# Patient Record
Sex: Female | Born: 1995 | Race: White | Hispanic: No | Marital: Single | Smoking: Never smoker
Health system: Southern US, Community
[De-identification: ages and names within clinical notes are randomized; demographics above are authoritative.]

## PROBLEM LIST (undated history)

## (undated) ENCOUNTER — Ambulatory Visit (HOSPITAL_COMMUNITY): Admission: EM | Payer: Self-pay

## (undated) DIAGNOSIS — N946 Dysmenorrhea, unspecified: Secondary | ICD-10-CM

## (undated) DIAGNOSIS — E669 Obesity, unspecified: Secondary | ICD-10-CM

## (undated) DIAGNOSIS — F909 Attention-deficit hyperactivity disorder, unspecified type: Secondary | ICD-10-CM

## (undated) DIAGNOSIS — Z975 Presence of (intrauterine) contraceptive device: Secondary | ICD-10-CM

## (undated) DIAGNOSIS — F419 Anxiety disorder, unspecified: Secondary | ICD-10-CM

## (undated) DIAGNOSIS — R61 Generalized hyperhidrosis: Secondary | ICD-10-CM

## (undated) DIAGNOSIS — F429 Obsessive-compulsive disorder, unspecified: Secondary | ICD-10-CM

## (undated) HISTORY — DX: Obesity, unspecified: E66.9

## (undated) HISTORY — DX: Anxiety disorder, unspecified: F41.9

## (undated) HISTORY — DX: Presence of (intrauterine) contraceptive device: Z97.5

## (undated) HISTORY — DX: Attention-deficit hyperactivity disorder, unspecified type: F90.9

## (undated) HISTORY — DX: Dysmenorrhea, unspecified: N94.6

## (undated) HISTORY — PX: BREAST SURGERY: SHX581

## (undated) HISTORY — DX: Generalized hyperhidrosis: R61

---
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
## 2017-07-12 DIAGNOSIS — R059 Cough, unspecified: Secondary | ICD-10-CM | POA: Insufficient documentation

## 2017-07-12 DIAGNOSIS — J0100 Acute maxillary sinusitis, unspecified: Secondary | ICD-10-CM | POA: Insufficient documentation

## 2017-07-12 HISTORY — DX: Acute maxillary sinusitis, unspecified: J01.00

## 2017-07-12 HISTORY — DX: Cough, unspecified: R05.9

## 2018-12-07 DIAGNOSIS — F419 Anxiety disorder, unspecified: Secondary | ICD-10-CM | POA: Insufficient documentation

## 2018-12-07 DIAGNOSIS — F902 Attention-deficit hyperactivity disorder, combined type: Secondary | ICD-10-CM | POA: Insufficient documentation

## 2019-06-14 ENCOUNTER — Other Ambulatory Visit: Payer: Self-pay

## 2019-06-14 ENCOUNTER — Ambulatory Visit (INDEPENDENT_AMBULATORY_CARE_PROVIDER_SITE_OTHER): Payer: 59 | Admitting: Psychiatry

## 2019-06-14 ENCOUNTER — Encounter: Payer: Self-pay | Admitting: Psychiatry

## 2019-06-14 VITALS — BP 122/90 | HR 84 | Ht 66.0 in | Wt 210.0 lb

## 2019-06-14 DIAGNOSIS — F902 Attention-deficit hyperactivity disorder, combined type: Secondary | ICD-10-CM | POA: Diagnosis not present

## 2019-06-14 DIAGNOSIS — F605 Obsessive-compulsive personality disorder: Secondary | ICD-10-CM

## 2019-06-14 DIAGNOSIS — F410 Panic disorder [episodic paroxysmal anxiety] without agoraphobia: Secondary | ICD-10-CM

## 2019-06-14 DIAGNOSIS — F411 Generalized anxiety disorder: Secondary | ICD-10-CM | POA: Diagnosis not present

## 2019-06-14 DIAGNOSIS — F429 Obsessive-compulsive disorder, unspecified: Secondary | ICD-10-CM | POA: Insufficient documentation

## 2019-06-14 MED ORDER — AMPHETAMINE-DEXTROAMPHETAMINE 20 MG PO TABS: 20.0000 mg | ORAL_TABLET | Freq: Every day | ORAL | 0 refills | Status: DC

## 2019-06-14 MED ORDER — ALPRAZOLAM 1 MG PO TABS: 1.0000 mg | ORAL_TABLET | Freq: Two times a day (BID) | ORAL | 1 refills | Status: DC | PRN

## 2019-06-14 MED ORDER — AMPHETAMINE-DEXTROAMPHET ER 25 MG PO CP24: 50.0000 mg | ORAL_CAPSULE | Freq: Every day | ORAL | 0 refills | Status: DC

## 2019-06-14 MED ORDER — FLUOXETINE HCL 40 MG PO CAPS: 80.0000 mg | ORAL_CAPSULE | Freq: Every day | ORAL | 2 refills | Status: DC
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
## 2019-09-14 ENCOUNTER — Other Ambulatory Visit: Payer: Self-pay

## 2019-09-14 ENCOUNTER — Encounter: Payer: Self-pay | Admitting: Psychiatry

## 2019-09-14 ENCOUNTER — Ambulatory Visit (INDEPENDENT_AMBULATORY_CARE_PROVIDER_SITE_OTHER): Payer: 59 | Admitting: Psychiatry

## 2019-09-14 VITALS — Ht 66.0 in | Wt 199.0 lb

## 2019-09-14 DIAGNOSIS — F410 Panic disorder [episodic paroxysmal anxiety] without agoraphobia: Secondary | ICD-10-CM | POA: Diagnosis not present

## 2019-09-14 DIAGNOSIS — F411 Generalized anxiety disorder: Secondary | ICD-10-CM

## 2019-09-14 DIAGNOSIS — F605 Obsessive-compulsive personality disorder: Secondary | ICD-10-CM

## 2019-09-14 DIAGNOSIS — F902 Attention-deficit hyperactivity disorder, combined type: Secondary | ICD-10-CM | POA: Diagnosis not present

## 2019-09-14 MED ORDER — AMPHETAMINE-DEXTROAMPHET ER 25 MG PO CP24: 50.0000 mg | ORAL_CAPSULE | Freq: Every day | ORAL | 0 refills | Status: DC

## 2019-09-14 MED ORDER — ALPRAZOLAM 1 MG PO TABS: 1.0000 mg | ORAL_TABLET | Freq: Two times a day (BID) | ORAL | 1 refills | Status: DC | PRN

## 2019-09-14 MED ORDER — AMPHETAMINE-DEXTROAMPHETAMINE 20 MG PO TABS: 20.0000 mg | ORAL_TABLET | Freq: Every day | ORAL | 0 refills | Status: DC

## 2019-09-14 MED ORDER — FLUOXETINE HCL 40 MG PO CAPS: 80.0000 mg | ORAL_CAPSULE | Freq: Every day | ORAL | 2 refills | Status: DC

## 2019-09-14 NOTE — Progress Notes (Signed)
[facility]  Patient ID: Donisha Hoch,  MRN: [id number]  PCP: Patient, No Pcp Per  Date of Evaluation: 09/14/2019 Time spent:20 minutes from 0905 to 0925  Chief Complaint:  Chief Complaint    Anxiety; Panic Attack; ADHD      HISTORY/CURRENT STATUS: Marlenne is seen onsite in office 20 minutes face-to-face individually with consent with epic collateral for psychiatric interview and exam in 62-month evaluation and management of generalized anxiety, ADHD/OCP, and panic disorder.  The patient's last appointment was very focused upon discomfort with change from previous treatment particularly her therapist.  Though she has not had a virtual session with her therapist in [place] in over a year, she still feels guilty that she should reestablish that therapy as an extension of her past rather than moving ahead.  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  She has tears as she discusses looking into therapy again stating she should do so but has been busy and felt quite well in the last 3 months. Parents in [place] are now more but modestly stressed by the patient's pharmacist sister older by 5 years moving to [place] near husband's family to start a family of her own.  The patient doubts she will have a serious relationship in 5 years.  She did not bring the 2015 psychological testing she expected to integrate into her care here from last session which is likely therapeutic even though having the information is always more complete.  She had a viral gastroenteritis for 1 week with 11 pound weight loss.  She is taking her medication regularly with [facility] documenting last dispensing on 08/28/2019 so that she has filled all of her descriptions from last appointment though she has cut down on Xanax not acknowledging that will please  her sister the pharmacist.  She has no mania, suicidality, psychosis or delirium.   Anxiety Presents for initial visit. Onset was more than 5 years ago. The problem has been waxing and waning. Symptoms include confusion, decreased concentration, excessive worry, muscle tension, nervous/anxious behavior, palpitations, panic and restlessness. Patient reports no depressed mood or suicidal ideas. Symptoms occur most days. The severity of symptoms is causing significant distress and moderate. The symptoms are aggravated by work stress, family issues and social activities. The quality of sleep is fair. Nighttime awakenings: occasional.   Risk factors include change in medication, prior traumatic experience, a major life event and family history. Her past medical history is significant for anxiety/panic attacks. There is no history of arrhythmia, bipolar disorder, hyperthyroidism or suicide attempts. Past treatments include benzodiazephines and SSRIs. The treatment provided mild relief. Compliance with prior treatments has been good. Prior compliance problems include medication issues and difficulty with treatment plan.    Individual Medical History/ Review of Systems: Changes? :Yes Weight dropped 11 pounds with vomiting and diarrhea for a week considered viral.  She is taking less Xanax than she thinks she might need to take but getting BiPAP  Allergies: Clarithromycin and Penicillins  Current Medications:  Current Outpatient Medications:  .  ALPRAZolam (XANAX) 1 MG tablet, Take 1 tablet (1 mg total) by mouth 2 (two) times daily as needed for anxiety (Panic)., Disp: 60 tablet, Rfl: 1 .  [START ON 09/27/2019] amphetamine-dextroamphetamine (ADDERALL XR) 25 MG 24 hr capsule, Take 2 capsules by mouth daily after breakfast., Disp: 60 capsule, Rfl:  0 .  [START ON 10/27/2019] amphetamine-dextroamphetamine (ADDERALL XR) 25 MG 24 hr capsule, Take 2 capsules by mouth daily after breakfast., Disp: 60 capsule, Rfl: 0 .   [START ON 11/26/2019] amphetamine-dextroamphetamine (ADDERALL XR) 25 MG 24 hr capsule, Take 2 capsules by mouth daily after breakfast., Disp: 60 capsule, Rfl: 0 .  [START ON 09/27/2019] amphetamine-dextroamphetamine (ADDERALL) 20 MG tablet, Take 1 tablet (20 mg total) by mouth daily before supper., Disp: 30 tablet, Rfl: 0 .  [START ON 10/27/2019] amphetamine-dextroamphetamine (ADDERALL) 20 MG tablet, Take 1 tablet (20 mg total) by mouth daily before supper., Disp: 30 tablet, Rfl: 0 .  [START ON 11/26/2019] amphetamine-dextroamphetamine (ADDERALL) 20 MG tablet, Take 1 tablet (20 mg total) by mouth daily before supper., Disp: 30 tablet, Rfl: 0 .  FLUoxetine (PROZAC) 40 MG capsule, Take 2 capsules (80 mg total) by mouth daily after breakfast., Disp: 60 capsule, Rfl: 2 .  Multiple Vitamin (MULTI-VITAMIN) tablet, Take by mouth., Disp: , Rfl:   Medication Side Effects: hypersomnolence  Family Medical/ Social History: Changes? No  MENTAL HEALTH EXAM:  Height 5\' 6"  (1.676 m), weight 199 lb (90.3 kg).Body mass index is 32.12 kg/m. Muscle strengths and tone 5/5, postural reflexes and gait 0/0, and AIMS = 0 otherwise deferred for coronavirus  General Appearance: Fairly Groomed, Guarded, Meticulous and Obese  Eye Contact:  Fair  Speech:  Clear and Coherent, Normal Rate and Talkative  Volume:  Normal  Mood:  Anxious, Dysphoric, Euthymic and Worthless  Affect:  Congruent, Inappropriate, Restricted and Anxious  Thought Process:  Coherent, Goal Directed, Irrelevant, Linear and Descriptions of Associations: Circumstantial  Orientation:  Full (Time, Place, and Person)  Thought Content: Ilusions, Obsessions and Rumination   Suicidal Thoughts:  No  Homicidal Thoughts:  No  Memory:  Immediate;   Good Remote;   Good  Judgement:  Fair  Insight:  Fair to limited  Psychomotor Activity:  Normal, Increased, Mannerisms and Restlessness  Concentration:  Concentration: Fair and Attention Span: Fair   Recall:  Good  Fund  of Knowledge: Good  Language: Good  Assets:  Leisure Time Resilience Talents/Skills  ADL's:  Intact  Cognition: WNL  Prognosis:  Good    DIAGNOSES:    ICD-10-CM   1. Generalized anxiety disorder  F41.1 FLUoxetine (PROZAC) 40 MG capsule    ALPRAZolam (XANAX) 1 MG tablet  2. Attention deficit hyperactivity disorder (ADHD), combined type, moderate  F90.2 amphetamine-dextroamphetamine (ADDERALL XR) 25 MG 24 hr capsule    amphetamine-dextroamphetamine (ADDERALL XR) 25 MG 24 hr capsule    amphetamine-dextroamphetamine (ADDERALL XR) 25 MG 24 hr capsule    amphetamine-dextroamphetamine (ADDERALL) 20 MG tablet    amphetamine-dextroamphetamine (ADDERALL) 20 MG tablet    amphetamine-dextroamphetamine (ADDERALL) 20 MG tablet  3. Panic disorder  F41.0 FLUoxetine (PROZAC) 40 MG capsule    ALPRAZolam (XANAX) 1 MG tablet  4. Obsessive compulsive personality disorder ([place])  F60.5 FLUoxetine (PROZAC) 40 MG capsule    Receiving Psychotherapy: No but agreeing to start whether with her former therapist in [place] or with Shanon Ace, LCSW here   RECOMMENDATIONS: Closure from past fixations is encouraged as adaptive moving forward and adult life is described.  The patient is doing quite well with work and adapting to the area, she doubts this can become fulfilling even over several years as she had observed such happening for her older sister including through her parents' eyes.  Maintaining current medication improvement of the last 3 months requiring less Xanax though still obtaining refill warrants continuing  as she plans to start therapy.Prozac is E scribed 40 mg capsule every morning after breakfast as #60 with 2 refills to [facility] [address] for generalized and panic anxiety and obsessive-compulsive.  She is E scribed Xanax 1 mg twice daily as needed for panic #60 with 1 refill also sent to [facility] for panic disorder and generalized anxiety.  She is E scribed Adderall 25 mg  XR every morning and 20 mg IR every mid-to-late afternoon has #30 each for March 8, April 7, and May 7 sent to [facility] for ADHD.  She returns for follow-up in 3 months or sooner if needed.   Chauncey Mann, MD

## 2019-10-11 ENCOUNTER — Telehealth: Payer: Self-pay

## 2019-10-11 NOTE — Telephone Encounter (Signed)
Contacted [facility] at [phone] for a prior authorization for ADDERALL XR 25 MG 2 DAILY, #60, approved effective 10/11/2019-10/10/2020. PA# [id number]   [facility] ID# [id number]

## 2019-10-25 ENCOUNTER — Other Ambulatory Visit: Payer: Self-pay

## 2019-10-25 ENCOUNTER — Ambulatory Visit (INDEPENDENT_AMBULATORY_CARE_PROVIDER_SITE_OTHER): Payer: 59 | Admitting: Psychiatry

## 2019-10-25 DIAGNOSIS — F411 Generalized anxiety disorder: Secondary | ICD-10-CM

## 2019-10-25 NOTE — Progress Notes (Signed)
[facility] Counselor Initial Adult Exam  Name: Mariah Young Date: 10/25/2019 MRN: [id number] DOB: Nov 26, 1995 PCP: Patient, No Pcp Per  Time spent: 60 minutes   8:00am to 9:00am  Guardian/Payee:  patient   Paperwork requested:  No   Reason for Visit /Presenting Problem/Symptoms: 24 yr old, single female presents anxiety (generalized), some depression, stressed, tearfulness, "nervous today as I've seen other therapists before.", some obsessive-compulsive tendencies and has had them before, stressed because I [place] prior therapist [place] 4 [place] as we became pretty close and I feel like I'm cheating on her by seeing another therapist here in [place].". Moved to [place] in September 2020. Single and living alone.  Due to pandemic, has not met many people and she feels she would have met others if not for Covid.  Parents retired in [place]. Has 1 older sister in [place]. Difficulty trusting people.   Mental Status Exam:   Appearance:   Neat     Behavior:  Appropriate and Sharing  Motor:  Normal  Speech/Language:   Clear and Coherent  Affect:  Anxious, some depression  Mood:  anxious, depressed and sad  Thought process:  normal  Thought content:    WNL  Sensory/Perceptual disturbances:    WNL  Orientation:  oriented to person, place, time/date, situation, day of week, month of year and year  Attention:  Good  Concentration:  Good  Memory:  WNL  Fund of knowledge:   Good  Insight:    Good  Judgment:   Good  Impulse Control:  Good   Reported Symptoms:  See symptoms above.   Risk Assessment: Danger to Self:  No Self-injurious Behavior: No Danger to Others: No Duty to Warn:no Physical Aggression / Violence:No  Access to Firearms a concern: No  Gang Involvement:No  Patient / guardian was educated about steps to take if suicide or homicide risk level increases between visits: Patient denies any SI or HI. While future psychiatric events cannot be accurately predicted,  the patient does not currently require acute inpatient psychiatric care and does not currently meet [facility] involuntary commitment criteria.  Substance Abuse History: Current substance abuse: No     Past Psychiatric History:   Previous psychological history is significant for ADHD, anxiety and depression Outpatient Providers: several different therapists in [place] History of Psych Hospitalization: No  Psychological Testing: none   Abuse History: Victim of No., n/a   Report needed: No. Victim of Neglect:No. Perpetrator of n/a  Witness / Exposure to Domestic Violence: No   Protective Services Involvement: No  Witness to [employer] Violence:  No   Family History:  Family History  Problem Relation Age of Onset  . Hypertension Mother   . Hypertension Father   . Colon cancer Maternal Grandfather   . Lung cancer Paternal Grandfather     Living situation: the patient lives alone with  Her "emotional support dog [name]"  Sexual Orientation:  Straight  Relationship Status: single  Name of spouse / other:--  n/a             If a parent, number of children / ages:--no children  Support Systems; parents lives alone 2 friends, 1 in [place] and 1 in [place]:  Yes .  Is hoping to go back to med school within 2 yrs.  Income/Employment/Disability: Employment  Armed forces logistics/support/administrative officer: No   Educational History: Education: Scientist, product/process development:   [employer]  Any cultural differences that may affect / interfere with treatment:  not applicable   Recreation/Hobbies: plants, cooking  Stressors:Educational concerns Financial difficulties  Strengths:  Family, Friends, Spirituality and Self Advocate  Barriers:  "myself", doubts about myself especially with ADHD and other problems, self-sabotage in not taking my meds regularly, giving too much at work and not having energy left for me (emotionally and physically exhausted.0    Legal  History: Pending legal issue / charges: The patient has no significant history of legal issues. History of legal issue / charges: none  Medical History/Surgical History:  Reviewed with patient and she confirms all medical info below is correct.   Past Medical History:  Diagnosis Date  . ADHD (attention deficit hyperactivity disorder)   . Anxiety   . Diaphoresis   . Dysmenorrhea   . IUD (intrauterine device) in place   . Obesity (BMI 30.0-34.9)     Past Surgical History:  Procedure Laterality Date  . BREAST LUMPECTOMY Right 2014  . BREAST SURGERY    . REDUCTION MAMMAPLASTY Bilateral 2013    Medications: Current Outpatient Medications  Medication Sig Dispense Refill  . ALPRAZolam (XANAX) 1 MG tablet Take 1 tablet (1 mg total) by mouth 2 (two) times daily as needed for anxiety (Panic). 60 tablet 1  . amphetamine-dextroamphetamine (ADDERALL XR) 25 MG 24 hr capsule Take 2 capsules by mouth daily after breakfast. 60 capsule 0  . [START ON 10/27/2019] amphetamine-dextroamphetamine (ADDERALL XR) 25 MG 24 hr capsule Take 2 capsules by mouth daily after breakfast. 60 capsule 0  . [START ON 11/26/2019] amphetamine-dextroamphetamine (ADDERALL XR) 25 MG 24 hr capsule Take 2 capsules by mouth daily after breakfast. 60 capsule 0  . amphetamine-dextroamphetamine (ADDERALL) 20 MG tablet Take 1 tablet (20 mg total) by mouth daily before supper. 30 tablet 0  . [START ON 10/27/2019] amphetamine-dextroamphetamine (ADDERALL) 20 MG tablet Take 1 tablet (20 mg total) by mouth daily before supper. 30 tablet 0  . [START ON 11/26/2019] amphetamine-dextroamphetamine (ADDERALL) 20 MG tablet Take 1 tablet (20 mg total) by mouth daily before supper. 30 tablet 0  . FLUoxetine (PROZAC) 40 MG capsule Take 2 capsules (80 mg total) by mouth daily after breakfast. 60 capsule 2  . Multiple Vitamin (MULTI-VITAMIN) tablet Take by mouth.     No current facility-administered medications for this visit.    Allergies  Allergen  Reactions  . Clarithromycin Other (See Comments) and Rash    Vasculitis Vasculitis   . Penicillins Other (See Comments)    Other reaction(s): Intolerance vasculitis vasculitis     Diagnoses:    ICD-10-CM   1. Generalized anxiety disorder  F41.1     Plan of Care:  Patient not signing tx plan on computer screen due to Covid.  Treatment Goals: Goals will remain on tx plan as patient works with strategies to achieve her goals. Progress will be noted each session and documented in "Progress" section of goal plan.   Long term goal: (Measurable) 1. Stabilize anxiety level while increasing ability to function on a daily basis.  2. Patient will eventually progress to where she rates her anxiety as a "3" or less on "1-10 Anxiety scale" for at least 2 months.  Short term goal: Increase understanding of beliefs and messages that produce anxiety, worry,fear, and negativity.   Strategies: Identify, challenge, and replace anxious/fearful/negative with positive, hopeful, and empoweriing self-talk.  Progress: This is patient's first session today and we worked collaboratively on her treatment goal plan as stated above.  She seems less anxious at session end and is motivated for  change but also fearful with some negativity.  To beging self-monitoring of her thoughts, especially making notes of her anxious, negative, self-defeating thoughts. To bring these to session with her next visit and will follow up at that point.  Was quite anxious today so wanted to offer her hope and some direction but not overwhelm her.   Next session within 2 weeks.   Shanon Ace, LCSW

## 2019-11-01 ENCOUNTER — Ambulatory Visit (INDEPENDENT_AMBULATORY_CARE_PROVIDER_SITE_OTHER): Payer: 59 | Admitting: Psychiatry

## 2019-11-01 ENCOUNTER — Other Ambulatory Visit: Payer: Self-pay

## 2019-11-01 DIAGNOSIS — F411 Generalized anxiety disorder: Secondary | ICD-10-CM

## 2019-11-01 NOTE — Progress Notes (Signed)
[facility] Counselor/Therapist Progress Note  Patient ID: Mariah Young, MRN: [id number],    Date: 11/01/2019  Time Spent: 60 minutes  9:00am to 10:00am  Treatment Type: Individual Therapy  Reported Symptoms: anxiety, depression, tearfulness, need to prioritize myself, wants to feel more hopeful  Mental Status Exam:  Appearance:   Neat     Behavior:  Appropriate and Sharing  Motor:  Normal  Speech/Language:   Normal Rate  Affect:  anxious, depressed, tearful  Mood:  anxious, depressed and trying to feel more hopeful  Thought process:  goal directed  Thought content:    WNL  Sensory/Perceptual disturbances:    WNL  Orientation:  oriented to person, place, time/date, situation, day of week, month of year and year  Attention:  Good/Fair  Concentration:  Good/Fair  Memory:  WNL  Fund of knowledge:   Good  Insight:    Good  Judgment:   Good  Impulse Control:  Good   Risk Assessment: Danger to Self:  No Self-injurious Behavior: No Danger to Others: No Duty to Warn:no Physical Aggression / Violence:No  Access to Firearms a concern: No  Gang Involvement:No   Subjective: Patient in today reporting anxiety, depression, feeling "stagnant ' more recently but have felt more hopeful since last appt here.  Interventions: Cognitive Behavioral Therapy and Solution-Oriented/Positive Psychology  Diagnosis:   ICD-10-CM   1. Generalized anxiety disorder  F41.1     Plan of Care:  Patient not signing tx plan on computer screen due to Covid.  Treatment Goals: Goals will remain on tx plan as patient works with strategies to achieve her goals. Progress will be noted each session and documented in "Progress" section of goal plan.   Long term goal: (Measurable) 1. Stabilize anxiety level while increasing ability to function on a daily basis.  2. Patient will eventually progress to where she rates her anxiety as a "3" or less on "1-10 Anxiety scale" for at least 2  months.  Short term goal: Increase understanding of beliefs and messages that produce anxiety, worry,fear, and negativity.   Strategies: Identify, challenge, and replace anxious/fearful/negative with positive, hopeful, and empoweriing self-talk.  Progress: Patient stated today she knows she needs to make herself a priority and recognize when she gives too much of herself to others and not having anything left for herself. Concerned about her loneliness.  Tearful intermittently in session. "Giving too much of myself" is something I've struggled with a long time and hold myself to very high, too high standards."  "I was raised that way."  Realizes she first needs to create boundaries with work and family.  Hasn't done this yet but planning to start this within this week--has daily opportunities to set good boundaries. Wants to take entrance exam and get into med school is her goal. Has "emotinal support dog, Kara Mead" which she is very attached to.  Confuses setting appropriate boundaries with being selfish.  Now starting to think about actually reaching out to get involved and meet people, which she has not been doing and has felt lonely.  Talked about specific boundaries to implement at work, to begin this week. Goal-related effort to better understand the beliefs and messages that are producing her anxiety, worries, fear, and negativity, and to continues this. Again states she has felt a little more hopeful since our first appt.   Goal review and progress noted with patient.  Next appt within 2 weeks.   Mathis Fare, LCSW

## 2019-11-24 ENCOUNTER — Other Ambulatory Visit: Payer: Self-pay

## 2019-11-24 ENCOUNTER — Ambulatory Visit (INDEPENDENT_AMBULATORY_CARE_PROVIDER_SITE_OTHER): Payer: 59 | Admitting: Psychiatry

## 2019-11-24 DIAGNOSIS — F411 Generalized anxiety disorder: Secondary | ICD-10-CM

## 2019-11-24 NOTE — Progress Notes (Signed)
[facility] Counselor/Therapist Progress Note  Patient ID: Mariah Young, MRN: [id number],    Date: 11/24/2019   Time Spent: 60 minutes  5:00pm to 6:00pm  Treatment Type: Individual Therapy  Reported Symptoms: anxiety, depression, some obsessive-compulsiveness  Mental Status Exam:  Appearance:   Well Groomed     Behavior:  Appropriate, Sharing and Motivated  Motor:  Normal  Speech/Language:   Normal Rate  Affect:  anxiety, depression  Mood:  anxious and depressed  Thought process:  normal  Thought content:    WNL  Sensory/Perceptual disturbances:    WNL  Orientation:  oriented to person, place, time/date, situation, day of week, month of year and year  Attention:  Good  Concentration:  Good and Fair  Memory:  WNL  Fund of knowledge:   Good  Insight:    Good  Judgment:   Good  Impulse Control:  Good   Risk Assessment: Danger to Self:  No Self-injurious Behavior: No Danger to Others: No Duty to Warn:no Physical Aggression / Violence:No  Access to Firearms a concern: No  Gang Involvement:No   Subjective: Patient today shares that she's struggling with anxiety, depression, and some OCD.   Interventions: Cognitive Behavioral Therapy and Ego-Supportive  Diagnosis:   ICD-10-CM   1. Generalized anxiety disorder  F41.1     Plan of Care: Patient not signing tx plan on computer screen due to Covid.  Treatment Goals: Goals will remain on tx plan as patient works with strategies to achieve her goals. Progress will be noted each session and documented in "Progress" section of goal plan.   Long term goal:(Measurable) 1.Stabilize anxiety level while increasing ability to function on a daily basis. 2. Patient will eventually progress to where she rates her anxiety as a "3" or less on "1-10 Anxiety scale" for at least 2 months.  Short term goal: Increase understanding of beliefs and messages that produce anxiety, worry,fear, and  negativity.  Strategies: Identify, challenge, and replace anxious/fearful/negative with positive, hopeful, and empoweriing self-talk.  Progress: Patient in today reporting anxiety, depression, and ocd.  Lots of work stresses and "today was horrible".  Tearful and explaining what all happened at work today, which were things out of her control.  Hard for her to let go of things out of her control.  Puts pressure on herself.  Very rigid in her views at times.  "I would rather be an hour early than a minute late." I need to learn to be ok at work when I can't fix everything there." " I need to save energy for myself and not overcommit." Was able to step back and see her tendency to get distracted from what she has said are her priorities. Also will sometimes get off "on a tangent". "I tend to also give too much of myself and not have much personal time,  And my standards are very high."  Today is quite rigid and again states "I was raised that way." Reviewed goals with her and tried to help get some joint clarify some of her needs and priorities a plan moving forward.  Patient is planning to bring in some notes on her phone next session re: priorities for her. Still reports feeling more hopeful within past few weeks but she can also sink quickly emotionally when things don't go as planned or go poorly.   .Goal review and progress/challenges noted with patient.  Next appt within 2 weeks.    Mathis Fare, LCSW

## 2019-12-08 ENCOUNTER — Other Ambulatory Visit: Payer: Self-pay

## 2019-12-08 ENCOUNTER — Ambulatory Visit (INDEPENDENT_AMBULATORY_CARE_PROVIDER_SITE_OTHER): Payer: 59 | Admitting: Psychiatry

## 2019-12-08 DIAGNOSIS — F411 Generalized anxiety disorder: Secondary | ICD-10-CM | POA: Diagnosis not present

## 2019-12-08 NOTE — Progress Notes (Signed)
[facility] Counselor/Therapist Progress Note  Patient ID: Mariah Young, MRN: [id number],    Date: 12/08/2019  Time Spent: 60 minutes  5:00pm to 6:00pm  Treatment Type: Individual Therapy  Reported Symptoms: anxiety, depression  Mental Status Exam:  Appearance:   Neat     Behavior:  Appropriate and Sharing  Motor:  Normal  Speech/Language:   Normal Rate  Affect:  anxiety, depression  Mood:  angry and depressed  Thought process:  normal  Thought content:    WNL  Sensory/Perceptual disturbances:    WNL  Orientation:  oriented to person, place, time/date, situation, day of week, month of year and year  Attention:  Good  Concentration:  Good and Fair  Memory:  WNL  Fund of knowledge:   Good  Insight:    Good  Judgment:   Good and Fair  Impulse Control:  Good and Fair   Risk Assessment: Danger to Self:  No Self-injurious Behavior: No Danger to Others: No Duty to Warn:no Physical Aggression / Violence:No  Access to Firearms a concern: No  Gang Involvement:No   Subjective: Patient in today with anxiety and depression. "Have lots to talk about today!"  Interventions: Cognitive Behavioral Therapy and Solution-Oriented/Positive Psychology  Diagnosis:   ICD-10-CM   1. Generalized anxiety disorder  F41.1     Plan of Care: Patient not signing tx plan on computer screen due to Covid.  Treatment Goals: Goals will remain on tx plan as patient works with strategies to achieve her goals. Progress will be noted each session and documented in "Progress" section of goal plan.   Long term goal:(Measurable) 1.Stabilize anxiety level while increasing ability to function on a daily basis. 2. Patient will eventually progress to where she rates her anxiety as a "3" or less on "1-10 Anxiety scale" for at least 2 months.  Short term goal: Increase understanding of beliefs and messages that produce anxiety, worry,fear, and negativity.  Strategies: Identify,  challenge, and replace anxious/fearful/negative with positive, hopeful, and empoweriing self-talk.  Progress: Patient in today with anxiety and depression. 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  Did make progress on "making friends goal" as a couple of other ladies that see her through her work, have asked her to get together sometime soon after work. Also reached out to a new neighbor welcoming her to the apt complex and suggested they get together sometime.  Is also working on setting better boundaries with work and not doing so much work on her own time.  Still needing/wanting to better recognize her stress over things out of her control, and be able to stop feeling stressed and instead focus on things within her control.   Discussed some of her extremely high standards for herself and at times unrealistic, as this frequently contributes to her heightened anxiety.  Processed some thoughts/feelings from her homework that relate to some of her rigidity and excessive expectations of self. Patient adds some new insight "I think I hold myself back because of fear of failure, and also I feel guilty for wanting more in my life when I know other people have it much worse.  Shared this at session end and we agreed to pick up on this next session and she is going to do some writing about it in the meantime. Encouraged good self-care including contact with others, boundary setting, and positive self-talk  Goal review and progress noted with patient.  Next appt within 2 weeks.   Shanon Ace, LCSW

## 2019-12-13 ENCOUNTER — Other Ambulatory Visit: Payer: Self-pay

## 2019-12-13 ENCOUNTER — Encounter: Payer: Self-pay | Admitting: Psychiatry

## 2019-12-13 ENCOUNTER — Ambulatory Visit (INDEPENDENT_AMBULATORY_CARE_PROVIDER_SITE_OTHER): Payer: 59 | Admitting: Psychiatry

## 2019-12-13 VITALS — Ht 66.0 in | Wt 186.0 lb

## 2019-12-13 DIAGNOSIS — F410 Panic disorder [episodic paroxysmal anxiety] without agoraphobia: Secondary | ICD-10-CM | POA: Diagnosis not present

## 2019-12-13 DIAGNOSIS — F902 Attention-deficit hyperactivity disorder, combined type: Secondary | ICD-10-CM

## 2019-12-13 DIAGNOSIS — F411 Generalized anxiety disorder: Secondary | ICD-10-CM | POA: Diagnosis not present

## 2019-12-13 DIAGNOSIS — F605 Obsessive-compulsive personality disorder: Secondary | ICD-10-CM

## 2019-12-13 MED ORDER — AMPHETAMINE-DEXTROAMPHET ER 25 MG PO CP24: 50.0000 mg | ORAL_CAPSULE | Freq: Every day | ORAL | 0 refills | Status: DC

## 2019-12-13 MED ORDER — FLUOXETINE HCL 40 MG PO CAPS: 80.0000 mg | ORAL_CAPSULE | Freq: Every day | ORAL | 2 refills | Status: DC

## 2019-12-13 MED ORDER — ALPRAZOLAM 1 MG PO TABS: 1.0000 mg | ORAL_TABLET | Freq: Two times a day (BID) | ORAL | 1 refills | Status: DC | PRN

## 2019-12-13 MED ORDER — AMPHETAMINE-DEXTROAMPHETAMINE 20 MG PO TABS: 20.0000 mg | ORAL_TABLET | Freq: Every day | ORAL | 0 refills | Status: DC

## 2019-12-13 NOTE — Progress Notes (Signed)
[facility]  Patient ID: Mariah Young,  MRN: [id number]  PCP: Patient, No Pcp Per  Date of Evaluation: 12/13/2019 Time spent:20 minutes from 0925 to 0945  Chief Complaint:  Chief Complaint    Anxiety; ADHD; Stress; Panic Attack      HISTORY/CURRENT STATUS: Mariah Young is seen onsite in office 20 minutes face-to-face individually with consent with epic collateral for psychiatric interview and exam in 17-month evaluation and management of generalized anxiety and ADHD/OCD.  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  She is effective in her job and has no complaints currently.  However, she looks forward to [employer] and seeking medical school after a couple of years of current work.  Sister remains in [place] and parents elsewhere in [place] with patient adapting to this area.  She is seeing Shanon Ace for therapy at least 4 visits since last seen by myself, last therapy appointment being May 19.  She may skip her Adderall on the weekends and variably needs 2 Xanax but is doing well on the increased Prozac 40 mg every morning also on a regular basis.  She has no mania, suicidality, psychosis or delirium.  [facility] documents last Adderall 25 mg XR to have been 08/02/2019 for dispensing and last Adderall 20mg  1 tablet in the afternoon to have been March 20.  We attempt to assure that she has sufficient medication fills in the pharmacy without confusing her regimen.  Anxiety  Presents for follow up visit. Onset was more than 6 years ago. The problem has been waxing and waning. Symptoms include decreased concentration, excessive worry, muscle tension, nervous/anxious behavior,  obsessive thoughts, compulsive routines, palpitations, prepanic and restlessness. Patient reports no depressed mood, confusion, or suicidal ideas. Symptoms occur  most days. The severity of symptoms is causing significant distress and moderate. The symptoms are aggravated by work stress, family issues and social activities. The quality of sleep is fair. Nighttime awakenings: occasional. Risk factors include change in medication, prior traumatic experience, a major life event and family history. Her past medical history is significant for anxiety/panic attacks. There is no history of arrhythmia, bipolar disorder, hyperthyroidism or suicide attempts. Past treatments include benzodiazephines and SSRIs. The treatment provided mild relief. Compliance with prior treatments has been good. Prior compliance problems include medication issues and difficulty with treatment plan.   Individual Medical History/ Review of Systems: Changes? :Yes Weight reduction of 13 pounds in 3 months is from diet and nutrition not Adderall  Allergies: Clarithromycin and Penicillins  Current Medications:  Current Outpatient Medications:  .  ALPRAZolam (XANAX) 1 MG tablet, Take 1 tablet (1 mg total) by mouth 2 (two) times daily as needed for anxiety (Panic)., Disp: 60 tablet, Rfl: 1 .  [START ON 12/30/2019] amphetamine-dextroamphetamine (ADDERALL XR) 25 MG 24 hr capsule, Take 2 capsules by mouth daily after breakfast., Disp: 60 capsule, Rfl: 0 .  [START ON 01/29/2020] amphetamine-dextroamphetamine (ADDERALL XR) 25 MG 24 hr capsule, Take 2 capsules by mouth daily after breakfast., Disp: 60 capsule, Rfl: 0 .  [START ON 02/28/2020] amphetamine-dextroamphetamine (ADDERALL XR) 25 MG 24 hr capsule, Take 2 capsules by mouth daily after breakfast., Disp: 60 capsule, Rfl: 0 .  amphetamine-dextroamphetamine (ADDERALL) 20 MG tablet, Take 1 tablet (20 mg total) by mouth daily before supper., Disp: 30 tablet, Rfl: 0 .  amphetamine-dextroamphetamine (ADDERALL) 20 MG tablet, Take 1 tablet (20  mg total) by mouth daily before supper., Disp: 30 tablet, Rfl: 0 .  amphetamine-dextroamphetamine (ADDERALL) 20 MG  tablet, Take 1 tablet (20 mg total) by mouth daily before supper., Disp: 30 tablet, Rfl: 0 .  FLUoxetine (PROZAC) 40 MG capsule, Take 2 capsules (80 mg total) by mouth daily after breakfast., Disp: 60 capsule, Rfl: 2 .  Multiple Vitamin (MULTI-VITAMIN) tablet, Take by mouth., Disp: , Rfl:   Medication Side Effects: none  Family Medical/ Social History: Changes? No  MENTAL HEALTH EXAM:  Height 5\' 6"  (1.676 m), weight 186 lb (84.4 kg).Body mass index is 30.02 kg/m. Muscle strengths and tone 5/5, postural reflexes and gait 0/0, and AIMS = 0.  General Appearance: Casual, Guarded, Meticulous and Well Groomed  Eye Contact:  Fair  Speech:  Clear and Coherent, Normal Rate and Talkative  Volume:  Normal  Mood:  Anxious and Euthymic  Affect:  Congruent, Inappropriate, Restricted and Anxious  Thought Process:  Coherent, Goal Directed, Irrelevant and Descriptions of Associations: Circumstantial  Orientation:  Full (Time, Place, and Person)  Thought Content: Obsessions and Rumination   Suicidal Thoughts:  No  Homicidal Thoughts:  No  Memory:  Immediate;   Good Remote;   Good  Judgement:  Fair  Insight:  Fair  Psychomotor Activity:  Normal, Increased and Mannerisms  Concentration:  Concentration: Good and Attention Span: Fair  Recall:  of Knowledge: Good  Language: Good  Assets:  Desire for Improvement Resilience Talents/Skills Vocational/Educational  ADL's:  Intact  Cognition: WNL  Prognosis:  Good    DIAGNOSES:    ICD-10-CM   1. Generalized anxiety disorder  F41.1 FLUoxetine (PROZAC) 40 MG capsule    ALPRAZolam (XANAX) 1 MG tablet  2. 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 Panic disorder  F41.0 FLUoxetine (PROZAC) 40 MG capsule    ALPRAZolam (XANAX) 1 MG tablet    Receiving Psychotherapy: Yes  with [facility] for therapy 4 visits in 3 months last therapy May 19   RECOMMENDATIONS: Psychosupportive psychoeducation integrates past and current therapy with symptom treatment matching medications to continue at her current dosing with prevention and monitoring safety hygiene.  She is to resume exercise.  She is E scribed Adderall 25 mg XR taking 2 capsules every morning sent as #60 each for June 10, July 10, and August 9 for ADHD and Adderall 20 mg IR daily later afternoon before supper as #30 having 2 fills unused at pharmacy from last appointment sent to [facility] for ADHD.  She is E scribed Prozac 40 mg capsule taking 2 capsules total 80 mg every morning sent as #60 with 2 refills to [facility] for OCD and generalized anxiety.  She is E scribed Xanax 1 mg twice daily as needed for anxiety and compulsions #60 with 1 refill sent to [facility].  She returns for follow-up in 3 months or sooner if needed.   [employer], MD

## 2019-12-22 ENCOUNTER — Other Ambulatory Visit: Payer: Self-pay

## 2019-12-22 ENCOUNTER — Ambulatory Visit (INDEPENDENT_AMBULATORY_CARE_PROVIDER_SITE_OTHER): Payer: 59 | Admitting: Psychiatry

## 2019-12-22 DIAGNOSIS — F411 Generalized anxiety disorder: Secondary | ICD-10-CM | POA: Diagnosis not present

## 2019-12-22 NOTE — Progress Notes (Signed)
      [facility] Counselor/Therapist Progress Note  Patient ID: Trystan Eads, MRN: [id number],    Date: 12/22/2019  Time Spent: 60 minutes   4:00pm to 5:00pm  Treatment Type: Individual Therapy  Reported Symptoms: "anxiety is the strongest", depression, some obsessiveness  Mental Status Exam:  Appearance:   Well Groomed     Behavior:  Appropriate, Sharing and Motivated  Motor:  Normal  Speech/Language:   Normal Rate  Affect:  anxious, depressed  Mood:  anxious and depressed  Thought process:  goal directed  Thought content:    WNL  Sensory/Perceptual disturbances:    WNL  Orientation:  oriented to person, place, time, day, year, month,   Attention:  Good/Fair  Concentration:  Good and Fair  Memory:  WNL  Fund of knowledge:   Good  Insight:    Good  Judgment:   Good  Impulse Control:  Good   Risk Assessment: Danger to Self:  No Self-injurious Behavior: No Danger to Others: No Duty to Warn:no Physical Aggression / Violence:No  Access to Firearms a concern: No  Gang Involvement:No   Subjective: Patient today reports anxiety as her strongest symptom, and also some depression.   Interventions: Cognitive Behavioral Therapy and Solution-Oriented/Positive Psychology  Diagnosis:   ICD-10-CM   1. Generalized anxiety disorder  F41.1      Plan of Care: Patient not signing tx plan on computer screen due to Covid.  Treatment Goals: Goals will remain on tx plan as patient works with strategies to achieve her goals. Progress will be noted each session and documented in "Progress" section of goal plan.   Long term goal:(Measurable) 1.Stabilize anxiety level while increasing ability to function on a daily basis. 2. Patient will eventually progress to where she rates her anxiety as a "3" or less on "1-10 Anxiety scale" for at least 2 months.  Short term goal: Increase understanding of beliefs and messages that produce anxiety, worry,fear, and  negativity.  Strategies: Identify, challenge, and replace anxious/fearful/negative with positive, hopeful, and empoweriing self-talk.  Progress: Patient in today with anxiety and depression, with anxiety being the stronger symptom. Reports work situations have worsened.  Following up on her goal of making friends, patient reports she did go out recently with newer friends she met through work. Has had some difficulty taking meds regularly and for specific reasons and to speak with Dr.Jennings next visit about this.  Her issue with meds does not relate to safety concerns. Had to work some overtime recently due to being on call with work but shared that she is committed to establishing clearer boundaries with work hours unless it's an emergency. Reports some progress in focusing more on what she can control verus those she can't control. Working to change her extremely high standards and expectations of herself that are very unrealistic and lead to chronic frustration. Targeted strategy above for her to begin to identify , challenge, and replace anxious thoughts with more reality-based, positive, hopeful and empowering self-talk that does not support anxiety nor self-negating.  To work on this more between sessions.  Goal review and progress noted with patient.  Next appt within 2 weeks.   Shanon Ace, LCSW

## 2020-01-05 ENCOUNTER — Other Ambulatory Visit: Payer: Self-pay

## 2020-01-05 ENCOUNTER — Ambulatory Visit (INDEPENDENT_AMBULATORY_CARE_PROVIDER_SITE_OTHER): Payer: 59 | Admitting: Psychiatry

## 2020-01-05 DIAGNOSIS — F411 Generalized anxiety disorder: Secondary | ICD-10-CM

## 2020-01-05 NOTE — Progress Notes (Signed)
[facility] Counselor/Therapist Progress Note  Patient ID: Mariah Young, MRN: [id number],    Date: 01/05/2020  Time Spent:  60 minutes   5:00pm to 6:00pm  Treatment Type: Individual Therapy  Reported Symptoms: anxiety, obsessiveness,   Mental Status Exam:  Appearance:   Casual     Behavior:  Appropriate and Sharing  Motor:  Normal  Speech/Language:   Normal Rate  Affect:  anxious   Mood:  anxious, depressed and some obsessiveness  Thought process:  goal directed  Thought content:    some obsessiveness  Sensory/Perceptual disturbances:    WNL  Orientation:  oriented to person, place, time/date, situation, day of week, month of year and year  Attention:  Fair  Concentration:  Fair  Memory:  some forgetfulness  Fund of knowledge:   Good  Insight:    Fair  Judgment:   Good and Fair  Impulse Control:  Fair   Risk Assessment: Danger to Self:  No Self-injurious Behavior: No Danger to Others: No Duty to Warn:no Physical Aggression / Violence:No  Access to Firearms a concern: No  Gang Involvement:No   Subjective: Patient in today with anxiety, obsessiveness, and some depression. Came in today very teaful and upset.  Interventions: Cognitive Behavioral Therapy, Solution-Oriented/Positive Psychology and Ego-Supportive  Diagnosis:   ICD-10-CM   1. Generalized anxiety disorder  F41.1      Plan of Care: Patient not signing tx plan on computer screen due to Covid.  Treatment Goals: Goals will remain on tx plan as patient works with strategies to achieve her goals. Progress will be noted each session and documented in "Progress" section of goal plan.   Long term goal:(Measurable) 1.Stabilize anxiety level while increasing ability to function on a daily basis. 2. Patient will eventually progress to where she rates her anxiety as a "3" or less on "1-10 Anxiety scale" for at least 2 months.  Short term goal: Increase understanding of beliefs and messages  that produce anxiety, worry,fear, and negativity.  Strategies: Identify, challenge, and replace anxious/fearful/negative with positive, hopeful, and empoweriing self-talk.  Progress: Patient  In today very upset, tearful, saying she was on edge of a major panic attack for multiple reasons. Encouraged her to talk more and explain to me what she's feeling and what's been going on, and she did. Tearfully talked about her job stress and her not setting healthy boundaries, not setting limits, ends up feeling helpless. Work situations discussed and they have worsened some. Interpersonal issues are complicating matters for her. She has not followed through yet on any setting of boundaries "because there's always reasons why I can't."  Focused more on helping her become calmer again. We talked about what patient can do most immediately to support herself and she agreed to the followng:  Refrain from re-playing work situations in her mind especially tonight, allow some additional time to get much needed sleep, take her meds as prescribed, spend time with her dog (a big emotional support for patient), do some things she might enjoy such as reading or going for a walk, and refrain from any negative self-talk. Also to pay attention to anxious thoughts and practice as we have before, interrupting them, challenging them and then replacing them with realistic, positive, and empowering thoughts that do not lead to anxiety. May also use journaling as a too to help with her thoughts and feelings.   Goal review and progress noted.  Next appt within 2 weeks.    Mathis Fare, LCSW

## 2020-01-12 ENCOUNTER — Telehealth: Payer: Self-pay | Admitting: Psychiatry

## 2020-01-12 NOTE — Telephone Encounter (Signed)
[facility] sends advisory from their monitoring of patient's medications that noncompliance with dosing appears likely however the cause

## 2020-01-19 ENCOUNTER — Ambulatory Visit (INDEPENDENT_AMBULATORY_CARE_PROVIDER_SITE_OTHER): Payer: 59 | Admitting: Psychiatry

## 2020-01-19 ENCOUNTER — Other Ambulatory Visit: Payer: Self-pay

## 2020-01-19 DIAGNOSIS — F411 Generalized anxiety disorder: Secondary | ICD-10-CM | POA: Diagnosis not present

## 2020-01-19 NOTE — Progress Notes (Signed)
      [facility] Counselor/Therapist Progress Note  Patient ID: Mariah Young, MRN: [id number],    Date: 01/19/2020  Time Spent: 60 minutes  5:00pm to 6:00pm  Treatment Type: Individual Therapy  Reported Symptoms: anxiety, depression  Mental Status Exam:  Appearance:   Neat     Behavior:  Appropriate and Sharing  Motor:  Normal  Speech/Language:   Clear and Coherent  Affect:  anxiety  Mood:  anxious and some depression  Thought process:  goal directed  Thought content:    WNL  Sensory/Perceptual disturbances:    WNL  Orientation:  oriented to person, place, time/date, situation, day of week, month of year and year  Attention:  Good  Concentration:  Good and Fair  Memory:  WNL  Fund of knowledge:   Good  Insight:    Fair  Judgment:   Good and Fair  Impulse Control:  Good and Fair   Risk Assessment: Danger to Self:  No Self-injurious Behavior: No Danger to Others: No Duty to Warn:no Physical Aggression / Violence:No  Access to Firearms a concern: No  Gang Involvement:No   Subjective: Patient in today reporting anxiety and some depression.  Past couple weeks have been overwhelming at times with work and little time for self.    Interventions: Cognitive Behavioral Therapy and Solution-Oriented/Positive Psychology  Diagnosis:   ICD-10-CM   1. Generalized anxiety disorder  F41.1     Plan of Care: Patient not signing tx plan on computer screen due to Covid.  Treatment Goals: Goals will remain on tx plan as patient works with strategies to achieve her goals. Progress will be noted each session and documented in "Progress" section of goal plan.   Long term goal:(Measurable) 1.Stabilize anxiety level while increasing ability to function on a daily basis. 2. Patient will eventually progress to where she rates her anxiety as a "3" or less on "1-10 Anxiety scale" for at least 2 months.  Short term goal: Increase understanding of beliefs and messages that  produce anxiety, worry,fear, and negativity.  Strategies: Identify, challenge, and replace anxious/fearful/negative with positive, hopeful, and empoweriing self-talk.  Progress: Patient int today reporting anxiety and depression, with anxiety being worse due to work and personal concerns. Very frustrated today with" work situations and mixed messages there." Difficulties in communications with admin at work. Is concerned she feel she is not getting support nor clear information.  Processed a lot of her work concerns and frustrations without judgment which seemed helpful to patient. Realizing she needs to set better boundaries and we discussed some options for her to set some effective boundaries at work, and also personally. Did use some time today to also process how her last session went when she was very anxious and she was able to share that things did get resolved and "I was just having a bad afternoon anyway that day."  Some tearfulness off an on but states she is some better than last week.  Is looking at some better ways of managing her stress at work and alternate ways of communication more effectively on the job with peers and Admin. States she needs to really work on the things we've spoken about today rather thatn talking about them and then not following through in action.  Goal review and progress/challenges noted with patient.  Next appt within 2 weeks.   Mathis Fare, LCSW

## 2020-02-03 ENCOUNTER — Other Ambulatory Visit: Payer: Self-pay

## 2020-02-03 ENCOUNTER — Ambulatory Visit (INDEPENDENT_AMBULATORY_CARE_PROVIDER_SITE_OTHER): Payer: 59 | Admitting: Psychiatry

## 2020-02-03 DIAGNOSIS — F411 Generalized anxiety disorder: Secondary | ICD-10-CM

## 2020-02-03 NOTE — Progress Notes (Signed)
      [facility] Counselor/Therapist Progress Note  Patient ID: Mariah Young, MRN: [id number],    Date: 02/03/2020  Time Spent: 60 minutes   5:00pm to 6:00pm  Treatment Type: Individual Therapy  Reported Symptoms: anxiety, depression, "some ocd mostly at work"  Mental Status Exam:  Appearance:   Neat     Behavior:  Appropriate and Sharing  Motor:  Normal  Speech/Language:   Clear and Coherent  Affect:  anxiety, some ocd, depression  Mood:  anxious and depressed  Thought process:  goal directed  Thought content:    WNL and some obsessiveness  Sensory/Perceptual disturbances:    WNL  Orientation:  oriented to person, place, time/date, situation, day of week, month of year and year  Attention:  Fair  Concentration:  Fair  Memory:  WNL  Fund of knowledge:   Good  Insight:    Good and Fair  Judgment:   Fair  Impulse Control:  Fair   Risk Assessment: Danger to Self:  No Self-injurious Behavior: No Danger to Others: No Duty to Warn:no Physical Aggression / Violence:No  Access to Firearms a concern: No  Gang Involvement:No   Subjective: Patient today reporting "anxiety, depression, and some ocd at work".    Interventions: Cognitive Behavioral Therapy and Solution-Oriented/Positive Psychology  Diagnosis:   ICD-10-CM   1. Generalized anxiety disorder  F41.1     Plan of Care: Patient not signing tx plan on computer screen due to Covid.  Treatment Goals: Goals will remain on tx plan as patient works with strategies to achieve her goals. Progress will be noted each session and documented in "Progress" section of goal plan.   Long term goal:(Measurable) 1.Stabilize anxiety level while increasing ability to function on a daily basis. 2. Patient will eventually progress to where she rates her anxiety as a "3" or less on "1-10 Anxiety scale" for at least 2 months.  Short term goal: Increase understanding of beliefs and messages that produce anxiety, worry,fear,  and negativity.  Strategies: Identify, challenge, and replace anxious/fearful/negative with positive, hopeful, and empoweriing self-talk.  Progress: Patient reports increased anxiety and some depression and apprehension with work situations. New situations have emerged and she still doesn't feel she's getting clarity in some things. Patient finding it hard to trust at work, is struggling not to assume the worst, and looking for what may go wrong versus right. Processed the work situations that are really weighing in on her and leading to increased anxiety, self-doubt, overthinking, some depression, and some ocd symptoms. Patient tearful intermittently, angry, frustrated and having difficulty trusting others at work. Talked through these concerns, looking at what she can control an what she can't, and trying to decrease her overthinking and looking for the negatives versus positives. Worked also on the strategy above that involved identifying, challenting, and replacing the anxious/fearful/depressive/negative thoughts with more positive, hopeful, reality-based, and empowering thoughts and self-talk which does not support anxiety/depression/fearfulness/negativity.  To continue practicing this strategy between sessions and to begin establishing healthier boundaries at work.   Goal review and progress/challenges noted with patient.  Next appt within 2 weeks.   Mathis Fare, LCSW

## 2020-02-06 ENCOUNTER — Other Ambulatory Visit: Payer: Self-pay

## 2020-02-06 ENCOUNTER — Encounter (HOSPITAL_COMMUNITY): Payer: Self-pay

## 2020-02-06 ENCOUNTER — Emergency Department (HOSPITAL_COMMUNITY)
Admission: EM | Admit: 2020-02-06 | Discharge: 2020-02-06 | Disposition: A | Discharge status: Home / Self Care | Payer: 59 | Attending: Emergency Medicine | Admitting: Emergency Medicine

## 2020-02-06 DIAGNOSIS — Y92010 Kitchen of single-family (private) house as the place of occurrence of the external cause: Secondary | ICD-10-CM | POA: Insufficient documentation

## 2020-02-06 DIAGNOSIS — F902 Attention-deficit hyperactivity disorder, combined type: Secondary | ICD-10-CM | POA: Diagnosis not present

## 2020-02-06 DIAGNOSIS — S61213A Laceration without foreign body of left middle finger without damage to nail, initial encounter: Secondary | ICD-10-CM

## 2020-02-06 DIAGNOSIS — Y93G1 Activity, food preparation and clean up: Secondary | ICD-10-CM | POA: Insufficient documentation

## 2020-02-06 DIAGNOSIS — S6992XA Unspecified injury of left wrist, hand and finger(s), initial encounter: Secondary | ICD-10-CM | POA: Diagnosis present

## 2020-02-06 DIAGNOSIS — Y998 Other external cause status: Secondary | ICD-10-CM | POA: Insufficient documentation

## 2020-02-06 DIAGNOSIS — Z79899 Other long term (current) drug therapy: Secondary | ICD-10-CM | POA: Insufficient documentation

## 2020-02-06 DIAGNOSIS — W260XXA Contact with knife, initial encounter: Secondary | ICD-10-CM | POA: Insufficient documentation

## 2020-02-06 MED ORDER — LIDOCAINE-EPINEPHRINE 1 %-1:100000 IJ SOLN
10.0000 mL | Freq: Once | INTRAMUSCULAR | Status: AC
  Administered 2020-02-06: 10 mL
  Filled 2020-02-06: qty 1

## 2020-02-06 NOTE — ED Notes (Signed)
Pt verbalized understanding of discharge instructions. Follow up care reviewed, pt had no further questions. 

## 2020-02-06 NOTE — ED Provider Notes (Signed)
[facility] EMERGENCY DEPARTMENT Provider Note   CSN: [id number] Arrival date & time: 02/06/20  1947     History Chief Complaint  Patient presents with   Laceration    Mariah Young is a 24 y.o. female.  HPI  Patient is a 24 year old female presented today with laceration to her left middle finger.  She states she cut her finger with a very sharp knife at approximately 6:30 PM-roughly 1 hour ago-while cooking.  She states that she immediately started bleeding and states that there is significant "spewing" of blood.  She states that there is not much blood on the floor however.  She states she came immediately to the emergency department.  She states that she has 5/10 pain that is achy, moderate, with no associated numbness or weakness.  She states she can still move the finger.  She states that she had a last tetanus vaccine 1 year ago.     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     Family History  Problem Relation Age of Onset   Hypertension Mother    Hypertension Father    Colon cancer Maternal Grandfather    Lung cancer Paternal Grandfather     Social History   Tobacco Use   Smoking status: Never Smoker   Smokeless tobacco: Never Used  Vaping Use   Vaping Use: Never used  Substance Use Topics    Alcohol use: Yes   Drug use: Never    Home Medications Prior to Admission medications   Medication Sig Start Date End Date Taking? Authorizing Provider  ALPRAZolam Prudy Feeler) 1 MG tablet Take 1 tablet (1 mg total) by mouth 2 (two) times daily as needed for anxiety (Panic). 12/13/19   Chauncey Mann, MD  amphetamine-dextroamphetamine (ADDERALL XR) 25 MG 24 hr capsule Take 2 capsules by mouth daily after breakfast. 12/30/19 01/29/20  Chauncey Mann, MD  amphetamine-dextroamphetamine (ADDERALL XR) 25 MG 24 hr capsule Take 2 capsules by mouth daily after breakfast. 01/29/20 02/28/20  Chauncey Mann, MD  amphetamine-dextroamphetamine (ADDERALL XR) 25 MG 24 hr capsule Take 2 capsules by mouth daily after breakfast. 02/28/20 03/29/20  Chauncey Mann, MD  amphetamine-dextroamphetamine (ADDERALL) 20 MG tablet Take 1 tablet (20 mg total) by mouth daily before supper. 10/27/19 11/26/19  Chauncey Mann, MD  amphetamine-dextroamphetamine (ADDERALL) 20 MG tablet Take 1 tablet (20 mg total) by mouth daily before supper. 11/26/19 12/26/19  Chauncey Mann, MD  amphetamine-dextroamphetamine (ADDERALL) 20 MG tablet Take 1 tablet (20 mg total) by mouth daily before supper. 12/13/19 01/12/20  Chauncey Mann, MD  FLUoxetine (PROZAC) 40 MG capsule Take 2 capsules (80 mg total) by mouth daily after breakfast. 12/13/19   Chauncey Mann, MD  Multiple Vitamin (MULTI-VITAMIN) tablet Take by mouth.    [provider]  Allergies    Clarithromycin and Penicillins  Review of Systems   Review of Systems  Constitutional: Negative for fever.  HENT: Negative for congestion.   Respiratory: Negative for shortness of breath.   Cardiovascular: Negative for chest pain.  Gastrointestinal: Negative for abdominal distention.  Skin: Positive for wound.  Neurological: Negative for dizziness and headaches.    Physical Exam Updated Vital Signs BP 124/84 (BP Location: Right Arm)    Pulse 98    Temp 98.4 F (36.9 C)  (Oral)    Resp 16    SpO2 99%   Physical Exam Vitals and nursing note reviewed.  Constitutional:      General: She is not in acute distress.    Appearance: Normal appearance. She is not ill-appearing.  HENT:     Head: Normocephalic and atraumatic.  Eyes:     General: No scleral icterus.       Right eye: No discharge.        Left eye: No discharge.     Conjunctiva/sclera: Conjunctivae normal.  Cardiovascular:     Rate and Rhythm: Normal rate.  Pulmonary:     Effort: Pulmonary effort is normal.     Breath sounds: No stridor.  Skin:    General: Skin is warm and dry.     Capillary Refill: Capillary refill takes less than 2 seconds.     Comments: Patient has small superficial skin avulsion with active bleeding to the left middle finger on the pad. The wound is not deep.  Not on the laceration and is well visualized.  Skin pad is still attached.  Neurological:     Mental Status: She is alert and oriented to person, place, and time. Mental status is at baseline.     Comments: Sensation intact in all fingertips.     ED Results / Procedures / Treatments   Labs (all labs ordered are listed, but only abnormal results are displayed) Labs Reviewed - No data to display  EKG None  Radiology No results found.  Procedures .Marland KitchenLaceration Repair  Date/Time: 02/06/2020 11:34 PM Performed by: Gailen Shelter, PA Authorized by: Gailen Shelter, PA   Consent:    Consent obtained:  Verbal   Consent given by:  Patient   Risks discussed:  Infection, need for additional repair, pain, poor cosmetic result and poor wound healing   Alternatives discussed:  No treatment and delayed treatment Universal protocol:    Procedure explained and questions answered to patient or proxy's satisfaction: yes     Relevant documents present and verified: yes     Test results available and properly labeled: yes     Imaging studies available: yes     Required blood products, implants, devices, and special  equipment available: yes     Site/side marked: yes     Immediately prior to procedure, a time out was called: yes     Patient identity confirmed:  Verbally with patient Anesthesia (see MAR for exact dosages):    Anesthesia method:  Local infiltration   Local anesthetic:  Lidocaine 1% WITH epi Laceration details:    Location:  Finger   Finger location:  L long finger   Length (cm):  1 Exploration:    Hemostasis achieved with:  Direct pressure and epinephrine   Wound extent: no foreign bodies/material noted and no tendon damage noted     Contaminated: no   Treatment:    Area cleansed with:  Saline   Amount of cleaning:  Standard  Irrigation solution:  Sterile saline   Irrigation method:  Pressure wash   Visualized foreign bodies/material removed: no   Skin repair:    Repair method:  Steri-Strips   Number of Steri-Strips:  1 Approximation:    Approximation:  Close Post-procedure details:    Dressing:  Antibiotic ointment and non-adherent dressing   Patient tolerance of procedure:  Tolerated well, no immediate complications Comments:     Epinephrine was used locally to stop bleeding.  Pressure was applied and bulky bandage was applied as well. Single steri strip used for wound closure.    (including critical care time)  Medications Ordered in ED Medications  lidocaine-EPINEPHrine (XYLOCAINE W/EPI) 1 %-1:100000 (with pres) injection 10 mL (10 mLs Infiltration Given 02/06/20 2029)    ED Course  I have reviewed the triage vital signs and the nursing notes.  Pertinent labs & imaging results that were available during my care of the patient were reviewed by me and considered in my medical decision making (see chart for details).    MDM Rules/Calculators/A&P                          Pressure irrigation performed. Wound explored and base of wound visualized in a bloodless field without evidence of foreign body.  Laceration occurred < 8 hours prior to repair -- no need for repair  at this time--did repair with one steristrip. Tdap up to date.  Pt has no comorbidities to effect normal wound healing. Pt discharged without antibiotics.  Discussed suture home care with patient and answered questions. Pt to follow-up for wound check and suture removal in 7 days; they are to return to the ED sooner for signs of infection. Pt is hemodynamically stable with no complaints prior to dc.   Shared decision-making conversation with patient about whether to use sutured repair.  Given the pros and cons patient states that she would prefer to have Steri-Strip placed.  This is more of an avulsion than a true laceration.  Will apply bulky bandage and gave patient wound care recommendations and follow-up with PCP.  Final Clinical Impression(s) / ED Diagnoses Final diagnoses:  Laceration of left middle finger without foreign body without damage to nail, initial encounter    Rx / DC Orders ED Discharge Orders    None       Gailen Shelter, [place] 02/06/20 2340    Geoffery Lyons, MD 02/08/20 [phone]

## 2020-02-06 NOTE — Discharge Instructions (Addendum)
Please keep bandage in place for the next 12 hours.  Tomorrow morning you may remove the bandage and inspect the wound.  I suspect that the area of skin well likely fall off with time however at this point it is a good bandage/layer to protect the skin.  Keep clean and dry.  Please use Tylenol or ibuprofen for pain.  You may use 600 mg ibuprofen every 6 hours or 1000 mg of Tylenol every 6 hours.  You may choose to alternate between the 2.  This would be most effective.  Not to exceed 4 g of Tylenol within 24 hours.  Not to exceed 3200 mg ibuprofen 24 hours.

## 2020-02-06 NOTE — ED Triage Notes (Signed)
Onset 6:30p pt cut left middle finger while cooking.  Reports has tourniquet on finger d/t "spewing blood".

## 2020-02-17 ENCOUNTER — Ambulatory Visit (INDEPENDENT_AMBULATORY_CARE_PROVIDER_SITE_OTHER): Payer: 59 | Admitting: Psychiatry

## 2020-02-17 ENCOUNTER — Other Ambulatory Visit: Payer: Self-pay

## 2020-02-17 DIAGNOSIS — F411 Generalized anxiety disorder: Secondary | ICD-10-CM | POA: Diagnosis not present

## 2020-02-17 NOTE — Progress Notes (Signed)
      [facility] Counselor/Therapist Progress Note  Patient ID: Kambree Krauss, MRN: [id number],    Date: 02/17/2020  Time Spent: 60 minutes   5:00pm to 6:00pm  Treatment Type: Individual Therapy  Reported Symptoms: anxiety, ocd, depression  Mental Status Exam:  Appearance:   Casual     Behavior:  Appropriate, Sharing and Motivated  Motor:  Normal  Speech/Language:   Clear and Coherent  Affect:  anxious, some depression  Mood:  anxious and depressed  Thought process:  goal directed  Thought content:    WNL  Sensory/Perceptual disturbances:    WNL  Orientation:  oriented to person, place, time/date, situation, day of week, month of year and year  Attention:  Fair  Concentration:  Fair  Memory:  WNL  Fund of knowledge:   Good  Insight:    Good  Judgment:   Good  Impulse Control:  Fair   Risk Assessment: Danger to Self:  No Self-injurious Behavior: No Danger to Others: No Duty to Warn:no Physical Aggression / Violence:No  Access to Firearms a concern: No  Gang Involvement:No   Subjective: Patient today reporting anxiety, some depression, "ocd"  Interventions: Cognitive Behavioral Therapy and Solution-Oriented/Positive Psychology  Diagnosis:   ICD-10-CM   1. Generalized anxiety disorder  F41.1     Plan of Care: Patient not signing tx plan on computer screen due to Covid.  Treatment Goals: Goals will remain on tx plan as patient works with strategies to achieve her goals. Progress will be noted each session and documented in "Progress" section of goal plan.   Long term goal:(Measurable) 1.Stabilize anxiety level while increasing ability to function on a daily basis. 2. Patient will eventually progress to where she rates her anxiety as a "3" or less on "1-10 Anxiety scale" for at least 2 months.  Short term goal: Increase understanding of beliefs and messages that produce anxiety, worry,fear, and negativity.  Strategies: Identify, challenge, and  replace anxious/fearful/negative with positive, hopeful, and empoweriing self-talk.  Progress: Patient in today reporting anxiety, some depression, and some obsessiveness. Does feel she has a better sense about her job and people involved , "who I can trust and who I can't."  Shared recent stressors at work and seems some less negatively impacted by things out of her control at work. Still having some trust issues with some co-workers.  Talked more today about problem areas at work that tend to result in patient overthinking, exacerbates my ocd, increases my self-doubt and anxiety/depressive feelings. Intermittent tearfulness and she process more of work situation. Having increase in anxious/negative/depressive and encouraged her  to work more with the strategy of identifying those problematic thoughts and replacing them with more positive, reality-based, and empowering thoughts and self-talk that do not support anxiety/depression/negativity.  Has shown some progress with this strategy and needing to build on this.  Goal review and progress noted with patient.  Next appt within 2 weeks.   Mathis Fare, LCSW

## 2020-03-02 ENCOUNTER — Other Ambulatory Visit: Payer: Self-pay

## 2020-03-02 ENCOUNTER — Ambulatory Visit (INDEPENDENT_AMBULATORY_CARE_PROVIDER_SITE_OTHER): Payer: 59 | Admitting: Psychiatry

## 2020-03-02 DIAGNOSIS — F411 Generalized anxiety disorder: Secondary | ICD-10-CM

## 2020-03-02 NOTE — Progress Notes (Signed)
      [facility] Counselor/Therapist Progress Note  Patient ID: Mariah Young, MRN: [id number],    Date: 03/02/2020  Time Spent: 60 minutes  4:00pm to 5:00pm  Treatment Type: Individual Therapy  Reported Symptoms: anxiety  Mental Status Exam:  Appearance:   Neat     Behavior:  Appropriate and Sharing  Motor:  Normal  Speech/Language:   Clear and Coherent  Affect:  anxious  Mood:  anxious  Thought process:  goal directed  Thought content:    WNL and some obessiveness  Sensory/Perceptual disturbances:    WNL  Orientation:  oriented to person, place, time/date, situation, day of week, month of year and year  Attention:  Good/Fair  Concentration:  Good/Fair  Memory:  WNL  Fund of knowledge:   Good  Insight:    Good and Fair  Judgment:   Good  Impulse Control:  Fair   Risk Assessment: Danger to Self:  No Self-injurious Behavior: No Danger to Others: No Duty to Warn:no Physical Aggression / Violence:No  Access to Firearms a concern: No  Gang Involvement:No   Subjective: Patient today reports anxiousness, even small things and hard to let go. "Too concerned with other people's perceptions and approval of me." Feel alone at times.  Interventions: Cognitive Behavioral Therapy, Solution-Oriented/Positive Psychology and Ego-Supportive  Diagnosis:   ICD-10-CM   1. Generalized anxiety disorder  F41.1      Plan of Care: Patient not signing tx plan on computer screen due to Covid.  Treatment Goals: Goals will remain on tx plan as patient works with strategies to achieve her goals. Progress will be noted each session and documented in "Progress" section of goal plan.   Long term goal:(Measurable) 1.Stabilize anxiety level while increasing ability to function on a daily basis. 2. Patient will eventually progress to where she rates her anxiety as a "3" or less on "1-10 Anxiety scale" for at least 2 months.  Short term goal: Increase understanding of beliefs and  messages that produce anxiety, worry,fear, and negativity.  Strategies: Identify, challenge, and replace anxious/fearful/negative with positive, hopeful, and empoweriing self-talk.  Progress: Patient in today reporting anxiety, some feelings of aloneness. "Let others dictate how I feel and I worry how they feel about me."  Discussed this during session today and she questions herself and decisions she makes. Having multiple problems at work and openly talked about them and her concerns in session today.  States "I've been told I'm not a team player", my confidence is low, it matters too much what others think of me.  Shares more about the reports of her not being a team player at work, and actually confronted one of the people involved, which did not go very well.  Patient reports she is trying to move beyond all this and "just focus on my job".  Still struggling with "who I can trust and who I can't trust." Discussed some of her feelings of anxiety, worry, fear, and negativity in short term goal above (in tx plan), and to try more identification, challenging, and replacing the anxious/fearful/negative thoughts with encouraging, hopeful, and empowering thoughts and self-talk.  Wants to do some journaling on this between now and next session and plans to bring it in with her.   Goal review and progress/challenges noted with patient.  Next appt within 2-3 weeks.   Mathis Fare, LCSW

## 2020-03-13 ENCOUNTER — Encounter: Payer: Self-pay | Admitting: Psychiatry

## 2020-03-13 ENCOUNTER — Other Ambulatory Visit: Payer: Self-pay

## 2020-03-13 ENCOUNTER — Ambulatory Visit (INDEPENDENT_AMBULATORY_CARE_PROVIDER_SITE_OTHER): Payer: 59 | Admitting: Psychiatry

## 2020-03-13 VITALS — Ht 66.0 in | Wt 173.0 lb

## 2020-03-13 DIAGNOSIS — F605 Obsessive-compulsive personality disorder: Secondary | ICD-10-CM

## 2020-03-13 DIAGNOSIS — F902 Attention-deficit hyperactivity disorder, combined type: Secondary | ICD-10-CM | POA: Diagnosis not present

## 2020-03-13 DIAGNOSIS — F410 Panic disorder [episodic paroxysmal anxiety] without agoraphobia: Secondary | ICD-10-CM

## 2020-03-13 DIAGNOSIS — F411 Generalized anxiety disorder: Secondary | ICD-10-CM

## 2020-03-13 MED ORDER — AMPHETAMINE-DEXTROAMPHET ER 25 MG PO CP24: 50.0000 mg | ORAL_CAPSULE | Freq: Every day | ORAL | 0 refills | Status: DC

## 2020-03-13 MED ORDER — DESVENLAFAXINE SUCCINATE ER 25 MG PO TB24: 25.0000 mg | ORAL_TABLET | Freq: Every day | ORAL | 1 refills | Status: DC

## 2020-03-13 NOTE — Progress Notes (Signed)
[facility] Med Check  Patient ID: Mariah Young,  MRN: [id number]  PCP: Patient, No Pcp Per  Date of Evaluation: 03/13/2020 Time spent:25 minutes from 0900 to 0925  Chief Complaint:  Chief Complaint    Anxiety; ADHD; Panic Attack      HISTORY/CURRENT STATUS: Mariah Young is seen onsite in office 25 minutes face-to-face individually with consent with epic collateral for psychiatric interview and exam in 57-month evaluation and management of generalized anxiety, panic disorder, and ADHD/OCD.  In the interim 3 months she has a 13 pound weight reduction after 24 pound reduction before that since first appointment 9 months ago for total reduction of 37 pounds.  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  She has had more panic and has therefore used more Xanax 1 mg as needed.  Adderall is still helpful and necessary but she suspects that Adderall is the cause of her reduced sex drive taking 25 mg XR in the morning and 20 mg IR in the afternoon. She has thus reduced use not taking the afternoon dose very often. As sexual dysfuntion persists, she reports losing weight primarily by eating healthy rather than Adderall, though she is not exercising significantly.  She avoids carbohydrates.  Her Prozac was increased from 40 to 80 mg by Dr. Rito Ehrlich prior to her transferring care here as she moved to [place]. [place] registry documents last dispensing of Xanax on 02/17/2020 and Adderall XR 02/18/2020 and XR 02/24/2020. She has no mania, suicidality, psychosis or delirium.  She is working effectively in psychotherapy and states her therapist has helped her be able to talk about the reduced sex drive when she thought she would just wait to address until medical school, but she is now anxiously wanting to  rectify her problems in general.  Anxiety             Presents forfollow upof anxiety onset more than 6 years ago. The problem has beenwaxing and waning. Current associated symptoms includedecreased concentration,excessive worry,muscle tension,somatic psychosexual symptoms, nervous/anxious behavior,obsessive thoughts, compulsive routines, palpitations,and restlessness. Patient reports nodepressed mood, confusion,mania, or suicidal ideas. Symptoms occurmost days. The severity of symptoms iscausing significant distress and moderate. The symptoms are aggravated bywork stress, family issues and social activities. The quality of sleep isfair. Nighttime awakenings:occasional. Risk factors includechange in medication, prior traumatic experience, a major life event and family history. Her past medical history is significant foranxiety/panic attacks. There is no history ofarrhythmia,bipolar disorder,hyperthyroidismor suicide attempts. Past treatments includebenzodiazephines and SSRIs. The treatment providedmildrelief. Compliance with prior treatments has beengood. Prior compliance problems includemedication issues and difficulty with treatment plan.  Individual Medical History/ Review of Systems: Changes? :Yes 37 pound weight reduction and 9 months of care as she is continued Adderall unchanged having doubled the dose of Prozac primarily attributing it to eating fewer carbohydrates.  She required a single Steri-Strip for left middle finger laceration occurring with a sharp knife while cooking 5 weeks ago treated in the ED with no evidence of intentional self injury.  Allergies: Clarithromycin and Penicillins  Current Medications:  Current Outpatient Medications:  .  ALPRAZolam (XANAX) 1 MG tablet, Take 1 tablet (1 mg total) by mouth 2 (two) times daily as needed for anxiety (Panic)., Disp: 60 tablet, Rfl: 1 .  amphetamine-dextroamphetamine (ADDERALL XR) 25 MG 24 hr capsule, Take  2  capsules by mouth daily after breakfast., Disp: 60 capsule, Rfl: 0 .  amphetamine-dextroamphetamine (ADDERALL XR) 25 MG 24 hr capsule, Take 2 capsules by mouth daily after breakfast., Disp: 60 capsule, Rfl: 0 .  amphetamine-dextroamphetamine (ADDERALL XR) 25 MG 24 hr capsule, Take 2 capsules by mouth daily after breakfast., Disp: 60 capsule, Rfl: 0 .  amphetamine-dextroamphetamine (ADDERALL) 20 MG tablet, Take 1 tablet (20 mg total) by mouth daily before supper., Disp: 30 tablet, Rfl: 0 .  amphetamine-dextroamphetamine (ADDERALL) 20 MG tablet, Take 1 tablet (20 mg total) by mouth daily before supper., Disp: 30 tablet, Rfl: 0 .  amphetamine-dextroamphetamine (ADDERALL) 20 MG tablet, Take 1 tablet (20 mg total) by mouth daily before supper., Disp: 30 tablet, Rfl: 0 .  desvenlafaxine 25 MG TB24, Take 25 mg by mouth daily after breakfast., Disp: 30 tablet, Rfl: 1 .  Multiple Vitamin (MULTI-VITAMIN) tablet, Take by mouth., Disp: , Rfl:   Medication Side Effects: sexual dysfunction  Family Medical/ Social History: Changes? No previously noting some distant relative with mental health concerns.  MENTAL HEALTH EXAM:  Height 5\' 6"  (1.676 m), weight 173 lb (78.5 kg).Body mass index is 27.92 kg/m. Muscle strengths and tone 5/5, postural reflexes and gait 0/0, and AIMS = 0.  General Appearance: Casual, Guarded, Meticulous and Well Groomed  Eye Contact:  Fair  Speech:  Clear and Coherent, Normal Rate and Talkative  Volume:  Normal  Mood:  Anxious, Euthymic and Irritable  Affect:  Congruent, Inappropriate, Restricted and Anxious  Thought Process:  Coherent, Goal Directed, Irrelevant and Descriptions of Associations: Circumstantial  Orientation:  Full (Time, Place, and Person)  Thought Content: Obsessions and Rumination   Suicidal Thoughts:  No  Homicidal Thoughts:  No  Memory:  Immediate;   Good Remote;   Good  Judgement:  Fair  Insight:  Fair  Psychomotor Activity:  Normal, Increased and  Mannerisms  Concentration:  Concentration: Good and Attention Span: Fair  Recall:  of Knowledge: Good  Language: Good  Assets:  Desire for Improvement Intimacy Resilience Talents/Skills  ADL's:  Intact  Cognition: WNL  Prognosis:  Good    DIAGNOSES:    ICD-10-CM   1. Generalized anxiety disorder  F41.1 desvenlafaxine 25 MG TB24  2. Attention deficit hyperactivity disorder (ADHD), combined type, moderate  F90.2 amphetamine-dextroamphetamine (ADDERALL XR) 25 MG 24 hr capsule  3. Obsessive compulsive personality disorder (HCC)  F60.5 desvenlafaxine 25 MG TB24  4. Panic disorder  F41.0 desvenlafaxine 25 MG TB24    Receiving Psychotherapy: Yes with [employer], LCSW   RECOMMENDATIONS: Psychosupportive psychoeducation integrates the evolving confidence for revelations of patient in therapy with symptom treatment matching for medication clarifying the likelihood that high dose Prozac and anxiety over need for Adderall contribute as much or more to sexual dysfunction as the Adderall.Though we can certainly change the Adderall, the preparation for such may determine the need by first changing the high dose Prozac for anxiety. Prozac is discontinued with option of 40 mg once daily for a week though long lasting metabolite accomplishes gradual taper pharmcodynamically. After one week of Prozac reduction, she will start Pristiq 25 mg every morning after breakfast escribed #30 with 1 refill to [facility] Fare for generalized and panic anxiety and OCD/ADHD.  She needs 1 interim fill of Adderall 25 mg XR every morning sent as #30 to [facility] for ADHD.  She has current supply of Xanax 1 mg twice daily as needed for panic or  high anxiety/obsessionality. Upcoming closure for my retirement is processed and prepared as she will return for follow up in 4 weeks.  Chauncey Mann, MD

## 2020-03-16 ENCOUNTER — Ambulatory Visit (INDEPENDENT_AMBULATORY_CARE_PROVIDER_SITE_OTHER): Payer: 59 | Admitting: Psychiatry

## 2020-03-16 ENCOUNTER — Other Ambulatory Visit: Payer: Self-pay

## 2020-03-16 DIAGNOSIS — F411 Generalized anxiety disorder: Secondary | ICD-10-CM | POA: Diagnosis not present

## 2020-03-16 NOTE — Progress Notes (Signed)
[facility] Counselor/Therapist Progress Note  Patient ID: Mariah Young, MRN: [id number],    Date: 03/16/2020  Time Spent: 60 minutes 5:00pm to 6:00pm  Treatment Type: Individual Therapy  Reported Symptoms: anxiety  Mental Status Exam:  Appearance:   Casual     Behavior:  Appropriate and Sharing  Motor:  Normal  Speech/Language:   Clear and Coherent  Affect:  anxious  Mood:  anxious  Thought process:  goal directed  Thought content:    some obsessiveness  Sensory/Perceptual disturbances:    WNL  Orientation:  oriented to person, place, time/date, situation, day of week, month of year and year  Attention:  Fair  Concentration:  Fair  Memory:  WNL  Fund of knowledge:   Good  Insight:    Good and Fair  Judgment:   Fair  Impulse Control:  Fair   Risk Assessment: Danger to Self:  No Self-injurious Behavior: No Danger to Others: No Duty to Warn:no Physical Aggression / Violence:No  Access to Firearms a concern: No  Gang Involvement:No   Subjective: Patient today reports anxiety, tired, and overwhelmed at work.  Realizing "I'm in the wrong career path."  Interventions: Cognitive Behavioral Therapy and Solution-Oriented/Positive Psychology  Diagnosis:   ICD-10-CM   1. Generalized anxiety disorder  F41.1     Plan of Care: Patient not signing tx plan on computer screen due to Covid.  Treatment Goals: Goals will remain on tx plan as patient works with strategies to achieve her goals. Progress will be noted each session and documented in "Progress" section of goal plan.   Long term goal:(Measurable) 1.Stabilize anxiety level while increasing ability to function on a daily basis. 2. Patient will eventually progress to where she rates her anxiety as a "3" or less on "1-10 Anxiety scale" for at least 2 months.  Short term goal: Increase understanding of beliefs and messages that produce anxiety, worry,fear, and negativity.  Strategies: Identify,  challenge, and replace anxious/fearful/negative with positive, hopeful, and empoweriing self-talk.  Progress: Patient in today looking stressed, tired and states she has been feeling anxiety, having more difficulties at work. Overwhelmed at work.  Not feeling she is in right career and not feeling fulfilled. Tearful and talking through some very difficult feelings, but seemed to reach more clarity about fact she does want to change career paths as she has already been contemplating.  Reports she has been trying to "not let others dictate how she is feeling as much" and that "I need to put my own self more in the front and take care of me rather than always revolve around what others want."  More problems at work with relationships and  "double standards"."I'm trying to be a team player but it's not working well.  Reports low self confidence and self-doubt, and trust issues.   Anxious thoughts have increased. On 1-10 anxiety scale, she self-rates as a "7-8" at her worst point today.  She did eventually use Xanax to calm herself some. Worked during session on some of her repetitive anxious thoughts using treatment goals and strategy to more quickly identify, challenge and replace them with more self-affirming, positive, and reality based thoughts that do not feed anxiety and negativity.  Patient is to continue working on this between sessions.  She did seem more grounded and purposeful by session end.  Goal review and progress/challenges noted with patient.  Next appt within 2 weeks.   Mathis Fare, LCSW

## 2020-03-30 ENCOUNTER — Ambulatory Visit (INDEPENDENT_AMBULATORY_CARE_PROVIDER_SITE_OTHER): Payer: 59 | Admitting: Psychiatry

## 2020-03-30 ENCOUNTER — Other Ambulatory Visit: Payer: Self-pay

## 2020-03-30 DIAGNOSIS — F411 Generalized anxiety disorder: Secondary | ICD-10-CM

## 2020-03-30 NOTE — Progress Notes (Signed)
[facility] Counselor/Therapist Progress Note  Patient ID: Mariah Young, MRN: [id number],    Date: 03/30/2020  Time Spent: 60 minutes  5:00pm to 6:00pm   Treatment Type: Individual Therapy  Reported Symptoms: anxiety, depression  Mental Status Exam:  Appearance:   Neat     Behavior:  Appropriate and Sharing  Motor:  Normal  Speech/Language:   Clear and Coherent  Affect:  anxious, depressed  Mood:  anxious and depressed  Thought process:  normal  Thought content:    some obsessiveness  Sensory/Perceptual disturbances:    WNL  Orientation:  oriented to person, place, time/date, situation, day of week, month of year and year  Attention:  Good  Concentration:  Good and Fair  Memory:  WNL  Fund of knowledge:   Good  Insight:    Good and Fair  Judgment:   Good  Impulse Control:  Good and Fair   Risk Assessment: Danger to Self:  No Self-injurious Behavior: No Danger to Others: No Duty to Warn:no Physical Aggression / Violence:No  Access to Firearms a concern: No  Gang Involvement:No   Subjective: Patient today reports anxiety and depression. Overwhelmed especially at work and having been away several days.  Interventions: Cognitive Behavioral Therapy, Solution-Oriented/Positive Psychology and Ego-Supportive  Diagnosis:   ICD-10-CM   1. Generalized anxiety disorder  F41.1      Plan of Care: Patient not signing tx plan on computer screen due to Covid.  Treatment Goals: Goals will remain on tx plan as patient works with strategies to achieve her goals. Progress will be noted each session and documented in "Progress" section of goal plan.   Long term goal:(Measurable) 1.Stabilize anxiety level while increasing ability to function on a daily basis. 2. Patient will eventually progress to where she rates her anxiety as a "3" or less on "1-10 Anxiety scale" for at least 2 months.  Short term goal: Increase understanding of beliefs and messages that  produce anxiety, worry,fear, and negativity.  Strategies: Identify, challenge, and replace anxious/fearful/negative with positive, hopeful, and empoweriing self-talk.  Progress: Patient in today reporting depression and anxiety, and some overwhelmedness mostly about work. Also reports that this is the one yr anniversary. Tendency to be hard on herself.  Is in midst of changing meds with Dr. Marlyne Beards, and notices some changes in the midst of the med changes. Tearfully expressed concerns about sensitive issue with a friend and discussed it at length in session today. (Not all information included in this note due to patient privacy needs.)  Struggling with some low self-worth and guilt issues which we worked on for a good part of the session.  Patient was more grounded by session and and is to do some written homework in between sessions, per her request, for the writing to be about her priorities and some of the things she wants in life moving forward.  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  Anxiety today, she rates herself as a "6" on a 1-10 scale.  Encouraged patient in her self-talk and making it positive and more self-affirming, while working to make good decisions, practice some self forgiveness, and be looking for more positives versus negatives.  Goal review and progress/challenges noted with patient.  Next appt within 2-3 weeks.   Mathis Fare, LCSW

## 2020-04-06 ENCOUNTER — Other Ambulatory Visit: Payer: Self-pay | Admitting: Psychiatry

## 2020-04-06 DIAGNOSIS — F410 Panic disorder [episodic paroxysmal anxiety] without agoraphobia: Secondary | ICD-10-CM

## 2020-04-06 DIAGNOSIS — F411 Generalized anxiety disorder: Secondary | ICD-10-CM

## 2020-04-07 NOTE — Telephone Encounter (Signed)
[place] Registry and Epic determine no contraindication to Xanax in last year last dispensing late July needing refill for interim to appt next week.

## 2020-04-07 NOTE — Telephone Encounter (Signed)
Apt 04/10/20

## 2020-04-10 ENCOUNTER — Ambulatory Visit (INDEPENDENT_AMBULATORY_CARE_PROVIDER_SITE_OTHER): Payer: 59 | Admitting: Psychiatry

## 2020-04-10 ENCOUNTER — Other Ambulatory Visit: Payer: Self-pay

## 2020-04-10 ENCOUNTER — Encounter: Payer: Self-pay | Admitting: Psychiatry

## 2020-04-10 VITALS — Ht 66.0 in | Wt 168.0 lb

## 2020-04-10 DIAGNOSIS — F411 Generalized anxiety disorder: Secondary | ICD-10-CM | POA: Diagnosis not present

## 2020-04-10 DIAGNOSIS — F410 Panic disorder [episodic paroxysmal anxiety] without agoraphobia: Secondary | ICD-10-CM | POA: Diagnosis not present

## 2020-04-10 DIAGNOSIS — F422 Mixed obsessional thoughts and acts: Secondary | ICD-10-CM

## 2020-04-10 DIAGNOSIS — F902 Attention-deficit hyperactivity disorder, combined type: Secondary | ICD-10-CM | POA: Diagnosis not present

## 2020-04-10 MED ORDER — AMPHETAMINE-DEXTROAMPHETAMINE 20 MG PO TABS: 20.0000 mg | ORAL_TABLET | Freq: Every day | ORAL | 0 refills | Status: DC

## 2020-04-10 MED ORDER — AMPHETAMINE-DEXTROAMPHET ER 25 MG PO CP24: 50.0000 mg | ORAL_CAPSULE | Freq: Every day | ORAL | 0 refills | Status: DC

## 2020-04-10 MED ORDER — DESVENLAFAXINE SUCCINATE ER 50 MG PO TB24: 50.0000 mg | ORAL_TABLET | Freq: Every day | ORAL | 1 refills | Status: DC

## 2020-04-10 MED ORDER — ALPRAZOLAM 1 MG PO TABS: 1.0000 mg | ORAL_TABLET | Freq: Two times a day (BID) | ORAL | 1 refills | Status: DC | PRN

## 2020-04-10 NOTE — Progress Notes (Signed)
[facility] Med Check  Patient ID: Zainah Steven,  MRN: [id number]  PCP: Patient, No Pcp Per  Date of Evaluation: 04/10/2020 Time spent:25 minutes 0900 to 0925  Chief Complaint:  Chief Complaint    Anxiety; Panic Attack; ADHD; Sexual Problem      HISTORY/CURRENT STATUS: Chauncey is seen onsite in office 25 minutes face-to-face individually with consent with epic collateral for psychiatric interview and exam in 79-month evaluation and management of panic and generalized anxiety disorders, ADHD, and previous consideration of obsessive-compulsive personality now more clearly obsessive-compulsive disorder.  This is the fifth appointment in 10 months by patient who initially presented as having left [place] to get over the disappointing college and sorority relationships as well as then leaving [place] where parents left for the same.  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  She has lost 41 pounds down another 5 pounds in the last month getting physically in shape working hard and attending therapy now every 2 weeks.  She continues her Adderall and has changed over from Prozac to Pristiq with significant clearing of sexual dysfunction side effects not just due to anxiety and significant clarification of obsessive-compulsive symptoms being more OCD rather than obsessive-compulsive personality.  Character function has steadily improved as all other diagnoses have been stabilized.  She picked up Adderall tablets today needed on the job like she did recent capsules from the pharmacy .  She needs more Xanax and requests to increase Pristiq which was started in place of the 80 mg of Prozac last appointment 1 month ago as the patient was likely having side effects as well as lack of efficacy.  She reviews options for Pristiq dosing particularly relevant to comparison  with equivalents of Prozac so that we will target 50 mg to prevent side effects but consider 75 mg next if needed.  Anxiety Presents forfollow upof anxiety onset more than6years ago. The problem has beenwaxing and waning. Current associated symptoms includedecreased concentration,excessive worry,muscle tension,resolving psychosexual side effects, nervous/anxious behavior,obsessive thoughts, compulsiveroutines, palpitations,and restlessness. Patient reports nodelirious features, depressed mood,misperceptions, confusion,dissociation, mania, or suicidal ideas. Symptoms occurmost days. The severity of symptoms ismoderate. The symptoms are aggravated bywork stress, family issues and social activities. The quality of sleep isfair. Nighttime awakenings:occasional. Risk factors includechange in medication, prior traumatic experience, a major life event and family history. Her past medical history is significant foranxiety/panic attacks. There is no history ofarrhythmia,bipolar disorder,hyperthyroidismor suicide attempts. Past treatments includebenzodiazephines and SSRIs. The treatment providedmildrelief. Compliance with prior treatments has beengood. Prior compliance problems includemedication issues and difficulty with treatment plan.  Individual Medical History/ Review of Systems: Changes? :Yes  weight is down 41 pounds in 10 months including 5 pounds in the last 4 weeks as sexual side effects appear to have likely been the high dose of Prozac at 80 mg daily better on the lower dose of Pristiq except needing more Pristiq medication for anxiety.  Allergies: Clarithromycin and Penicillins  Current Medications:  Current Outpatient Medications:  .  ALPRAZolam (XANAX) 1 MG tablet, Take 1 tablet (1 mg total) by mouth 2 (two) times daily as needed for anxiety., Disp: 60 tablet, Rfl: 1 .  amphetamine-dextroamphetamine (ADDERALL XR) 25 MG 24 hr capsule, Take 2 capsules by  mouth daily after breakfast., Disp: 60 capsule, Rfl: 0 .  amphetamine-dextroamphetamine (ADDERALL XR) 25 MG 24 hr capsule,  Take 2 capsules by mouth daily after breakfast., Disp: 60 capsule, Rfl: 0 .  [START ON 05/10/2020] amphetamine-dextroamphetamine (ADDERALL XR) 25 MG 24 hr capsule, Take 2 capsules by mouth daily after breakfast., Disp: 60 capsule, Rfl: 0 .  amphetamine-dextroamphetamine (ADDERALL) 20 MG tablet, Take 1 tablet (20 mg total) by mouth daily before supper., Disp: 30 tablet, Rfl: 0 .  amphetamine-dextroamphetamine (ADDERALL) 20 MG tablet, Take 1 tablet (20 mg total) by mouth daily before supper., Disp: 30 tablet, Rfl: 0 .  [START ON 05/10/2020] amphetamine-dextroamphetamine (ADDERALL) 20 MG tablet, Take 1 tablet (20 mg total) by mouth daily before supper., Disp: 30 tablet, Rfl: 0 .  desvenlafaxine (PRISTIQ) 50 MG 24 hr tablet, Take 1 tablet (50 mg total) by mouth daily., Disp: 30 tablet, Rfl: 1 .  Multiple Vitamin (MULTI-VITAMIN) tablet, Take by mouth., Disp: , Rfl:   Medication Side Effects: none  Family Medical/ Social History: Changes? No  MENTAL HEALTH EXAM:  Height 5\' 6"  (1.676 m), weight 168 lb (76.2 kg).Body mass index is 27.12 kg/m. Muscle strengths and tone 5/5, postural reflexes and gait 0/0, and AIMS = 0.  General Appearance: Casual, Meticulous and Well Groomed  Eye Contact:  Fair  Speech:  Clear and Coherent, Normal Rate and Talkative  Volume:  Normal  Mood:  Anxious and Euthymic  Affect:  Congruent, Inappropriate, Restricted and Anxious  Thought Process:  Coherent, Goal Directed, Irrelevant, Linear and Descriptions of Associations: Circumstantial  Orientation:  Full (Time, Place, and Person)  Thought Content: Obsessions and Rumination   Suicidal Thoughts:  No  Homicidal Thoughts:  No  Memory:  Immediate;   Good Remote;   Good  Judgement:  Good  Insight:  Good  Psychomotor Activity:  Normal and Mannerisms  Concentration:  Concentration: Fair and Attention  Span: Fair  Recall:  of Knowledge: Good  Language: Good  Assets:  Desire for Improvement Intimacy Resilience Talents/Skills  ADL's:  Intact  Cognition: WNL  Prognosis:  Good    DIAGNOSES:    ICD-10-CM   1. Generalized anxiety disorder  F41.1 ALPRAZolam (XANAX) 1 MG tablet    desvenlafaxine (PRISTIQ) 50 MG 24 hr tablet  2. Panic disorder  F41.0 ALPRAZolam (XANAX) 1 MG tablet    desvenlafaxine (PRISTIQ) 50 MG 24 hr tablet  3. Attention deficit hyperactivity disorder (ADHD), combined type, moderate  F90.2 amphetamine-dextroamphetamine (ADDERALL XR) 25 MG 24 hr capsule    amphetamine-dextroamphetamine (ADDERALL XR) 25 MG 24 hr capsule    amphetamine-dextroamphetamine (ADDERALL) 20 MG tablet    amphetamine-dextroamphetamine (ADDERALL) 20 MG tablet    desvenlafaxine (PRISTIQ) 50 MG 24 hr tablet  4. Mixed obsessional thoughts and acts  F42.2 ALPRAZolam (XANAX) 1 MG tablet    desvenlafaxine (PRISTIQ) 50 MG 24 hr tablet    Receiving Psychotherapy: Yes  with [employer], LCSW every 2 weeks   RECOMMENDATIONS: Psychosupportive psychoeducation integrates cognitive behavioral nutrition, sleep hygiene, social skills and frustration tolerance with symptom treatment managing for medications concluding to increase Pristiq replacing Prozac and currently continuing other medications the same except consider at next appointment reducing the Adderall from 50 to 30 mg XR in AM though she has been at the 50 mg since she first came here last November not changing her dose in the interim until Prozac was replaced by Pristiq.  She is E scribed Adderall 25 mg XR capsule to take 2 capsules total 50 mg every morning sent as #60 each for September 20 and October 20 to October 22  College at [address] for ADHD.  She is E scribed Adderall 20 mg IR tablet as 1 in mid to late afternoon #30 with no refill to [facility] for ADHD.  Pristiq is increased to 50 mg every  morning sent as a 30-day supply and 1 refill for OCD, panic disorder, and generalized anxiety sent to [facility].  She is E scribed Xanax 1 mg twice daily as needed for panic or high anxiety or obsessional fixation sent as #60 with 1 refill to [facility] for OCD, generalized anxiety, and panic disorder.  She completes closure for my imminent retirement transferring care to [facility], DNP for 05/08/2020 at 8 AM.  She is updated on prevention and monitoring safety hygiene.   Chauncey Mann, MD

## 2020-04-13 ENCOUNTER — Other Ambulatory Visit: Payer: Self-pay

## 2020-04-13 ENCOUNTER — Ambulatory Visit (INDEPENDENT_AMBULATORY_CARE_PROVIDER_SITE_OTHER): Payer: 59 | Admitting: Psychiatry

## 2020-04-13 DIAGNOSIS — F411 Generalized anxiety disorder: Secondary | ICD-10-CM

## 2020-04-13 NOTE — Progress Notes (Signed)
      [facility] Counselor/Therapist Progress Note  Patient ID: Mariah Young, MRN: [id number],    Date: 04/13/2020  Time Spent: 60 minutes   5:00pm to 6:00pm  Treatment Type: Individual Therapy  Reported Symptoms: anxiety, some depression, some obsessiveness  Mental Status Exam:  Appearance:   Neat     Behavior:  Appropriate and Sharing  Motor:  Normal  Speech/Language:   Clear and Coherent  Affect:  anxiety, some depression  Mood:  anxious and depressed  Thought process:  goal directed  Thought content:    WNL  Sensory/Perceptual disturbances:    WNL  Orientation:  oriented to person, place, time/date, situation, day of week, month of year and year  Attention:  Good  Concentration:  Good and Fair  Memory:  WNL  Fund of knowledge:   Good  Insight:    Fair  Judgment:   Fair  Impulse Control:  Good and Fair   Risk Assessment: Danger to Self:  No Self-injurious Behavior: No Danger to Others: No Duty to Warn:no Physical Aggression / Violence:No  Access to Firearms a concern: No  Gang Involvement:No   Subjective: Patient today reports anxiety, depression, and some obsessiveness.  Wanting to understand myself better and feel better about myself.  Interventions: Cognitive Behavioral Therapy and Solution-Oriented/Positive Psychology  Diagnosis:   ICD-10-CM   1. Generalized anxiety disorder  F41.1      Plan of Care: Patient not signing tx plan on computer screen due to Covid.  Treatment Goals: Goals will remain on tx plan as patient works with strategies to achieve her goals. Progress will be noted each session and documented in "Progress" section of goal plan.   Long term goal:(Measurable) 1.Stabilize anxiety level while increasing ability to function on a daily basis. 2. Patient will eventually progress to where she rates her anxiety as a "3" or less on "1-10 Anxiety scale" for at least 2 months.  Short term goal: Increase understanding of beliefs  and messages that produce anxiety, worry,fear, and negativity.  Strategies: Identify, challenge, and replace anxious/fearful/negative with positive, hopeful, and empoweriing self-talk.  Progress: Patient in today reporting anxiety and depression, with some obsessiveness also. Started off talking more about some of her issues she discussed last session that are very sensitive and continue to be. Friend involved in situation discussed, did apologize for their behavior and they are working to move forward with good boundaries and respect.  Not as overwhelmed today, but did discuss some troubling personal/relationship issues that are very sensitive for her. (Not all info is included in this note due to patient's privacy needs.) Patient very sensitive, feeling some guilt, and trying to better understand feelings she is having.  Denies any SI.  Patient to work with her anxious thoughts between sessions and possibly do some journaling for [place] to continue discussing this next session.  Was calmer and more grounded at session end.  Goal review and progress/challenges noted with patient.  Next appt within 2 weeks.   Mathis Fare, LCSW

## 2020-04-27 ENCOUNTER — Other Ambulatory Visit: Payer: Self-pay

## 2020-04-27 ENCOUNTER — Ambulatory Visit (INDEPENDENT_AMBULATORY_CARE_PROVIDER_SITE_OTHER): Payer: 59 | Admitting: Psychiatry

## 2020-04-27 DIAGNOSIS — F411 Generalized anxiety disorder: Secondary | ICD-10-CM

## 2020-04-27 NOTE — Progress Notes (Signed)
[facility] Counselor/Therapist Progress Note  Patient ID: Mariah Young, MRN: [id number],    Date: 04/27/2020  Time Spent: 60 minutes  5:00pm to 6:00pm  Treatment Type: Individual Therapy  Reported Symptoms: anxiety, stressed, depression, "my ocd has been some worse recently"  Mental Status Exam:  Appearance:   Casual     Behavior:  Appropriate and Sharing  Motor:  Normal  Speech/Language:   Clear and Coherent  Affect:  Depressed  Mood:  depressed and sad  Thought process:  goal directed  Thought content:    WNL  Sensory/Perceptual disturbances:    WNL  Orientation:  oriented to person, place, time/date, situation, day of week, month of year and year  Attention:  Good  Concentration:  Good and Fair  Memory:  WNL  Fund of knowledge:   Good  Insight:    Good and Fair  Judgment:   Good and Fair  Impulse Control:  Fair and Poor   Risk Assessment: Danger to Self:  No Self-injurious Behavior: No Danger to Others: No Duty to Warn:no Physical Aggression / Violence:No  Access to Firearms a concern: No  Gang Involvement:No   Subjective: Patient today reporting anxiety and depression with depression being the stronger.  OCD has been some worse. Feeling stressed with work and personal.  Interventions: Cognitive Behavioral Therapy, Solution-Oriented/Positive Psychology and Ego-Supportive  Diagnosis:   ICD-10-CM   1. Generalized anxiety disorder  F41.1     Plan of Care: Patient not signing tx plan on computer screen due to Covid.  Treatment Goals: Goals will remain on tx plan as patient works with strategies to achieve her goals. Progress will be noted each session and documented in "Progress" section of goal plan.   Long term goal:(Measurable) 1.Stabilize anxiety level while increasing ability to function on a daily basis. 2. Patient will eventually progress to where she rates her anxiety as a "3" or less on "1-10 Anxiety scale" for at least 2  months.  Short term goal: Increase understanding of beliefs and messages that produce anxiety, worry,fear, and negativity.  Strategies: Identify, challenge, and replace anxious/fearful/negative with positive, hopeful, and empoweriing self-talk.  Progress: Patient in today reporting depression and anxiety stronger due to personal and work concerns.OCD is some worse also. Lonely and sad and nothing seems to make me happy. Feeling lonely at work as "friend" is on vacation and no contact from that person. Has had difficulty making friends except one. States she doesn't feel good about herself in fact has "hated myself since college years due to some friendships issues". Led to "my feeling unworthy of love."  Recently had made some "not real good" choices, and this seems to be a big part of her distress. (Not all details included in this note due to patient privacy needs.) Processed her painful feelings of "not being loved, not feeling deserving of love" and how these have tended to block her from allowing opportunities for a healthy relationship. Worked with her on these issues using her strategy and short term goal from her tx plan above, and patient is to continue working on this between sessions, especially in more quickly identifying her self negating and working to change those thoughts and patterns to be more self affirming, positive, and empowering.More calm and grounded by session end.  Goal review and progress/challenges noted with patient.  Next appointment within 2 weeks.   Mathis Fare, LCSW

## 2020-05-08 ENCOUNTER — Encounter: Payer: Self-pay | Admitting: Adult Health

## 2020-05-08 ENCOUNTER — Other Ambulatory Visit: Payer: Self-pay

## 2020-05-08 ENCOUNTER — Ambulatory Visit (INDEPENDENT_AMBULATORY_CARE_PROVIDER_SITE_OTHER): Payer: 59 | Admitting: Adult Health

## 2020-05-08 DIAGNOSIS — F411 Generalized anxiety disorder: Secondary | ICD-10-CM | POA: Diagnosis not present

## 2020-05-08 DIAGNOSIS — F410 Panic disorder [episodic paroxysmal anxiety] without agoraphobia: Secondary | ICD-10-CM | POA: Diagnosis not present

## 2020-05-08 DIAGNOSIS — F902 Attention-deficit hyperactivity disorder, combined type: Secondary | ICD-10-CM | POA: Diagnosis not present

## 2020-05-08 DIAGNOSIS — F422 Mixed obsessional thoughts and acts: Secondary | ICD-10-CM | POA: Diagnosis not present

## 2020-05-08 MED ORDER — DESVENLAFAXINE SUCCINATE ER 25 MG PO TB24: 25.0000 mg | ORAL_TABLET | Freq: Every day | ORAL | 5 refills | Status: DC
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
## 2020-05-10 ENCOUNTER — Encounter: Payer: Self-pay | Admitting: Psychiatry

## 2020-05-11 ENCOUNTER — Other Ambulatory Visit: Payer: Self-pay

## 2020-05-11 ENCOUNTER — Ambulatory Visit (INDEPENDENT_AMBULATORY_CARE_PROVIDER_SITE_OTHER): Payer: 59 | Admitting: Psychiatry

## 2020-05-11 DIAGNOSIS — F411 Generalized anxiety disorder: Secondary | ICD-10-CM | POA: Diagnosis not present

## 2020-05-11 NOTE — Progress Notes (Signed)
      [facility] Counselor/Therapist Progress Note  Patient ID: Mariah Young, MRN: [id number],    Date: 05/11/2020   Time Spent: 60 minutes   5:00pm to 6:00pm   Treatment Type: Individual Therapy  Reported Symptoms: anxiety, mild depression  Mental Status Exam:  Appearance:   Neat     Behavior:  Appropriate, Sharing and Motivated  Motor:  Normal  Speech/Language:   Clear and Coherent  Affect:  anxious  Mood:  anxious  Thought process:  normal  Thought content:    some obsessiveness  Sensory/Perceptual disturbances:    WNL  Orientation:  oriented to person, place, time/date, situation, day of week, month of year and year  Attention:  Good  Concentration:  Good  Memory:  WNL  Fund of knowledge:   Good  Insight:    Fair  Judgment:   Fair  Impulse Control:  Fair   Risk Assessment: Danger to Self:  No Self-injurious Behavior: No Danger to Others: No Duty to Warn:no Physical Aggression / Violence:No  Access to Firearms a concern: No  Gang Involvement:No   Subjective: Patient today reports anxiety and mild depression.  Feeling like I'm trying to make better decisions but not always successful. Some optimism.  Interventions: Cognitive Behavioral Therapy and Solution-Oriented/Positive Psychology  Diagnosis:   ICD-10-CM   1. Generalized anxiety disorder  F41.1      Plan of Care: Patient not signing tx plan on computer screen due to Covid.  Treatment Goals: Goals will remain on tx plan as patient works with strategies to achieve her goals. Progress will be noted each session and documented in "Progress" section of goal plan.   Long term goal:(Measurable) 1.Stabilize anxiety level while increasing ability to function on a daily basis. 2. Patient will eventually progress to where she rates her anxiety as a "3" or less on "1-10 Anxiety scale" for at least 2 months.  Short term goal: Increase understanding of beliefs and messages that produce anxiety,  worry,fear, and negativity.  Strategies: Identify, challenge, and replace anxious/fearful/negative with positive, hopeful, and empoweriing self-talk.  Progress: Patient in today reporting anxiety and mild depression. Trying to make better decisions. Situation involved with online dating discussed and patient vented a lot re: decision made that she realized was not a good decision, and has been concerning to patient.  (Not all details included in this note.) Patient visibly upset by some decisions and feeling badly now.  She did report feeling better after talking through the situation and seemed to be more self-accepting and has learned from situation. Loneliness has decreased some and is trying to meet more people, but make good decisions. States she's not been feeling anymore of the "not worthy of love" feelings.  Did seem more leveled out emotionally after talking through things and realized that she can talk about what ever she needs to talk about without being judged here.  Encouraged good self-care including being in contact with people who are healthy for her, doing some enjoyable things for herself, looking for more positives and negatives, spending time with her dog, identifying and interrupting her self negating while working to replace those negative patterns with more encouraging, self affirming, and empowering patterns of treating herself.  Goal review and progress/challenges noted with patient.  Next appointment within 2 weeks.   Mathis Fare, LCSW

## 2020-05-25 ENCOUNTER — Ambulatory Visit (INDEPENDENT_AMBULATORY_CARE_PROVIDER_SITE_OTHER): Payer: 59 | Admitting: Psychiatry

## 2020-05-25 ENCOUNTER — Other Ambulatory Visit: Payer: Self-pay

## 2020-05-25 DIAGNOSIS — F411 Generalized anxiety disorder: Secondary | ICD-10-CM | POA: Diagnosis not present

## 2020-05-25 NOTE — Progress Notes (Signed)
      [facility] Counselor/Therapist Progress Note  Patient ID: Mariah Young, MRN: [id number],    Date: 05/25/2020  Time Spent: 60 minutes   5:00pm to 6:00pm  Treatment Type: Individual Therapy  Reported Symptoms: anxiety, some worry and depression "but mostly anxiety"  Mental Status Exam:  Appearance:   Neat     Behavior:  Appropriate, Sharing and Motivated  Motor:  Normal  Speech/Language:   Clear and Coherent  Affect:  anxious, worry, depression  Mood:  anxious and depressed  Thought process:  goal directed  Thought content:    Obsessions  Sensory/Perceptual disturbances:    WNL  Orientation:  oriented to person, place, time/date, situation, day of week, month of year and year  Attention:  Good  Concentration:  Good  Memory:  WNL  Fund of knowledge:   Good  Insight:    Good and Fair  Judgment:   Good and Fair  Impulse Control:  Good and Fair   Risk Assessment: Danger to Self:  No Self-injurious Behavior: No Danger to Others: No Duty to Warn:no Physical Aggression / Violence:No  Access to Firearms a concern: No  Gang Involvement:No   Subjective: Patient today reports anxiety, worry, and some depression, "but mostly anxiety".  Interventions: Cognitive Behavioral Therapy and Solution-Oriented/Positive Psychology  Diagnosis:   ICD-10-CM   1. Generalized anxiety disorder  F41.1      Plan of Care: Patient not signing tx plan on computer screen due to Covid.  Treatment Goals: Goals will remain on tx plan as patient works with strategies to achieve her goals. Progress will be noted each session and documented in "Progress" section of goal plan.   Long term goal:(Measurable) 1.Stabilize anxiety level while increasing ability to function on a daily basis. 2. Patient will eventually progress to where she rates her anxiety as a "3" or less on "1-10 Anxiety scale" for at least 2 months.  Short term goal: Increase understanding of beliefs and messages  that produce anxiety, worry,fear, and negativity.  Strategies: Identify, challenge, and replace anxious/fearful/negative with positive, hopeful, and empoweriing self-talk.  Progress: Patient in today reporting anxiety as main symptom, but also worry and some depression. Still involved in some online contacts in meeting other people. Shared a lot about her experiences meeting other people, and particularly about one relationship she has become involved in.  Anxious and "unsure of herself and sometimes others". Discussed some techniques for good decision-making based on info she shared.  Patient also discussed how to trust is a big issue for her and is working on not making quick assumptions in the current relationship which is still rather new, and also in her work environment and with other people in situations. Encouraged positive self-care including making good decisions, looking for more positives versus negatives, not "looking for the worst" to happen, contact with supportive people, healthy nutrition and exercise, practice doing some enjoyable things for herself, enjoying her pet dog, decreasing her self-negating, and working to make her thoughts and self-talk more encouraging, reality-based, and self-affirming.  Goal review and progress/challenges noted with patient.  Next appointment within 2 to 3 weeks.   Mathis Fare, LCSW

## 2020-06-06 ENCOUNTER — Other Ambulatory Visit: Payer: Self-pay

## 2020-06-06 ENCOUNTER — Encounter: Payer: Self-pay | Admitting: Adult Health

## 2020-06-06 ENCOUNTER — Ambulatory Visit (INDEPENDENT_AMBULATORY_CARE_PROVIDER_SITE_OTHER): Payer: 59 | Admitting: Adult Health

## 2020-06-06 DIAGNOSIS — F422 Mixed obsessional thoughts and acts: Secondary | ICD-10-CM

## 2020-06-06 DIAGNOSIS — F411 Generalized anxiety disorder: Secondary | ICD-10-CM

## 2020-06-06 DIAGNOSIS — F902 Attention-deficit hyperactivity disorder, combined type: Secondary | ICD-10-CM | POA: Diagnosis not present

## 2020-06-06 DIAGNOSIS — F410 Panic disorder [episodic paroxysmal anxiety] without agoraphobia: Secondary | ICD-10-CM | POA: Diagnosis not present

## 2020-06-06 MED ORDER — ALPRAZOLAM 1 MG PO TABS: 1.0000 mg | ORAL_TABLET | Freq: Two times a day (BID) | ORAL | 2 refills | Status: DC | PRN

## 2020-06-06 MED ORDER — AMPHETAMINE-DEXTROAMPHET ER 25 MG PO CP24: 50.0000 mg | ORAL_CAPSULE | Freq: Every day | ORAL | 0 refills | Status: DC

## 2020-06-06 MED ORDER — AMPHETAMINE-DEXTROAMPHETAMINE 20 MG PO TABS: 20.0000 mg | ORAL_TABLET | Freq: Every day | ORAL | 0 refills | Status: DC

## 2020-06-06 MED ORDER — DESVENLAFAXINE SUCCINATE ER 50 MG PO TB24: 50.0000 mg | ORAL_TABLET | Freq: Every day | ORAL | 5 refills | Status: DC
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
## 2020-06-07 ENCOUNTER — Ambulatory Visit (INDEPENDENT_AMBULATORY_CARE_PROVIDER_SITE_OTHER): Payer: 59 | Admitting: Psychiatry

## 2020-06-07 DIAGNOSIS — F411 Generalized anxiety disorder: Secondary | ICD-10-CM | POA: Diagnosis not present

## 2020-06-07 NOTE — Progress Notes (Signed)
[facility] Counselor/Therapist Progress Note  Patient ID: Mariah Young, MRN: [id number],    Date: 06/07/2020  Time Spent: 60 minutes   5:00pm to 6:00pm  Treatment Type: Individual Therapy  Reported Symptoms: anxiety, depression  Mental Status Exam:  Appearance:   Neat     Behavior:  Appropriate, Sharing and Motivated  Motor:  Normal  Speech/Language:   Clear and Coherent  Affect:  anxious, depressed  Mood:  anxious and depressed  Thought process:  goal directed  Thought content:    some obsessiveness  Sensory/Perceptual disturbances:    WNL  Orientation:  oriented to person, place, time/date, situation, day of week, month of year and year  Attention:  Fair  Concentration:  Fair  Memory:  WNL  Fund of knowledge:   Good  Insight:    Fair  Judgment:   Fair  Impulse Control:  Fair   Risk Assessment: Danger to Self:  No Self-injurious Behavior: No Danger to Others: No Duty to Warn:no Physical Aggression / Violence:No  Access to Firearms a concern: No  Gang Involvement:No   Subjective:  Patient today reports anxiety and depression.  Anxious"in general" about life, overwhelmed with work and some with personal issues.  Interventions: Cognitive Behavioral Therapy and Solution-Oriented/Positive Psychology  Diagnosis:   ICD-10-CM   1. Generalized anxiety disorder  F41.1      Plan of Care: Patient not signing tx plan on computer screen due to Covid.  Treatment Goals: Goals will remain on tx plan as patient works with strategies to achieve her goals. Progress will be noted each session and documented in "Progress" section of goal plan.   Long term goal:(Measurable) 1.Stabilize anxiety level while increasing ability to function on a daily basis. 2. Patient will eventually progress to where she rates her anxiety as a "3" or less on "1-10 Anxiety scale" for at least 2 months.  Short term goal: Increase understanding of beliefs and messages that  produce anxiety, worry,fear, and negativity.  Strategies: Identify, challenge, and replace anxious/fearful/negative with positive, hopeful, and empoweriing self-talk.  Progress: Patient in today reporting anxiety and depression. Tearfully and anxiously explains that she and person she was developing a relationship with have broken-up about a week ago. Processed hurtful and mixed feelings from that situation. Family visited same weekend and "that didn't go well either." Poor boundaries with some family members. Incident happened where patient felt minimized by family and the conversation didn't go well. Lots of hurtful/anxious/negative thoughts have continued to bother patient.  Self-negating as well. Worked in session with the short term goal and strategy on her tx plan above, focusing on her anxious/hurtful/negative thoughts and challenging them/replacing them with more positive, reality-based and empowering/encouraging thoughts that do not lead to  Anxiety/hurt/negativity. Feeling that "everybody's life is perfect and mine is not." Continues to feel unsure of herself and whether good things will happen for her.  Processed these feelings some and was more calm and grounded at session end. Encouraged patient to work further on not making quick assumptions in situations, and in building more trust.  Encouraged patient to practice positive self-care between sessions including positive self talk, not looking "for the worst to happen", remaining in contact with supportive people, healthy nutrition and exercise, participating in things that she enjoys for herself, decreasing the self negating, enjoying her pet dog which is her emotional support animal, and looking for positives versus negatives each day.  Goal review and progress/challenges noted with patient.  Next appointment within 2  weeks.   Shanon Ace, LCSW

## 2020-06-21 ENCOUNTER — Other Ambulatory Visit: Payer: Self-pay

## 2020-06-21 ENCOUNTER — Ambulatory Visit (INDEPENDENT_AMBULATORY_CARE_PROVIDER_SITE_OTHER): Payer: 59 | Admitting: Psychiatry

## 2020-06-21 DIAGNOSIS — F411 Generalized anxiety disorder: Secondary | ICD-10-CM | POA: Diagnosis not present

## 2020-06-21 NOTE — Progress Notes (Signed)
[facility] Counselor/Therapist Progress Note  Patient ID: Mariah Young, MRN: [id number],    Date: 06/21/2020  Time Spent: 60 minutes   4:00pm to 5:00pm  Treatment Type: Individual Therapy  Reported Symptoms: anxiety, depression has decreased some  Mental Status Exam:  Appearance:   Neat     Behavior:  Appropriate and Sharing  Motor:  Normal  Speech/Language:   Clear and Coherent  Affect:  anxious, some depression  Mood:  anxious and some depression  Thought process:  goal directed  Thought content:    WNL  Sensory/Perceptual disturbances:    WNL  Orientation:  oriented to person, place, time/date, situation, day of week, month of year and year  Attention:  Good  Concentration:  Good  Memory:  WNL  Fund of knowledge:   Good  Insight:    Good  Judgment:   Good and Fair  Impulse Control:  Fair   Risk Assessment: Danger to Self:  No Self-injurious Behavior: No Danger to Others: No Duty to Warn:no Physical Aggression / Violence:No  Access to Firearms a concern: No  Gang Involvement:No   Subjective:  Patient reports today anxiety, and some decrease in her depression. Feeling a little happier at times. Some optimism.  Interventions: Cognitive Behavioral Therapy and Solution-Oriented/Positive Psychology  Diagnosis:   ICD-10-CM   1. Generalized anxiety disorder  F41.1      Plan of Care: Patient not signing tx plan on computer screen due to Covid.  Treatment Goals: Goals will remain on tx plan as patient works with strategies to achieve her goals. Progress will be noted each session and documented in "Progress" section of goal plan.   Long term goal:(Measurable) 1.Stabilize anxiety level while increasing ability to function on a daily basis. 2. Patient will eventually progress to where she rates her anxiety as a "3" or less on "1-10 Anxiety scale" for at least 2 months.  Short term goal: Increase understanding of beliefs and messages that  produce anxiety, worry,fear, and negativity.  Strategies: Identify, challenge, and replace anxious/fearful/negative with positive, hopeful, and empoweriing self-talk.  Progress: Patient in today reporting anxiety, but also some happier at times and some optimism.  Did get "back with Fayrene Fearing", a person she recently had dated some. After talking things through with him, she feels more encouraged. Self-doubt and wanting to feel more confident. Not feeling as "on edge". Less tearful.  Anxious thoughts continue and we used part of session today working with her long term goal and strategy in Tx Plan above, to focus on interrupting anxious thoughts more quickly and replacing them with positive, more reality-based thoughts. Asked patient about her earlier hurt and concerns re: her family and feeling minimized as discussed last session. Patient reports she continued processing the discussion from our last session and did get t feeling less hurt and more "ok with myself." Encouraged continued better self-care and more positive self-talk, staying in the present, focusing on what she can control versus what she cannot, practicing believing in herself more and seeing her positives, not making quick assumptions about self or others, continue her work on trying to build trust more with others, not looking for the worst to happen, staying in contact with supportive people, remaining involved in activities that she enjoys, enjoying her pet dog which is an emotional support animal for her, look for positives each day, and reduce her overthinking.  Goal review and progress/challenges noted with patient.  Next appointment within 2 weeks.   Mathis Fare,  LCSW

## 2020-07-05 ENCOUNTER — Other Ambulatory Visit: Payer: Self-pay

## 2020-07-05 ENCOUNTER — Ambulatory Visit (INDEPENDENT_AMBULATORY_CARE_PROVIDER_SITE_OTHER): Payer: 59 | Admitting: Psychiatry

## 2020-07-05 DIAGNOSIS — F411 Generalized anxiety disorder: Secondary | ICD-10-CM

## 2020-07-05 NOTE — Progress Notes (Signed)
[facility] Counselor/Therapist Progress Note  Patient ID: Madelline Eshbach, MRN: [id number],    Date: 07/05/2020  Time Spent: 60 minutes   5:00pm to 6:00pm  Treatment Type: Individual Therapy  Reported Symptoms: Anxiety, ocd, some depression "but some better"  Mental Status Exam:  Appearance:   Casual     Behavior:  Appropriate, Sharing and Motivated  Motor:  Normal  Speech/Language:   Clear and Coherent  Affect:  anxious  Mood:  anxious and depressed  Thought process:  goal directed  Thought content:    WNL  Sensory/Perceptual disturbances:    WNL  Orientation:  oriented to person, place, time/date, situation, day of week, month of year and year  Attention:  Good  Concentration:  Good and Fair  Memory:  WNL  Fund of knowledge:   Good  Insight:    Good and Fair  Judgment:   Good and Fair  Impulse Control:  Fair   Risk Assessment: Danger to Self:  No Self-injurious Behavior: No Danger to Others: No Duty to Warn:no Physical Aggression / Violence:No  Access to Firearms a concern: No  Gang Involvement:No   Subjective: Patient today reports anxiety and some depression "but better". Is in relationship since end of October of this year.    Interventions: Cognitive Behavioral Therapy, Solution-Oriented/Positive Psychology and Ego-Supportive  Diagnosis:   ICD-10-CM   1. Generalized anxiety disorder  F41.1     Plan of Care: Patient not signing tx plan on computer screen due to Covid.  Treatment Goals: Goals will remain on tx plan as patient works with strategies to achieve her goals. Progress will be noted each session and documented in "Progress" section of goal plan.   Long term goal:(Measurable) 1.Stabilize anxiety level while increasing ability to function on a daily basis. 2. Patient will eventually progress to where she rates her anxiety as a "3" or less on "1-10 Anxiety scale" for at least 2 months.  Short term goal: Increase understanding of  beliefs and messages that produce anxiety, worry,fear, and negativity.  Strategies: Identify, challenge, and replace anxious/fearful/negative with positive, hopeful, and empoweriing self-talk.  Progress: Patient in today reporting anxiety and some depression.  Depression has decreased some. Processed thoughts and feelings she is having about her current relationship Fayrene Fearing). Trying to not let things move too fast. Also processed some difficulties on her job especially with 1 particular co-worker. Very concerned and upset, and felt co-worker was very inappropriate and hurtful. Still having some strong feelings about situation and interactions with coworker.Discussed ways of handling necessary interactions with that coworker, and is to speak with her supervisor soon. Patient feeling more grounded after talking in session and feeling supported and now has some good alternative ways of managing her work situation and feeling more confident in herself. Tearful intermittently but by session and seemed to feel more strength and is actually noticing some improvement in her ability to stand up for herself as needed and appropriate. Encouraged patient as she continues to improve her self-care with more encouraging self talk, working to stop her self negating, staying in contact with supportive people, remaining involved in activities that she enjoys including her pet dog, avoid assuming the worst in situations, staying in the present, intentionally looking each day for more positives, focus on what she can control versus what she cannot control, and continue some of the work she is doing in trying to build more trusting relationships with others.  Goal review and progress/challenges noted with patient.  Next appointment within 2 weeks.   Shanon Ace, LCSW

## 2020-07-19 ENCOUNTER — Other Ambulatory Visit: Payer: Self-pay

## 2020-07-19 ENCOUNTER — Ambulatory Visit (INDEPENDENT_AMBULATORY_CARE_PROVIDER_SITE_OTHER): Payer: 59 | Admitting: Psychiatry

## 2020-07-19 DIAGNOSIS — F411 Generalized anxiety disorder: Secondary | ICD-10-CM

## 2020-07-19 NOTE — Progress Notes (Signed)
[facility] Counselor/Therapist Progress Note  Patient ID: Mariah Young, MRN: [id number],    Date: 07/19/2020  Time Spent: 60 minutes   4:00pm to 5:00pm  Treatment Type: Individual Therapy  Reported Symptoms: anxiety, depression ("mild")  Mental Status Exam:  Appearance:   Casual     Behavior:  Appropriate, Sharing and Motivated  Motor:  Normal  Speech/Language:   Clear and Coherent  Affect:  anxious  Mood:  anxious  Thought process:  goal directed  Thought content:    WNL  Sensory/Perceptual disturbances:    WNL  Orientation:  oriented to person, place, time/date, situation, day of week, month of year and year  Attention:  Good  Concentration:  Good and Fair  Memory:  WNL  Fund of knowledge:   Good  Insight:    Good and Fair  Judgment:   Good and Fair  Impulse Control:  Good and Fair   Risk Assessment: Danger to Self:  No Self-injurious Behavior: No Danger to Others: No Duty to Warn:no Physical Aggression / Violence:No  Access to Firearms a concern: No  Gang Involvement:No   Subjective: Patient in today reporting anxiety and mild depression. She reports she managed family stressors at the holidays some better, "especially family expectations."  Still focusing on her goals and "not what others may want me to do."  Interventions: Solution-Oriented/Positive Psychology and Ego-Supportive  Diagnosis:   ICD-10-CM   1. Generalized anxiety disorder  F41.1      Plan of Care: Patient not signing tx plan on computer screen due to Covid.  Treatment Goals: Goals will remain on tx plan as patient works with strategies to achieve her goals. Progress will be noted each session and documented in "Progress" section of goal plan.   Long term goal:(Measurable) 1.Stabilize anxiety level while increasing ability to function on a daily basis. 2. Patient will eventually progress to where she rates her anxiety as a "3" or less on "1-10 Anxiety scale" for at least 2  months.  Short term goal: Increase understanding of beliefs and messages that produce anxiety, worry,fear, and negativity.  Strategies: Identify, challenge, and replace anxious/fearful/negative with positive, hopeful, and empoweriing self-talk.  Progress: Patient in today reporting anxiety and mild depression. Reports newer relationship "is going well so far" and BF is in [place]. Patient feel stuck "where I am now", questioning if she should have taken current job or gone straight to med school. Processing her anxious/questioning thoughts, second-guessing herself, and some issues with her older sister which has led to bigger issues between patient and parents. Dicussed her feelings of frustration, lack of privacy, and feeling manipulated in detail during session today. Tearful and realizing more that she really wants/needs to set healthier boundaries within family.  Able to outline what type of boundaries she feels would be appropriate.  Wants to think more about it but feeling some stronger within herself to have boundaries, "something that is very new to me".  History of "family intrusiveness has been stressful and limiting in ways." Patient seemed to feel more grounded by session end, and is to process more of her thoughts on these issues, and what she wants for herself in terms of privacy and boundaries that feel healthier to her, and may include some journaling before next session. Sensing a stronger sense of herself and believing in herself more.  Reviewed positive self-care emotionally and physically for her to practice between sessions.   Goal review and progress/challenges noted with patient.  Next appt  within 2-3 weeks.   Shanon Ace, LCSW

## 2020-07-31 ENCOUNTER — Encounter: Payer: Self-pay | Admitting: Adult Health

## 2020-07-31 ENCOUNTER — Other Ambulatory Visit: Payer: Self-pay

## 2020-07-31 ENCOUNTER — Ambulatory Visit (INDEPENDENT_AMBULATORY_CARE_PROVIDER_SITE_OTHER): Payer: 59 | Admitting: Adult Health

## 2020-07-31 DIAGNOSIS — F410 Panic disorder [episodic paroxysmal anxiety] without agoraphobia: Secondary | ICD-10-CM

## 2020-07-31 DIAGNOSIS — F422 Mixed obsessional thoughts and acts: Secondary | ICD-10-CM | POA: Diagnosis not present

## 2020-07-31 DIAGNOSIS — F411 Generalized anxiety disorder: Secondary | ICD-10-CM

## 2020-07-31 DIAGNOSIS — F902 Attention-deficit hyperactivity disorder, combined type: Secondary | ICD-10-CM

## 2020-07-31 MED ORDER — AMPHETAMINE-DEXTROAMPHET ER 25 MG PO CP24: 50.0000 mg | ORAL_CAPSULE | Freq: Every day | ORAL | 0 refills | Status: DC

## 2020-07-31 MED ORDER — AMPHETAMINE-DEXTROAMPHETAMINE 20 MG PO TABS: 20.0000 mg | ORAL_TABLET | Freq: Every day | ORAL | 0 refills | Status: DC

## 2020-07-31 MED ORDER — ALPRAZOLAM 1 MG PO TABS: 1.0000 mg | ORAL_TABLET | Freq: Two times a day (BID) | ORAL | 2 refills | Status: DC | PRN
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
## 2020-08-02 ENCOUNTER — Other Ambulatory Visit: Payer: Self-pay

## 2020-08-02 ENCOUNTER — Ambulatory Visit (INDEPENDENT_AMBULATORY_CARE_PROVIDER_SITE_OTHER): Payer: 59 | Admitting: Psychiatry

## 2020-08-02 DIAGNOSIS — F411 Generalized anxiety disorder: Secondary | ICD-10-CM

## 2020-08-02 NOTE — Progress Notes (Signed)
[facility] Counselor/Therapist Progress Note  Patient ID: Mariah Young, MRN: [id number],    Date: 08/02/2020  Time Spent: 60 minutes    3:55pm to 4:55pm  Treatment Type: Individual Therapy  Reported Symptoms: Anxiety, depression, with "anxiety being the stronger symptom."  Mental Status Exam:  Appearance:   Casual     Behavior:  Appropriate, Sharing and Motivated  Motor:  Normal  Speech/Language:   Clear and Coherent  Affect:  anxious  Mood:  anxious and depressed  Thought process:  goal directed  Thought content:    some obsessiveness  Sensory/Perceptual disturbances:    WNL  Orientation:  oriented to person, place, time/date, situation, day of week, month of year and year  Attention:  Good  Concentration:  Fair  Memory:  WNL  Fund of knowledge:   Good  Insight:    Good  Judgment:   Good  Impulse Control:  Fair   Risk Assessment: Danger to Self:  No Self-injurious Behavior: No Danger to Others: No Duty to Warn:no Physical Aggression / Violence:No  Access to Firearms a concern: No  Gang Involvement:No   Subjective: Patient today reports anxiety and depression, with anxiety being the strongest symptom. Working on establishing better boundaries with her family and relationships with friends.   Interventions: Cognitive Behavioral Therapy and Solution-Oriented/Positive Psychology  Diagnosis:   ICD-10-CM   1. Generalized anxiety disorder  F41.1      Plan of Care: Patient not signing tx plan on computer screen due to Covid.  Treatment Goals: Goals will remain on tx plan as patient works with strategies to achieve her goals. Progress will be noted each session and documented in "Progress" section of goal plan.   Long term goal:(Measurable) 1.Stabilize anxiety level while increasing ability to function on a daily basis. 2. Patient will eventually progress to where she rates her anxiety as a "3" or less on "1-10 Anxiety scale" for at least 2  months.  Short term goal: Increase understanding of beliefs and messages that produce anxiety, worry,fear, and negativity.  Strategies: Identify, challenge, and replace anxious/fearful/negative with positive, hopeful, and empoweriing self-talk.  Progress: Patient in today reporting anxiety and depression.  Anxiety is "my main symptom but still some depression."  Needing better boundaries with my family and friends. Trying to re-evaluate how I spend some of my time. Things are still positive/supportive between her and current BF. Still unsure about her future and possible return to school. Having some recent physical issues and saw her gynecologist recently and a procedure within a couple weeks "that doesn't seem to be extremely serious at least with what I know right now, but am concerned". To also have breast exam by breast specialist. Processed her anxieties about these physical concerns, and about boundary issues with her family, second-guessing herself, and seemed to feel calmer and more grounded by session end.  Boundaries with family is a big concern for her and she is showing more efforts at this point, even though that behavior is very new for patient.  Wants to feel stronger within herself and not have guilt when she does set boundaries.  Encouraged patient to practice positive/affirming self talk and be more attuned to positive self-care overall emotionally and physically and to practice strategies discussed in sessions previously and today.  Goal review and progress/challenges noted with patient.  Next appointment within 2-3 weeks.   Mathis Fare, LCSW

## 2020-08-03 ENCOUNTER — Other Ambulatory Visit: Payer: Self-pay | Admitting: Obstetrics and Gynecology

## 2020-08-03 DIAGNOSIS — N6310 Unspecified lump in the right breast, unspecified quadrant: Secondary | ICD-10-CM

## 2020-08-10 ENCOUNTER — Other Ambulatory Visit: Payer: Self-pay

## 2020-08-10 ENCOUNTER — Ambulatory Visit
Admission: EM | Admit: 2020-08-10 | Discharge: 2020-08-10 | Disposition: A | Discharge status: Home / Self Care | Payer: 59 | Attending: Emergency Medicine | Admitting: Emergency Medicine

## 2020-08-10 DIAGNOSIS — Z20822 Contact with and (suspected) exposure to covid-19: Secondary | ICD-10-CM

## 2020-08-10 DIAGNOSIS — J029 Acute pharyngitis, unspecified: Secondary | ICD-10-CM

## 2020-08-10 HISTORY — DX: Obsessive-compulsive disorder, unspecified: F42.9

## 2020-08-10 LAB — POCT RAPID STREP A (OFFICE): Rapid Strep A Screen: NEGATIVE

## 2020-08-10 MED ORDER — CETIRIZINE HCL 10 MG PO TABS
10.0000 mg | ORAL_TABLET | Freq: Every day | ORAL | 0 refills | Status: DC
Start: 2020-08-10 — End: 2021-02-22

## 2020-08-10 MED ORDER — BENZONATATE 100 MG PO CAPS
100.0000 mg | ORAL_CAPSULE | Freq: Three times a day (TID) | ORAL | 0 refills | Status: DC
Start: 2020-08-10 — End: 2020-11-02

## 2020-08-10 MED ORDER — FLUTICASONE PROPIONATE 50 MCG/ACT NA SUSP
1.0000 | Freq: Every day | NASAL | 0 refills | Status: DC
Start: 2020-08-10 — End: 2020-11-14

## 2020-08-10 MED ORDER — SUCRALFATE 1 GM/10ML PO SUSP
1.0000 g | Freq: Three times a day (TID) | ORAL | 0 refills | Status: DC
Start: 2020-08-10 — End: 2020-11-14

## 2020-08-10 NOTE — ED Provider Notes (Signed)
[facility]    CSN: [id number] Arrival date & time: 08/10/20  1740      History   Chief Complaint Chief Complaint  Patient presents with  . Sore Throat    HPI Mariah Young is a 25 y.o. female  History as below presenting for weeklong course of sore throat, dry cough, congestion, frontal headache and fatigue.  States sore throat became worse today.  No known COVID contacts, strep contacts.  Denies fever, chest pain, shortness of breath.  Past Medical History:  Diagnosis Date  . ADHD (attention deficit hyperactivity disorder)   . Anxiety   . Diaphoresis   . Dysmenorrhea   . IUD (intrauterine device) in place   . Obesity (BMI 30.0-34.9)   . Obsessive compulsive disorder     Patient Active Problem List   Diagnosis Date Noted  . Generalized anxiety disorder 06/14/2019  . Obsessive compulsive disorder 06/14/2019  . Panic disorder 06/14/2019  . Attention deficit hyperactivity disorder (ADHD), combined type 12/07/2018  . Anxiety 12/07/2018  . Acute non-recurrent maxillary sinusitis 07/12/2017  . Cough 07/12/2017    Past Surgical History:  Procedure Laterality Date  . BREAST LUMPECTOMY Right 2014  . BREAST SURGERY    . REDUCTION MAMMAPLASTY Bilateral 2013    OB History   No obstetric history on file.      Home Medications    Prior to Admission medications   Medication Sig Start Date End Date Taking? Authorizing Provider  benzonatate (TESSALON) 100 MG capsule Take 1 capsule (100 mg total) by mouth every 8 (eight) hours. 08/10/20  Yes Hall-Potvin, [place], PA-C  cetirizine (ZYRTEC ALLERGY) 10 MG tablet Take 1 tablet (10 mg total) by mouth daily. 08/10/20  Yes Hall-Potvin, [place], PA-C  fluticasone (FLONASE) 50 MCG/ACT nasal spray Place 1 spray into both nostrils daily. 08/10/20  Yes Hall-Potvin, [place], PA-C  sucralfate (CARAFATE) 1 GM/10ML suspension Take 10 mLs (1 g total) by mouth 4 (four) times daily -  with meals and at bedtime. 08/10/20  Yes  Hall-Potvin, [place], PA-C  ALPRAZolam (XANAX) 1 MG tablet Take 1 tablet (1 mg total) by mouth 2 (two) times daily as needed for anxiety. 07/31/20   Mozingo, Thereasa Solo, NP  amphetamine-dextroamphetamine (ADDERALL XR) 25 MG 24 hr capsule Take 2 capsules by mouth daily after breakfast. 07/31/20 08/30/20  Mozingo, Thereasa Solo, NP  amphetamine-dextroamphetamine (ADDERALL XR) 25 MG 24 hr capsule Take 2 capsules by mouth daily after breakfast. 08/28/20   Mozingo, Thereasa Solo, NP  amphetamine-dextroamphetamine (ADDERALL XR) 25 MG 24 hr capsule Take 2 capsules by mouth daily after breakfast. 09/25/20   Mozingo, Thereasa Solo, NP  amphetamine-dextroamphetamine (ADDERALL) 20 MG tablet Take 1 tablet (20 mg total) by mouth daily before supper. 07/31/20 08/30/20  Mozingo, Thereasa Solo, NP  amphetamine-dextroamphetamine (ADDERALL) 20 MG tablet Take 1 tablet (20 mg total) by mouth daily before supper. 08/28/20   Mozingo, Thereasa Solo, NP  amphetamine-dextroamphetamine (ADDERALL) 20 MG tablet Take 1 tablet (20 mg total) by mouth daily before supper. 09/25/20   Mozingo, Thereasa Solo, NP  desvenlafaxine (PRISTIQ) 50 MG 24 hr tablet Take 1 tablet (50 mg total) by mouth daily. 06/06/20   Mozingo, Thereasa Solo, NP  Desvenlafaxine Succinate ER (PRISTIQ) 25 MG TB24 Take 25 mg by mouth daily. 05/08/20   Mozingo, Thereasa Solo, NP  Multiple Vitamin (MULTI-VITAMIN) tablet Take by mouth.    [provider]    Family History Family History  Problem Relation Age of Onset  . Hypertension Mother   .  Hypertension Father   . Colon cancer Maternal Grandfather   . Lung cancer Paternal Grandfather     Social History Social History   Tobacco Use  . Smoking status: Never Smoker  . Smokeless tobacco: Never Used  Vaping Use  . Vaping Use: Never used  Substance Use Topics  . Alcohol use: Yes  . Drug use: Never     Allergies   Clarithromycin and Penicillins   Review of Systems Review of  Systems  Constitutional: Positive for fatigue. Negative for fever.  HENT: Positive for congestion and sore throat. Negative for dental problem, ear pain, facial swelling, hearing loss, sinus pain, trouble swallowing and voice change.   Eyes: Negative for photophobia, pain and visual disturbance.  Respiratory: Positive for cough. Negative for shortness of breath.   Cardiovascular: Negative for chest pain and palpitations.  Gastrointestinal: Negative for diarrhea and vomiting.  Musculoskeletal: Negative for arthralgias and myalgias.  Neurological: Positive for headaches. Negative for dizziness.     Physical Exam Triage Vital Signs ED Triage Vitals  Enc Vitals Group     BP 08/10/20 1919 (!) 130/91     Pulse Rate 08/10/20 1919 (!) 101     Resp 08/10/20 1919 18     Temp 08/10/20 1919 98.8 F (37.1 C)     Temp src --      SpO2 08/10/20 1919 97 %     Weight --      Height --      Head Circumference --      Peak Flow --      Pain Score 08/10/20 1920 6     Pain Loc --      Pain Edu? --      Excl. in GC? --    No data found.  Updated Vital Signs BP (!) 130/91 (BP Location: Left Arm)   Pulse (!) 101   Temp 98.8 F (37.1 C)   Resp 18   SpO2 97%   Visual Acuity Right Eye Distance:   Left Eye Distance:   Bilateral Distance:    Right Eye Near:   Left Eye Near:    Bilateral Near:     Physical Exam Constitutional:      General: She is not in acute distress.    Appearance: She is not ill-appearing or diaphoretic.  HENT:     Head: Normocephalic and atraumatic.     Right Ear: Tympanic membrane and ear canal normal.     Left Ear: Tympanic membrane and ear canal normal.     Mouth/Throat:     Mouth: Mucous membranes are moist.     Pharynx: Oropharynx is clear. No oropharyngeal exudate or posterior oropharyngeal erythema.  Eyes:     General: No scleral icterus.    Conjunctiva/sclera: Conjunctivae normal.     Pupils: Pupils are equal, round, and reactive to light.  Neck:      Comments: Trachea midline, negative JVD Cardiovascular:     Rate and Rhythm: Normal rate and regular rhythm.     Heart sounds: No murmur heard. No gallop.   Pulmonary:     Effort: Pulmonary effort is normal. No respiratory distress.     Breath sounds: No wheezing, rhonchi or rales.  Musculoskeletal:     Cervical back: Neck supple. No tenderness.  Lymphadenopathy:     Cervical: No cervical adenopathy.  Skin:    Capillary Refill: Capillary refill takes less than 2 seconds.     Coloration: Skin is not jaundiced or pale.  Findings: No rash.  Neurological:     General: No focal deficit present.     Mental Status: She is alert and oriented to person, place, and time.      UC Treatments / Results  Labs (all labs ordered are listed, but only abnormal results are displayed) Labs Reviewed  NOVEL CORONAVIRUS, NAA  CULTURE, GROUP A STREP [facility])  POCT RAPID STREP A (OFFICE)    EKG   Radiology No results found.  Procedures Procedures (including critical care time)  Medications Ordered in UC Medications - No data to display  Initial Impression / Assessment and Plan / UC Course  I have reviewed the triage vital signs and the nursing notes.  Pertinent labs & imaging results that were available during my care of the patient were reviewed by me and considered in my medical decision making (see chart for details).     Patient afebrile, nontoxic, with SpO2 97%.  Rapid strep negative, culture and Covid PCR pending.  Patient to quarantine until results are back.  We will treat supportively as outlined below.  Return precautions discussed, patient verbalized understanding and is agreeable to plan. Final Clinical Impressions(s) / UC Diagnoses   Final diagnoses:  Encounter for screening laboratory testing for COVID-19 virus  Sore throat     Discharge Instructions     Your rapid strep test was negative today.  The culture is pending.  Please look on your MyChart for test  results.   We will notify you if the culture positive and outline a treatment plan at that time.   Please continue Tylenol and/or Ibuprofen as needed for fever, pain.  May try warm salt water gargles, cepacol lozenges, throat spray, warm tea or water with lemon/honey, or OTC cold relief medicine for throat discomfort.   For congestion: take a daily anti-histamine like Zyrtec, Claritin, and a oral decongestant to help with post nasal drip that may be irritating your throat.   It is important to stay hydrated: drink plenty of fluids (primarily water) to keep your throat moisturized and help further relieve irritation/discomfort.     ED Prescriptions    Medication Sig Dispense Auth. Provider   sucralfate (CARAFATE) 1 GM/10ML suspension Take 10 mLs (1 g total) by mouth 4 (four) times daily -  with meals and at bedtime. 420 mL Hall-Potvin, [place], PA-C   cetirizine (ZYRTEC ALLERGY) 10 MG tablet Take 1 tablet (10 mg total) by mouth daily. 30 tablet Hall-Potvin, [place], PA-C   fluticasone (FLONASE) 50 MCG/ACT nasal spray Place 1 spray into both nostrils daily. 16 g Hall-Potvin, [place], PA-C   benzonatate (TESSALON) 100 MG capsule Take 1 capsule (100 mg total) by mouth every 8 (eight) hours. 21 capsule Hall-Potvin, [place], PA-C     PDMP not reviewed this encounter.   Hall-Potvin, [place], [place] 08/10/20 2006

## 2020-08-10 NOTE — ED Triage Notes (Signed)
Pt c/o sore throat, cough, congestion, headache, and fatigue x1wk, states sore throat worse today.

## 2020-08-10 NOTE — Discharge Instructions (Addendum)

## 2020-08-11 ENCOUNTER — Ambulatory Visit
Admission: RE | Admit: 2020-08-11 | Discharge: 2020-08-11 | Disposition: A | Discharge status: Home / Self Care | Payer: 59 | Source: Ambulatory Visit | Attending: Obstetrics and Gynecology | Admitting: Obstetrics and Gynecology

## 2020-08-11 ENCOUNTER — Other Ambulatory Visit: Payer: Self-pay | Admitting: Obstetrics and Gynecology

## 2020-08-11 DIAGNOSIS — N6310 Unspecified lump in the right breast, unspecified quadrant: Secondary | ICD-10-CM

## 2020-08-12 LAB — SARS-COV-2, NAA 2 DAY TAT

## 2020-08-12 LAB — NOVEL CORONAVIRUS, NAA: SARS-CoV-2, NAA: DETECTED — AB

## 2020-08-15 LAB — CULTURE, GROUP A STREP (THRC)

## 2020-08-16 ENCOUNTER — Ambulatory Visit: Payer: 59 | Admitting: Psychiatry

## 2020-08-30 ENCOUNTER — Other Ambulatory Visit: Payer: Self-pay

## 2020-08-30 ENCOUNTER — Ambulatory Visit (INDEPENDENT_AMBULATORY_CARE_PROVIDER_SITE_OTHER): Payer: 59 | Admitting: Psychiatry

## 2020-08-30 DIAGNOSIS — F411 Generalized anxiety disorder: Secondary | ICD-10-CM | POA: Diagnosis not present

## 2020-08-30 NOTE — Progress Notes (Signed)
[facility] Counselor/Therapist Progress Note  Patient ID: Mariah Young, MRN: [id number],    Date: 08/30/2020  Time Spent: 60 minutes   5:00pm to 6:00pm  Treatment Type: Individual Therapy  Reported Symptoms:anxious/stressed with physical, work, health (Covid and breast lump), and personal issues   Mental Status Exam:  Appearance:   Casual     Behavior:  Appropriate, Sharing and Motivated  Motor:  Normal  Speech/Language:   Clear and Coherent  Affect:  anxious, stressed  Mood:  anxious  Thought process:  goal directed  Thought content:    WNL  Sensory/Perceptual disturbances:    WNL  Orientation:  oriented to person, place, time/date, situation, day of week, month of year and year  Attention:  Good  Concentration:  Good and Fair  Memory:  WNL  Fund of knowledge:   Good  Insight:    Good and Fair  Judgment:   Good  Impulse Control:  Good   Risk Assessment: Danger to Self:  No Self-injurious Behavior: No Danger to Others: No Duty to Warn:no Physical Aggression / Violence:No  Access to Firearms a concern: No  Gang Involvement:No   Subjective: Patient today reporting anxiety and feeling stressed over work, health, and personal stressors.   Interventions: Cognitive Behavioral Therapy and Solution-Oriented/Positive Psychology  Diagnosis:   ICD-10-CM   1. Generalized anxiety disorder  F41.1      Plan of Care: Patient not signing tx plan on computer screen due to Covid.  Treatment Goals: Goals will remain on tx plan as patient works with strategies to achieve her goals. Progress will be noted each session and documented in "Progress" section of goal plan.   Long term goal:(Measurable) 1.Stabilize anxiety level while increasing ability to function on a daily basis. 2. Patient will eventually progress to where she rates her anxiety as a "3" or less on "1-10 Anxiety scale" for at least 2 months.  Short term goal: Increase understanding of beliefs  and messages that produce anxiety, worry,fear, and negativity.  Strategies: Identify, challenge, and replace anxious/fearful/negative with positive, hopeful, and empoweriing self-talk.  Progress: Patient in today reporting anxiety and increased stress re: work, health issues (Covid, breast lump), and other personal concerns.  Today states she is having residual Covid issues and is planning to return to urgent care for follow up, and I encouraged her to return there soon and she agreed. Processed her thoughts and feelings about stressors especially personal, health, and work. Had breast lump, went for ultrasound and got report that it was "fatty tissue and not anything to worry about."  Stressed/anxious about missing work due to being positive for Covid, "stressed because we are already short-staffed. Worked on defusing some of her unrealistic expectations of herself and she was able to re-group and get her expectations more realistic and plans to work on this more between sessions.  Is over-stressed and self-negating with unrealistic expectations since she is not feeling well after recent episode of Covid (but did her quarantine time,is mostly "just feeling worn"). Worked with her long and short term goal and strategy in tx plan above to help her be more focused on her understanding, recognition, and interrupting of her anxious/negative thoughts and self-talk and replacing them with more encouraging, reality-based, and empowering thoughts and self-talk. Situation with BF remains positive and supportive. To continue working on boundaries (without guilt) with family with which she is more comfortable.  Encouraged patient to work on more affirming self care ( physically and emotionally), stay  in the present and focus on what she can impact or control versus cannot, make her physical needs a priority right now since she is still bouncing back "post-Covid", intentionally look for positives vs negatives, and stay  in touch with others who are supportive of her.  Goal review and progress/challenges noted with patient.  Next appointment within 3 weeks.   Mathis Fare, LCSW

## 2020-09-13 ENCOUNTER — Ambulatory Visit (INDEPENDENT_AMBULATORY_CARE_PROVIDER_SITE_OTHER): Payer: 59 | Admitting: Psychiatry

## 2020-09-13 ENCOUNTER — Other Ambulatory Visit: Payer: Self-pay

## 2020-09-13 DIAGNOSIS — F411 Generalized anxiety disorder: Secondary | ICD-10-CM | POA: Diagnosis not present

## 2020-09-13 NOTE — Progress Notes (Signed)
[facility] Counselor/Therapist Progress Note  Patient ID: Mariah Young, MRN: [id number],    Date: 09/13/2020  Time Spent: 60 minutes     5:00pm to 6:00pm  Treatment Type: Individual Therapy  Reported Symptoms: Anxiety, depression, obsessiveness at times, fatigue  Mental Status Exam:  Appearance:   Casual     Behavior:  Appropriate, Sharing and Motivated  Motor:  Normal  Speech/Language:   Clear and Coherent  Affect:  anxious  Mood:  anxious and depressed  Thought process:  goal directed  Thought content:    some obsessiveness  Sensory/Perceptual disturbances:    WNL  Orientation:  oriented to person, place, time/date, situation, day of week, month of year and year  Attention:  Good  Concentration:  Good  Memory:  WNL  Fund of knowledge:   Good  Insight:    Good and Fair  Judgment:   Good and Fair  Impulse Control:  Fair   Risk Assessment: Danger to Self:  No Self-injurious Behavior: No Danger to Others: No Duty to Warn:no Physical Aggression / Violence:No  Access to Firearms a concern: No  Gang Involvement:No   Subjective: Patient today reporting anxiety, depression, fatigue, and some obsessiveness.   Interventions: Cognitive Behavioral Therapy and Solution-Oriented/Positive Psychology  Diagnosis:   ICD-10-CM   1. Generalized anxiety disorder  F41.1      Plan of Care: Patient not signing tx plan on computer screen due to Covid.  Treatment Goals: Goals will remain on tx plan as patient works with strategies to achieve her goals. Progress will be noted each session and documented in "Progress" section of goal plan.   Long term goal:(Measurable) 1.Stabilize anxiety level while increasing ability to function on a daily basis. 2. Patient will eventually progress to where she rates her anxiety as a "3" or less on "1-10 Anxiety scale" for at least 2 months.  Short term goal: Increase understanding of beliefs and messages that produce anxiety,  worry,fear, and negativity.  Strategies: Identify, challenge, and replace anxious/fearful/negative with positive, hopeful, and empoweriing self-talk.  Progress: Patient in today reporting anxiety, depression, obsessiveness at times, and fatigue. Relationship with S.O. still going ok with some occasional challenges that they work through so far.  Her work hours have been much more stressful. Discussed her difficulty managing all the demands on her time this past week, including some needed improvement in self-care. Does seem to have some better communication in her relationship with S.O. and realizing some of their needs for better life balance and some "me time, for each of [place]." Still having some anxious/worrisome/negative thoughts and some unrealistic expectations of herself and we discussed several examples and ways she can ease up on herself that would also contribute to reducing her stress level which she reports has been higher during the time of increased work stress. Worked more with her short and long-term goals and strategy and treatment plan above, helping patient recognize and interrupt more of the anxious/worrisome/negative thoughts and be able to replace them with more accurate, reality based, and empowering thoughts.  She is to continue to work on this between sessions.  Self-negating not quite as bad recently, despite being more stressed.  Also to stay in touch with people who are supportive of her, try staying in the present and focus on what she can control, taking better care of herself physically and emotionally during this high stress time, and intentionally look for and accentuate the positives daily.   Goal review and progress/challenges noted  with patient.  Next appointment within 2 to 3 weeks.   Mathis Fare, LCSW

## 2020-09-27 ENCOUNTER — Ambulatory Visit (INDEPENDENT_AMBULATORY_CARE_PROVIDER_SITE_OTHER): Payer: 59 | Admitting: Psychiatry

## 2020-09-27 ENCOUNTER — Other Ambulatory Visit: Payer: Self-pay

## 2020-09-27 DIAGNOSIS — F411 Generalized anxiety disorder: Secondary | ICD-10-CM

## 2020-09-27 NOTE — Progress Notes (Signed)
[facility] Counselor/Therapist Progress Note  Patient ID: Mariah Young, MRN: [id number],    Date: 09/27/2020  Time Spent: 60 minutes   5:00pm to 6:00pm   Treatment Type: Individual Therapy  Reported Symptoms: anxiety  Mental Status Exam:  Appearance:   Casual     Behavior:  Appropriate, Sharing and Motivated  Motor:  Normal  Speech/Language:   Clear and Coherent  Affect:  anxious  Mood:  anxious  Thought process:  goal directed  Thought content:    some obsessiveness  Sensory/Perceptual disturbances:    WNL  Orientation:  oriented to person, place, time/date, situation, day of week, month of year and year  Attention:  Good  Concentration:  Good and Fair  Memory:  WNL  Fund of knowledge:   Good  Insight:    Good and Fair  Judgment:   Good  Impulse Control:  Good   Risk Assessment: Danger to Self:  No Self-injurious Behavior: No Danger to Others: No Duty to Warn:no Physical Aggression / Violence:No  Access to Firearms a concern: No  Gang Involvement:No   Subjective: Patient today reporting anxiety, some depression, overwhelmed and overworked at job, but things are good with S.O.  Some sleep issues occasionally.    Interventions: Cognitive Behavioral Therapy and Solution-Oriented/Positive Psychology  Diagnosis:   ICD-10-CM   1. Generalized anxiety disorder  F41.1      Plan of Care: Patient not signing tx plan on computer screen due to Covid.  Treatment Goals: Goals will remain on tx plan as patient works with strategies to achieve her goals. Progress will be noted each session and documented in "Progress" section of goal plan.   Long term goal:(Measurable) 1.Stabilize anxiety level while increasing ability to function on a daily basis. 2. Patient will eventually progress to where she rates her anxiety as a "3" or less on "1-10 Anxiety scale" for at least 2 months.  Short term goal: Increase understanding of beliefs and messages that  produce anxiety, worry,fear, and negativity.  Strategies: Identify, challenge, and replace anxious/fearful/negative with positive, hopeful, and empoweriing self-talk.  Progress: And in today reporting anxiety, overworking and overwhelmed, some depression, intermittent tearfulness, "but things with my boyfriend are good." Work has been much more stressful and patient has found it difficult being there.  Applied for a job at [employer] that she "felt would be perfect" so is very disappointed, tearful, and overwhelmed with sadness which she processed a lot of emotions, thoughts, and feelings in session today.  "Worried about some changes coming up at her current job with another employee leaving that will impact her job responsibilities and locations. Tending to only see "negatives" even though she knows of another job she is applying for and already assuming that she won't be selected for that job either. Did eventually let go some of the negative assumptions and think more calmly and rationally about her situation. She is to work between sessions with her long and short term goals and strategy in her tx plan above as she continues to confront her anxious/depressive thoughts and replacing them with more reality-based and empowering thoughts that do not feel her anxiety nor depression.  Discouraged self negating and encouraged self-care with patient.  Also encouraging her to be in contact with people who are supportive of her, try to stay in the present focusing on what she can control versus cannot, and practicing positive self-care especially during this time of disappointment regarding a job and high stress at her  current job, trying to remain free of negative assumptions.  She did seem to end up at a better place by session end, feeling very supported and more grounded as she works to move forward.   Goal review and progress/challenges noted with patient.  Next appointment within 2 to 3  weeks.   Mathis Fare, LCSW

## 2020-10-11 ENCOUNTER — Ambulatory Visit (INDEPENDENT_AMBULATORY_CARE_PROVIDER_SITE_OTHER): Payer: 59 | Admitting: Psychiatry

## 2020-10-11 ENCOUNTER — Other Ambulatory Visit: Payer: Self-pay

## 2020-10-11 DIAGNOSIS — F411 Generalized anxiety disorder: Secondary | ICD-10-CM | POA: Diagnosis not present

## 2020-10-11 NOTE — Progress Notes (Signed)
[facility] Counselor/Therapist Progress Note  Patient ID: Mariah Young, MRN: [id number],    Date: 10/11/2020  Time Spent: 60 minutes   5:00pm to 6:00pm  Treatment Type: Individual Therapy  Reported Symptoms: anxiety, depression  Mental Status Exam:  Appearance:   Casual     Behavior:  Appropriate, Sharing and Motivated  Motor:  Normal  Speech/Language:   Clear and Coherent  Affect:  anxious, depressed  Mood:  anxious and depressed  Thought process:  goal directed  Thought content:    WNL  Sensory/Perceptual disturbances:    WNL  Orientation:  oriented to person, place, time/date, situation, day of week, month of year and year  Attention:  Fair  Concentration:  Fair  Memory:  WNL  Fund of knowledge:   Good  Insight:    Good  Judgment:   Fair  Impulse Control:  Fair   Risk Assessment: Danger to Self:  No Self-injurious Behavior: No Danger to Others: No Duty to Warn:no Physical Aggression / Violence:No  Access to Firearms a concern: No  Gang Involvement:No   Subjective: Today reports anxiety and depression with anxiety being the stronger symptom. States "I'm just trying to balance work, relationships, and want to be happy.  (See Progress note below.)  Interventions: Cognitive Behavioral Therapy, Solution-Oriented/Positive Psychology and Ego-Supportive  Diagnosis:   ICD-10-CM   1. Generalized anxiety disorder  F41.1      Plan of Care: Patient not signing tx plan on computer screen due to Covid.  Treatment Goals: Goals will remain on tx plan as patient works with strategies to achieve her goals. Progress will be noted each session and documented in "Progress" section of goal plan.   Long term goal:(Measurable) 1.Stabilize anxiety level while increasing ability to function on a daily basis. 2. Patient will eventually progress to where she rates her anxiety as a "3" or less on "1-10 Anxiety scale" for at least 2 months.  Short term  goal: Increase understanding of beliefs and messages that produce anxiety, worry,fear, and negativity.  Strategies: Identify, challenge, and replace anxious/fearful/negative with positive, hopeful, and empoweriing self-talk.  Progress: Patient in today reporting anxiety, depression, and frustrated and unhappy. Significant unhappiness in her job and is in midst of looking for other job possibilities. Some tearfulness and shared that she and BF have had some difficult conversations as well which "added to my stress", "but we did end on a better note." Escalating stress with work due to increased hours, commuting out of town to work, and more difficult/problematic circumstances.  Worked with her long and short term goals and strategy in treatment goal plan above, in regards to the anxious/negative/depressive thoughts in being able to interrupt them and replace with more reality-based, hopeful, and empowering thoughts that do not feed her anxiety and depression. Is better today and not automatically taking things in a negative direction, and also not as much self-negating, and encouraged her to continue this. Also encouraging patient to stay in the present focusing on what she can control, maintaining contact with others who are supportive of her, letting go of negative assumptions, practice positive self-care and self-talk, and realize the strength she is showing in trying to work on acquiring some healthier behaviors and thought patterns in the midst of challenging circumstances.   Goal review and progress/challenges noted with patient.  Next appointment within 2 weeks.   Mathis Fare, LCSW

## 2020-10-24 ENCOUNTER — Emergency Department (HOSPITAL_BASED_OUTPATIENT_CLINIC_OR_DEPARTMENT_OTHER)
Admission: EM | Admit: 2020-10-24 | Discharge: 2020-10-24 | Disposition: A | Discharge status: Home / Self Care | Payer: 59 | Attending: Emergency Medicine | Admitting: Emergency Medicine

## 2020-10-24 ENCOUNTER — Encounter (HOSPITAL_BASED_OUTPATIENT_CLINIC_OR_DEPARTMENT_OTHER): Payer: Self-pay | Admitting: Emergency Medicine

## 2020-10-24 ENCOUNTER — Ambulatory Visit (INDEPENDENT_AMBULATORY_CARE_PROVIDER_SITE_OTHER)
Admission: RE | Admit: 2020-10-24 | Discharge: 2020-10-24 | Disposition: A | Discharge status: Home / Self Care | Payer: 59 | Source: Ambulatory Visit

## 2020-10-24 ENCOUNTER — Other Ambulatory Visit: Payer: Self-pay

## 2020-10-24 VITALS — BP 123/84 | HR 115 | Temp 100.0°F | Resp 20

## 2020-10-24 DIAGNOSIS — E860 Dehydration: Secondary | ICD-10-CM

## 2020-10-24 DIAGNOSIS — B2709 Gammaherpesviral mononucleosis with other complications: Secondary | ICD-10-CM | POA: Insufficient documentation

## 2020-10-24 DIAGNOSIS — B2790 Infectious mononucleosis, unspecified without complication: Secondary | ICD-10-CM | POA: Insufficient documentation

## 2020-10-24 DIAGNOSIS — J029 Acute pharyngitis, unspecified: Secondary | ICD-10-CM | POA: Diagnosis present

## 2020-10-24 LAB — POCT MONO SCREEN (KUC): Mono, POC: POSITIVE — AB

## 2020-10-24 LAB — POCT RAPID STREP A (OFFICE): Rapid Strep A Screen: NEGATIVE

## 2020-10-24 MED ORDER — IBUPROFEN 100 MG/5ML PO SUSP
600.0000 mg | Freq: Once | ORAL | Status: AC
  Administered 2020-10-24: 600 mg via ORAL

## 2020-10-24 MED ORDER — SODIUM CHLORIDE 0.9 % IV BOLUS
1000.0000 mL | Freq: Once | INTRAVENOUS | Status: AC
  Administered 2020-10-24: 1000 mL via INTRAVENOUS

## 2020-10-24 MED ORDER — OXYCODONE-ACETAMINOPHEN 5-325 MG PO TABS: 1.0000 | ORAL_TABLET | Freq: Three times a day (TID) | ORAL | 0 refills | Status: DC | PRN

## 2020-10-24 MED ORDER — SODIUM CHLORIDE 0.9 % IV BOLUS
1000.0000 mL | Freq: Once | INTRAVENOUS | Status: AC
Start: 2020-10-24 — End: 2020-10-24
  Administered 2020-10-24: 1000 mL via INTRAVENOUS

## 2020-10-24 MED ORDER — KETOROLAC TROMETHAMINE 15 MG/ML IJ SOLN
15.0000 mg | Freq: Once | INTRAMUSCULAR | Status: AC
  Administered 2020-10-24: 15 mg via INTRAVENOUS
  Filled 2020-10-24: qty 1

## 2020-10-24 NOTE — ED Notes (Signed)
Pt went to the bath room ambulatory

## 2020-10-24 NOTE — ED Notes (Signed)
Vital signs stable. 

## 2020-10-24 NOTE — ED Triage Notes (Signed)
Pt presents with c/o sore throat and fever that began yesterday , last took tylenol last night

## 2020-10-24 NOTE — ED Provider Notes (Signed)
CD CD, IUD in place. [facility]    CSN: [id number] Arrival date & time: 10/24/20  1606      History   Chief Complaint Chief Complaint  Patient presents with  . Sore Throat  . Fever    HPI Mariah Young is a 25 y.o. female sore throat and fevers for 3 days.  Medical history of anxiety, OCD, ADHD.  Notes sharp pain in the back of her throat, fevers as high as 102 at home.  Last dose of antipyretic was 24 hours ago.  Also with body aches.  Generalized fatigue and weakness but no focal weakness or sensation changes in arms or legs.  Denies n/v/d, shortness of breath, chest pain, cough, congestion, facial pain, teeth pain, headaches, loss of taste/smell, swollen lymph nodes, ear pain, trouble swallowing, shortness of breath.    Patient states that 2 weeks ago she was treated by a teledoc online for UTI, BV, yeast.  She was treated with Flagyl, Diflucan, Macrobid.  States she is feeling better from that, no vaginal discharge or urinary symptoms today.  No abdominal pain, change in bowel or bladder function.  HPI  Past Medical History:  Diagnosis Date  . ADHD (attention deficit hyperactivity disorder)   . Anxiety   . Diaphoresis   . Dysmenorrhea   . IUD (intrauterine device) in place   . Obesity (BMI 30.0-34.9)   . Obsessive compulsive disorder     Patient Active Problem List   Diagnosis Date Noted  . Generalized anxiety disorder 06/14/2019  . Obsessive compulsive disorder 06/14/2019  . Panic disorder 06/14/2019  . Attention deficit hyperactivity disorder (ADHD), combined type 12/07/2018  . Anxiety 12/07/2018  . Acute non-recurrent maxillary sinusitis 07/12/2017  . Cough 07/12/2017    Past Surgical History:  Procedure Laterality Date  . BREAST LUMPECTOMY Right 2014  . BREAST SURGERY    . REDUCTION MAMMAPLASTY Bilateral 2013    OB History   No obstetric history on file.      Home Medications    Prior to Admission medications   Medication Sig Start  Date End Date Taking? Authorizing Provider  ALPRAZolam Prudy Feeler) 1 MG tablet Take 1 tablet (1 mg total) by mouth 2 (two) times daily as needed for anxiety. 07/31/20   Mozingo, Thereasa Solo, NP  amphetamine-dextroamphetamine (ADDERALL XR) 25 MG 24 hr capsule Take 2 capsules by mouth daily after breakfast. 07/31/20 08/30/20  Mozingo, Thereasa Solo, NP  amphetamine-dextroamphetamine (ADDERALL XR) 25 MG 24 hr capsule Take 2 capsules by mouth daily after breakfast. 08/28/20   Mozingo, Thereasa Solo, NP  amphetamine-dextroamphetamine (ADDERALL XR) 25 MG 24 hr capsule Take 2 capsules by mouth daily after breakfast. 09/25/20   Mozingo, Thereasa Solo, NP  amphetamine-dextroamphetamine (ADDERALL) 20 MG tablet Take 1 tablet (20 mg total) by mouth daily before supper. 07/31/20 08/30/20  Mozingo, Thereasa Solo, NP  amphetamine-dextroamphetamine (ADDERALL) 20 MG tablet Take 1 tablet (20 mg total) by mouth daily before supper. 08/28/20   Mozingo, Thereasa Solo, NP  amphetamine-dextroamphetamine (ADDERALL) 20 MG tablet Take 1 tablet (20 mg total) by mouth daily before supper. 09/25/20   Mozingo, Thereasa Solo, NP  benzonatate (TESSALON) 100 MG capsule Take 1 capsule (100 mg total) by mouth every 8 (eight) hours. 08/10/20   Hall-Potvin, [place], PA-C  cetirizine (ZYRTEC ALLERGY) 10 MG tablet Take 1 tablet (10 mg total) by mouth daily. 08/10/20   Hall-Potvin, [place], PA-C  desvenlafaxine (PRISTIQ) 50 MG 24 hr tablet Take 1 tablet (50 mg total) by mouth  daily. 06/06/20   Mozingo, Thereasa Solo, NP  Desvenlafaxine Succinate ER (PRISTIQ) 25 MG TB24 Take 25 mg by mouth daily. 05/08/20   Mozingo, Thereasa Solo, NP  fluticasone (FLONASE) 50 MCG/ACT nasal spray Place 1 spray into both nostrils daily. 08/10/20   Hall-Potvin, [place], PA-C  Multiple Vitamin (MULTI-VITAMIN) tablet Take by mouth.    [provider]  sucralfate (CARAFATE) 1 GM/10ML suspension Take 10 mLs (1 g total) by mouth 4 (four) times daily -  with  meals and at bedtime. 08/10/20   Hall-Potvin, [place], PA-C    Family History Family History  Problem Relation Age of Onset  . Hypertension Mother   . Hypertension Father   . Colon cancer Maternal Grandfather   . Lung cancer Paternal Grandfather     Social History Social History   Tobacco Use  . Smoking status: Never Smoker  . Smokeless tobacco: Never Used  Vaping Use  . Vaping Use: Never used  Substance Use Topics  . Alcohol use: Yes  . Drug use: Never     Allergies   Clarithromycin and Penicillins   Review of Systems Review of Systems  Constitutional: Positive for fatigue and fever. Negative for appetite change and chills.  HENT: Positive for sore throat. Negative for congestion, ear pain, rhinorrhea, sinus pressure and sinus pain.   Eyes: Negative for redness and visual disturbance.  Respiratory: Negative for cough, chest tightness, shortness of breath and wheezing.   Cardiovascular: Negative for chest pain and palpitations.  Gastrointestinal: Negative for abdominal pain, constipation, diarrhea, nausea and vomiting.  Genitourinary: Negative for dysuria, frequency and urgency.  Musculoskeletal: Negative for myalgias.  Neurological: Negative for dizziness, weakness and headaches.  Psychiatric/Behavioral: Negative for confusion.  All other systems reviewed and are negative.    Physical Exam Triage Vital Signs ED Triage Vitals  Enc Vitals Group     BP 10/24/20 1624 123/84     Pulse Rate 10/24/20 1624 (!) 115     Resp 10/24/20 1624 20     Temp 10/24/20 1624 (!) 102.8 F (39.3 C)     Temp Source 10/24/20 1624 Oral     SpO2 10/24/20 1624 99 %     Weight --      Height --      Head Circumference --      Peak Flow --      Pain Score 10/24/20 1630 9     Pain Loc --      Pain Edu? --      Excl. in GC? --    No data found.  Updated Vital Signs BP 123/84 (BP Location: Left Arm)   Pulse (!) 115   Temp 100 F (37.8 C) (Axillary) Comment: pt drinking water   Resp 20   SpO2 99%   Visual Acuity Right Eye Distance:   Left Eye Distance:   Bilateral Distance:    Right Eye Near:   Left Eye Near:    Bilateral Near:     Physical Exam Vitals reviewed.  Constitutional:      General: She is not in acute distress.    Appearance: Normal appearance. She is well-developed. She is ill-appearing.  HENT:     Head: Normocephalic and atraumatic.     Right Ear: Hearing, tympanic membrane, ear canal and external ear normal. No swelling or tenderness. There is no impacted cerumen. No mastoid tenderness. Tympanic membrane is not perforated, erythematous, retracted or bulging.     Left Ear: Hearing, tympanic membrane, ear canal and  external ear normal. No swelling or tenderness. There is no impacted cerumen. No mastoid tenderness. Tympanic membrane is not perforated, erythematous, retracted or bulging.     Nose:     Right Sinus: No maxillary sinus tenderness or frontal sinus tenderness.     Left Sinus: No maxillary sinus tenderness or frontal sinus tenderness.     Mouth/Throat:     Mouth: Mucous membranes are moist.     Pharynx: Uvula midline. No oropharyngeal exudate or posterior oropharyngeal erythema.     Tonsils: No tonsillar exudate. 2+ on the right. 2+ on the left.     Comments: Uvula midline, palate raises symmetrically. No exudate.  No trismus.  Handling secretions without difficulty.  Normal phonation. Cardiovascular:     Rate and Rhythm: Normal rate and regular rhythm.     Heart sounds: Normal heart sounds.  Pulmonary:     Breath sounds: Normal breath sounds and air entry. No wheezing, rhonchi or rales.  Chest:     Chest wall: No tenderness.  Abdominal:     General: Abdomen is flat. Bowel sounds are normal.     Tenderness: There is no abdominal tenderness. There is no guarding or rebound.  Lymphadenopathy:     Cervical: Cervical adenopathy present.     Right cervical: Posterior cervical adenopathy present. No deep cervical adenopathy.    Left  cervical: Posterior cervical adenopathy present. No deep cervical adenopathy.  Skin:    General: Skin is warm.     Capillary Refill: Capillary refill takes 2 to 3 seconds.  Neurological:     General: No focal deficit present.     Mental Status: She is alert and oriented to person, place, and time.     Cranial Nerves: Cranial nerves are intact. No cranial nerve deficit.     Sensory: Sensation is intact.     Motor: Motor function is intact. No weakness.     Coordination: Coordination is intact. Romberg sign negative.     Gait: Gait is intact. Gait normal.     Comments: CN 2-12 grossly intact  Psychiatric:        Attention and Perception: Attention and perception normal.        Mood and Affect: Mood and affect normal.        Behavior: Behavior normal. Behavior is cooperative.        Thought Content: Thought content normal.        Judgment: Judgment normal.      UC Treatments / Results  Labs (all labs ordered are listed, but only abnormal results are displayed) Labs Reviewed  POCT MONO SCREEN ([facility]) - Abnormal; Notable for the following components:      Result Value   Mono, POC Positive (*)    All other components within normal limits  CULTURE, GROUP A STREP [facility])  POCT RAPID STREP A (OFFICE)    EKG   Radiology No results found.  Procedures Procedures (including critical care time)  Medications Ordered in UC Medications  ibuprofen (ADVIL) 100 MG/5ML suspension 600 mg (600 mg Oral Given 10/24/20 1638)    Initial Impression / Assessment and Plan / UC Course  I have reviewed the triage vital signs and the nursing notes.  Pertinent labs & imaging results that were available during my care of the patient were reviewed by me and considered in my medical decision making (see chart for details).     This patient is a 25 year old female presenting with mono. This patient is initially tachycardic and  febrile; after administration of ibuprofen, fever reduced to 100F. Still  tachycardic at 115.  Rapid strep negative, culture sent. Rapid mono positive.  Given stable vitals and patient appears fairly well hydrated, I believe it would be reasonable to rehydrated by mouth. However, patient verbalizes that she wishes to head to the ED for IV rehydration. She is hemodynamically stable to transport herself at this time. I will defer management of mono symptoms (ie sore throat) to ED provider.   This chart was dictated using voice recognition software, Dragon. Despite the best efforts of this provider to proofread and correct errors, errors may still occur which can change documentation meaning.   Final Clinical Impressions(s) / UC Diagnoses   Final diagnoses:  Dehydration  Gammaherpesviral mononucleosis with other complications     Discharge Instructions     -Head to the emergency room for further evaluation and management of dehydration and mono. -I will defer management of mono symptoms to ED provider.  They will likely prescribe something to help with the sore throat. -If you experience dizziness, weakness, chest pain, shortness of breath while on the way to the emergency room-stop and call 911 immediately.    ED Prescriptions    None     PDMP not reviewed this encounter.   Rhys Martini, PA-C 10/24/20 1818

## 2020-10-24 NOTE — Discharge Instructions (Signed)
Try and keep yourself hydrated.  Follow-up with your doctor as needed.  Watch for abdominal pain or worsening lightheadedness and dizziness

## 2020-10-24 NOTE — Discharge Instructions (Signed)
-  Head to the emergency room for further evaluation and management of dehydration and mono. -I will defer management of mono symptoms to ED provider.  They will likely prescribe something to help with the sore throat. -If you experience dizziness, weakness, chest pain, shortness of breath while on the way to the emergency room-stop and call 911 immediately.

## 2020-10-24 NOTE — ED Provider Notes (Signed)
[facility] EMERGENCY DEPT Provider Note   CSN: [id number] Arrival date & time: 10/24/20  1835     History No chief complaint on file.   Mariah Young is a 25 y.o. female.  HPI Patient presents for possible dehydration.  Has had sore throat recently.  Got seen at urgent care and had positive mono test.  Also reportedly has had some difficulty eating due to the sore throat.  Has had some nausea and vomiting feels dehydration.  Has had recent UTI treatment.  Also been treated for yeast infection coming after the antibiotics.  Also reportedly been treated for bacterial vaginosis.  Sore throat with swelling.  Negative strep test.  States she was told to come here because she is very dehydrated.  Reviewing notes from urgent care it appears that if they thought she could tolerate oral hydration but I think with her sore throat    Past Medical History:  Diagnosis Date  . ADHD (attention deficit hyperactivity disorder)   . Anxiety   . Diaphoresis   . Dysmenorrhea   . IUD (intrauterine device) in place   . Obesity (BMI 30.0-34.9)   . Obsessive compulsive disorder     Patient Active Problem List   Diagnosis Date Noted  . Generalized anxiety disorder 06/14/2019  . Obsessive compulsive disorder 06/14/2019  . Panic disorder 06/14/2019  . Attention deficit hyperactivity disorder (ADHD), combined type 12/07/2018  . Anxiety 12/07/2018  . Acute non-recurrent maxillary sinusitis 07/12/2017  . Cough 07/12/2017    Past Surgical History:  Procedure Laterality Date  . BREAST LUMPECTOMY Right 2014  . BREAST SURGERY    . REDUCTION MAMMAPLASTY Bilateral 2013     OB History   No obstetric history on file.     Family History  Problem Relation Age of Onset  . Hypertension Mother   . Hypertension Father   . Colon cancer Maternal Grandfather   . Lung cancer Paternal Grandfather     Social History   Tobacco Use  . Smoking status: Never Smoker  . Smokeless tobacco:  Never Used  Vaping Use  . Vaping Use: Never used  Substance Use Topics  . Alcohol use: Yes  . Drug use: Never    Home Medications Prior to Admission medications   Medication Sig Start Date End Date Taking? Authorizing Provider  oxyCODONE-acetaminophen (PERCOCET/ROXICET) 5-325 MG tablet Take 1-2 tablets by mouth every 8 (eight) hours as needed for severe pain. 10/24/20  Yes Benjiman Core, MD  ALPRAZolam Prudy Feeler) 1 MG tablet Take 1 tablet (1 mg total) by mouth 2 (two) times daily as needed for anxiety. 07/31/20   Mozingo, Thereasa Solo, NP  amphetamine-dextroamphetamine (ADDERALL XR) 25 MG 24 hr capsule Take 2 capsules by mouth daily after breakfast. 07/31/20 08/30/20  Mozingo, Thereasa Solo, NP  amphetamine-dextroamphetamine (ADDERALL XR) 25 MG 24 hr capsule Take 2 capsules by mouth daily after breakfast. 08/28/20   Mozingo, Thereasa Solo, NP  amphetamine-dextroamphetamine (ADDERALL XR) 25 MG 24 hr capsule Take 2 capsules by mouth daily after breakfast. 09/25/20   Mozingo, Thereasa Solo, NP  amphetamine-dextroamphetamine (ADDERALL) 20 MG tablet Take 1 tablet (20 mg total) by mouth daily before supper. 07/31/20 08/30/20  Mozingo, Thereasa Solo, NP  amphetamine-dextroamphetamine (ADDERALL) 20 MG tablet Take 1 tablet (20 mg total) by mouth daily before supper. 08/28/20   Mozingo, Thereasa Solo, NP  amphetamine-dextroamphetamine (ADDERALL) 20 MG tablet Take 1 tablet (20 mg total) by mouth daily before supper. 09/25/20   Mozingo, Thereasa Solo, NP  benzonatate (TESSALON)  100 MG capsule Take 1 capsule (100 mg total) by mouth every 8 (eight) hours. 08/10/20   Hall-Potvin, [place], PA-C  cetirizine (ZYRTEC ALLERGY) 10 MG tablet Take 1 tablet (10 mg total) by mouth daily. 08/10/20   Hall-Potvin, [place], PA-C  desvenlafaxine (PRISTIQ) 50 MG 24 hr tablet Take 1 tablet (50 mg total) by mouth daily. 06/06/20   Mozingo, Thereasa Solo, NP  Desvenlafaxine Succinate ER (PRISTIQ) 25 MG TB24 Take 25 mg by mouth  daily. 05/08/20   Mozingo, Thereasa Solo, NP  fluticasone (FLONASE) 50 MCG/ACT nasal spray Place 1 spray into both nostrils daily. 08/10/20   Hall-Potvin, [place], PA-C  Multiple Vitamin (MULTI-VITAMIN) tablet Take by mouth.    [provider]  sucralfate (CARAFATE) 1 GM/10ML suspension Take 10 mLs (1 g total) by mouth 4 (four) times daily -  with meals and at bedtime. 08/10/20   Hall-Potvin, [place], PA-C    Allergies    Clarithromycin and Penicillins  Review of Systems   Review of Systems  Constitutional: Positive for appetite change, fatigue and fever.  HENT: Positive for sore throat.   Respiratory: Negative for shortness of breath.   Cardiovascular: Negative for chest pain.  Gastrointestinal: Positive for nausea. Negative for abdominal pain.  Genitourinary: Negative for flank pain.  Musculoskeletal: Positive for myalgias. Negative for back pain.  Skin: Negative for rash.  Neurological: Negative for weakness.  Psychiatric/Behavioral: Negative for confusion.    Physical Exam Updated Vital Signs BP 110/73   Pulse 94   Temp 98.1 F (36.7 C) (Oral)   Resp 18   Ht 5\' 6"  (1.676 m)   Wt 83.9 kg   SpO2 100%   BMI 29.86 kg/m   Physical Exam Vitals and nursing note reviewed.  HENT:     Head: Atraumatic.     Mouth/Throat:     Pharynx: Oropharyngeal exudate and posterior oropharyngeal erythema present.     Comments: Bilateral tonsillar swelling. Eyes:     Pupils: Pupils are equal, round, and reactive to light.  Cardiovascular:     Rate and Rhythm: Regular rhythm.  Pulmonary:     Breath sounds: No wheezing or rhonchi.  Abdominal:     Palpations: There is no mass.     Tenderness: There is no abdominal tenderness.  Musculoskeletal:        General: No tenderness.  Skin:    General: Skin is warm.     Capillary Refill: Capillary refill takes less than 2 seconds.     Coloration: Skin is not jaundiced.  Neurological:     Mental Status: She is alert and oriented to  person, place, and time.  Psychiatric:        Mood and Affect: Mood normal.     ED Results / Procedures / Treatments   Labs (all labs ordered are listed, but only abnormal results are displayed) Labs Reviewed - No data to display The EKG None  Radiology No results found.  Procedures Procedures   Medications Ordered in ED Medications  ketorolac (TORADOL) 15 MG/ML injection 15 mg (15 mg Intravenous Given 10/24/20 1918)  sodium chloride 0.9 % bolus 1,000 mL (0 mLs Intravenous Stopped 10/24/20 2048)  sodium chloride 0.9 % bolus 1,000 mL (1,000 mLs Intravenous New Bag/Given 10/24/20 2045)    ED Course  I have reviewed the triage vital signs and the nursing notes.  Pertinent labs & imaging results that were available during my care of the patient were reviewed by me and considered in my medical decision making (  see chart for details).    MDM Rules/Calculators/A&P                          Patient with sore throat.  Seen in urgent care and found to have mononucleosis.  Difficulty tolerating orals due to the pain.  Feels better after treatment.  Fluid boluses given.  Blood pressure improved.  Patient feels better.  Benign abdominal exam.  No jaundice.  I think patient is stable for discharge.  Symptomatic treatment with pain medicines.  Outpatient follow-up as needed. Final Clinical Impression(s) / ED Diagnoses Final diagnoses:  Infectious mononucleosis without complication, infectious mononucleosis due to unspecified organism    Rx / DC Orders ED Discharge Orders         Ordered    oxyCODONE-acetaminophen (PERCOCET/ROXICET) 5-325 MG tablet  Every 8 hours PRN        10/24/20 2207           Benjiman Core, MD 10/24/20 2254

## 2020-10-24 NOTE — ED Triage Notes (Addendum)
Vomited yesterday was seen at [facility] today and dx with mono , here for dehydration , was stuck several times at [facility] and she got lightheaded given motrin at [facility] for sorethroat

## 2020-10-25 ENCOUNTER — Ambulatory Visit: Payer: 59 | Admitting: Psychiatry

## 2020-10-27 ENCOUNTER — Other Ambulatory Visit: Payer: Self-pay

## 2020-10-27 ENCOUNTER — Encounter (HOSPITAL_BASED_OUTPATIENT_CLINIC_OR_DEPARTMENT_OTHER): Payer: Self-pay | Admitting: Emergency Medicine

## 2020-10-27 ENCOUNTER — Emergency Department (HOSPITAL_BASED_OUTPATIENT_CLINIC_OR_DEPARTMENT_OTHER)
Admission: EM | Admit: 2020-10-27 | Discharge: 2020-10-27 | Disposition: A | Discharge status: Home / Self Care | Payer: 59 | Attending: Emergency Medicine | Admitting: Emergency Medicine

## 2020-10-27 ENCOUNTER — Emergency Department (HOSPITAL_BASED_OUTPATIENT_CLINIC_OR_DEPARTMENT_OTHER): Payer: 59

## 2020-10-27 DIAGNOSIS — B2790 Infectious mononucleosis, unspecified without complication: Secondary | ICD-10-CM

## 2020-10-27 DIAGNOSIS — J029 Acute pharyngitis, unspecified: Secondary | ICD-10-CM | POA: Diagnosis present

## 2020-10-27 LAB — CBC WITH DIFFERENTIAL/PLATELET
Abs Immature Granulocytes: 0.03 K/uL (ref 0.00–0.07)
Basophils Absolute: 0 K/uL (ref 0.0–0.1)
Basophils Relative: 0 %
Eosinophils Absolute: 0.2 K/uL (ref 0.0–0.5)
Eosinophils Relative: 2 %
HCT: 41.3 % (ref 36.0–46.0)
Hemoglobin: 13.9 g/dL (ref 12.0–15.0)
Immature Granulocytes: 0 %
Lymphocytes Relative: 26 %
Lymphs Abs: 2 K/uL (ref 0.7–4.0)
MCH: 30.6 pg (ref 26.0–34.0)
MCHC: 33.7 g/dL (ref 30.0–36.0)
MCV: 91 fL (ref 80.0–100.0)
Monocytes Absolute: 0.5 K/uL (ref 0.1–1.0)
Monocytes Relative: 6 %
Neutro Abs: 5.1 K/uL (ref 1.7–7.7)
Neutrophils Relative %: 66 %
Platelets: 200 K/uL (ref 150–400)
RBC: 4.54 MIL/uL (ref 3.87–5.11)
RDW: 12.6 % (ref 11.5–15.5)
WBC: 7.8 K/uL (ref 4.0–10.5)
nRBC: 0 % (ref 0.0–0.2)

## 2020-10-27 LAB — COMPREHENSIVE METABOLIC PANEL
ALT: 24 U/L (ref 0–44)
AST: 17 U/L (ref 15–41)
Albumin: 4.4 g/dL (ref 3.5–5.0)
Alkaline Phosphatase: 49 U/L (ref 38–126)
Anion gap: 11 (ref 5–15)
BUN: 8 mg/dL (ref 6–20)
CO2: 28 mmol/L (ref 22–32)
Calcium: 9.4 mg/dL (ref 8.9–10.3)
Chloride: 96 mmol/L — ABNORMAL LOW (ref 98–111)
Creatinine, Ser: 0.63 mg/dL (ref 0.44–1.00)
GFR, Estimated: >60 mL/min (ref >60)
Glucose, Bld: 92 mg/dL (ref 70–99)
Potassium: 4 mmol/L (ref 3.5–5.1)
Sodium: 135 mmol/L (ref 135–145)
Total Bilirubin: 0.5 mg/dL (ref 0.3–1.2)
Total Protein: 8.2 g/dL — ABNORMAL HIGH (ref 6.5–8.1)

## 2020-10-27 LAB — CULTURE, GROUP A STREP (THRC)

## 2020-10-27 MED ORDER — DEXAMETHASONE SODIUM PHOSPHATE 10 MG/ML IJ SOLN
10.0000 mg | Freq: Once | INTRAMUSCULAR | Status: AC
  Administered 2020-10-27: 10 mg via INTRAVENOUS
  Filled 2020-10-27: qty 1

## 2020-10-27 MED ORDER — SODIUM CHLORIDE 0.9 % IV BOLUS
1000.0000 mL | Freq: Once | INTRAVENOUS | Status: AC
  Administered 2020-10-27: 1000 mL via INTRAVENOUS

## 2020-10-27 MED ORDER — IBUPROFEN 100 MG/5ML PO SUSP
ORAL | Status: AC
  Filled 2020-10-27: qty 40

## 2020-10-27 MED ORDER — ONDANSETRON HCL 4 MG/2ML IJ SOLN
4.0000 mg | Freq: Once | INTRAMUSCULAR | Status: AC
  Administered 2020-10-27: 4 mg via INTRAVENOUS
  Filled 2020-10-27: qty 2

## 2020-10-27 MED ORDER — IOHEXOL 300 MG/ML  SOLN
75.0000 mL | Freq: Once | INTRAMUSCULAR | Status: AC | PRN
  Administered 2020-10-27: 75 mL via INTRAVENOUS

## 2020-10-27 MED ORDER — IBUPROFEN 100 MG/5ML PO SUSP
600.0000 mg | Freq: Once | ORAL | Status: AC
  Administered 2020-10-27: 600 mg via ORAL

## 2020-10-27 NOTE — ED Provider Notes (Signed)
[facility] EMERGENCY DEPT Provider Note   CSN: [id number] Arrival date & time: 10/27/20  1932     History Chief Complaint  Patient presents with  . Sore Throat    Mariah Young is a 25 y.o. female.  Patient is a 25 year old female who presents with sore throat and fever.  She has been having a sore throat for the last 3 to 4 days.  She was seen in urgent care and subsequently emergency department on April 5 and was diagnosed with mono.  She had a negative group A strep culture.  She was given oxycodone for pain.  She said that her symptoms of gotten worse with increased throat swelling and difficulty swallowing.  She is continuing to have fevers.  She has had some nausea but no vomiting.  No abdominal pain.  She generally feels fatigued.        Past Medical History:  Diagnosis Date  . ADHD (attention deficit hyperactivity disorder)   . Anxiety   . Diaphoresis   . Dysmenorrhea   . IUD (intrauterine device) in place   . Obesity (BMI 30.0-34.9)   . Obsessive compulsive disorder     Patient Active Problem List   Diagnosis Date Noted  . Generalized anxiety disorder 06/14/2019  . Obsessive compulsive disorder 06/14/2019  . Panic disorder 06/14/2019  . Attention deficit hyperactivity disorder (ADHD), combined type 12/07/2018  . Anxiety 12/07/2018  . Acute non-recurrent maxillary sinusitis 07/12/2017  . Cough 07/12/2017    Past Surgical History:  Procedure Laterality Date  . BREAST LUMPECTOMY Right 2014  . BREAST SURGERY    . REDUCTION MAMMAPLASTY Bilateral 2013     OB History   No obstetric history on file.     Family History  Problem Relation Age of Onset  . Hypertension Mother   . Hypertension Father   . Colon cancer Maternal Grandfather   . Lung cancer Paternal Grandfather     Social History   Tobacco Use  . Smoking status: Never Smoker  . Smokeless tobacco: Never Used  Vaping Use  . Vaping Use: Never used  Substance Use Topics  .  Alcohol use: Yes  . Drug use: Never    Home Medications Prior to Admission medications   Medication Sig Start Date End Date Taking? Authorizing Provider  ALPRAZolam Prudy Feeler) 1 MG tablet Take 1 tablet (1 mg total) by mouth 2 (two) times daily as needed for anxiety. 07/31/20   Mozingo, Thereasa Solo, NP  amphetamine-dextroamphetamine (ADDERALL XR) 25 MG 24 hr capsule Take 2 capsules by mouth daily after breakfast. 07/31/20 08/30/20  Mozingo, Thereasa Solo, NP  amphetamine-dextroamphetamine (ADDERALL XR) 25 MG 24 hr capsule Take 2 capsules by mouth daily after breakfast. 08/28/20   Mozingo, Thereasa Solo, NP  amphetamine-dextroamphetamine (ADDERALL XR) 25 MG 24 hr capsule Take 2 capsules by mouth daily after breakfast. 09/25/20   Mozingo, Thereasa Solo, NP  amphetamine-dextroamphetamine (ADDERALL) 20 MG tablet Take 1 tablet (20 mg total) by mouth daily before supper. 07/31/20 08/30/20  Mozingo, Thereasa Solo, NP  amphetamine-dextroamphetamine (ADDERALL) 20 MG tablet Take 1 tablet (20 mg total) by mouth daily before supper. 08/28/20   Mozingo, Thereasa Solo, NP  amphetamine-dextroamphetamine (ADDERALL) 20 MG tablet Take 1 tablet (20 mg total) by mouth daily before supper. 09/25/20   Mozingo, Thereasa Solo, NP  benzonatate (TESSALON) 100 MG capsule Take 1 capsule (100 mg total) by mouth every 8 (eight) hours. 08/10/20   Hall-Potvin, [place], PA-C  cetirizine (ZYRTEC ALLERGY) 10 MG tablet Take  1 tablet (10 mg total) by mouth daily. 08/10/20   Hall-Potvin, [place], PA-C  desvenlafaxine (PRISTIQ) 50 MG 24 hr tablet Take 1 tablet (50 mg total) by mouth daily. 06/06/20   Mozingo, Thereasa Solo, NP  Desvenlafaxine Succinate ER (PRISTIQ) 25 MG TB24 Take 25 mg by mouth daily. 05/08/20   Mozingo, Thereasa Solo, NP  fluticasone (FLONASE) 50 MCG/ACT nasal spray Place 1 spray into both nostrils daily. 08/10/20   Hall-Potvin, [place], PA-C  Multiple Vitamin (MULTI-VITAMIN) tablet Take by mouth.    [provider]  oxyCODONE-acetaminophen (PERCOCET/ROXICET) 5-325 MG tablet Take 1-2 tablets by mouth every 8 (eight) hours as needed for severe pain. 10/24/20   Benjiman Core, MD  sucralfate (CARAFATE) 1 GM/10ML suspension Take 10 mLs (1 g total) by mouth 4 (four) times daily -  with meals and at bedtime. 08/10/20   Hall-Potvin, [place], PA-C    Allergies    Clarithromycin and Penicillins  Review of Systems   Review of Systems  Constitutional: Positive for fatigue and fever. Negative for chills and diaphoresis.  HENT: Positive for sore throat and trouble swallowing. Negative for congestion, rhinorrhea and sneezing.   Eyes: Negative.   Respiratory: Negative for cough, chest tightness and shortness of breath.   Cardiovascular: Negative for chest pain and leg swelling.  Gastrointestinal: Positive for nausea. Negative for abdominal pain, blood in stool, diarrhea and vomiting.  Genitourinary: Negative for difficulty urinating, flank pain, frequency and hematuria.  Musculoskeletal: Negative for arthralgias and back pain.  Skin: Negative for rash.  Neurological: Negative for dizziness, speech difficulty, weakness, numbness and headaches.    Physical Exam Updated Vital Signs BP 102/69   Pulse 77   Temp 99 F (37.2 C) (Oral)   Resp 18   Ht 5\' 6"  (1.676 m)   Wt 79.4 kg   SpO2 97%   BMI 28.25 kg/m   Physical Exam Constitutional:      Appearance: She is well-developed.  HENT:     Head: Normocephalic and atraumatic.     Mouth/Throat:     Comments: Patient has swelling to the posterior pharynx bilaterally with exudates, uvula is midline, there are some mild peritonsillar fullness bilaterally, no trismus Eyes:     Pupils: Pupils are equal, round, and reactive to light.  Cardiovascular:     Rate and Rhythm: Normal rate and regular rhythm.     Heart sounds: Normal heart sounds.  Pulmonary:     Effort: Pulmonary effort is normal. No respiratory distress.     Breath sounds: Normal  breath sounds. No wheezing or rales.  Chest:     Chest wall: No tenderness.  Abdominal:     General: Bowel sounds are normal.     Palpations: Abdomen is soft.     Tenderness: There is no abdominal tenderness. There is no guarding or rebound.  Musculoskeletal:        General: Normal range of motion.     Cervical back: Normal range of motion and neck supple.  Lymphadenopathy:     Cervical: No cervical adenopathy.  Skin:    General: Skin is warm and dry.     Findings: No rash.  Neurological:     Mental Status: She is alert and oriented to person, place, and time.     ED Results / Procedures / Treatments   Labs (all labs ordered are listed, but only abnormal results are displayed) Labs Reviewed  COMPREHENSIVE METABOLIC PANEL - Abnormal; Notable for the following components:  Result Value   Chloride 96 (*)    Total Protein 8.2 (*)    All other components within normal limits  CBC WITH DIFFERENTIAL/PLATELET    EKG None  Radiology CT Soft Tissue Neck W Contrast  Result Date: 10/27/2020 CLINICAL DATA:  Initial evaluation for acute sore throat. EXAM: CT NECK WITH CONTRAST TECHNIQUE: Multidetector CT imaging of the neck was performed using the standard protocol following the bolus administration of intravenous contrast. CONTRAST:  47mL OMNIPAQUE IOHEXOL 300 MG/ML  SOLN COMPARISON:  None available. FINDINGS: Pharynx and larynx: Oral cavity within normal limits. No acute abnormality about the dentition. Palatine and lingual tonsils are hypertrophied and hyperenhancing, suggesting acute tonsillitis. No discrete tonsillar or peritonsillar abscess. Parapharyngeal fat maintained. Associated mild mucosal edema within the oropharynx, suggesting associated pharyngitis. Nasopharynx normal. No retropharyngeal collection or swelling. Epiglottis within normal limits. Remainder of the hypopharynx and supraglottic larynx within normal limits. Glottis normal. Subglottic airway patent clear. Salivary  glands: Parotid and submandibular glands are normal. Thyroid: Normal. Lymph nodes: Enlarged bilateral level II lymph nodes measure up to 1.7 cm on the left and 1.4 cm on the right, presumably reactive. No other enlarged or pathologic adenopathy within the neck. Vascular: Normal intravascular enhancement seen throughout the neck. Limited intracranial: Unremarkable. Visualized orbits: Unremarkable. Mastoids and visualized paranasal sinuses: Visualized paranasal sinuses are clear. Visualized mastoids and middle ear cavities are well pneumatized and free of fluid. Skeleton: No acute osseous finding. No discrete or worrisome osseous lesions. Upper chest: Visualized upper chest demonstrates no acute finding. Partially visualized lungs are clear. Other: None. IMPRESSION: 1. Findings consistent with acute tonsillitis/pharyngitis as above. No discrete tonsillar or peritonsillar abscess. 2. Enlarged bilateral level II lymph nodes, presumably reactive. Electronically Signed   By: Rise Mu M.D.   On: 10/27/2020 21:37    Procedures Procedures   Medications Ordered in ED Medications  ibuprofen (ADVIL) 100 MG/5ML suspension (  Not Given 10/27/20 2002)  ibuprofen (ADVIL) 100 MG/5ML suspension 600 mg (600 mg Oral Given 10/27/20 2002)  iohexol (OMNIPAQUE) 300 MG/ML solution 75 mL (75 mLs Intravenous Contrast Given 10/27/20 2119)  dexamethasone (DECADRON) injection 10 mg (10 mg Intravenous Given 10/27/20 2126)  sodium chloride 0.9 % bolus 1,000 mL (0 mLs Intravenous Stopped 10/27/20 2234)  ondansetron (ZOFRAN) injection 4 mg (4 mg Intravenous Given 10/27/20 2126)    ED Course  I have reviewed the triage vital signs and the nursing notes.  Pertinent labs & imaging results that were available during my care of the patient were reviewed by me and considered in my medical decision making (see chart for details).    MDM Rules/Calculators/A&P                          Patient presents with increased throat swelling  difficulty swallowing after being diagnosed with mono recently.  She is continuing to have fevers.  She has had some nausea.  She was given IV fluids and a dose of Decadron as well as some Zofran.  She had a CT scan of her neck which showed no evidence of peritonsillar abscess or other deep tissue infection.  Her labs are nonconcerning.  She says she is actually feeling a lot better after the Decadron and fluids.  She is able to drink without too much difficulty.  She says she swallowing better.  She was discharged home in good condition.  Symptomatic care instructions were given.  Return precautions were given. Final Clinical Impression(s) /  ED Diagnoses Final diagnoses:  Infectious mononucleosis without complication, infectious mononucleosis due to unspecified organism    Rx / DC Orders ED Discharge Orders    None       Rolan BuccoBelfi, Yasmene Salomone, MD 10/27/20 2302

## 2020-10-27 NOTE — ED Triage Notes (Signed)
Emergency Medicine Provider Triage Evaluation Note  Mariah Young , a 25 y.o. female  was evaluated in triage.  Pt complains of worsening sore throat, trouble swallowing.  Review of Systems  Positive: Sore throat, nausea, fever Negative: vomiting  Physical Exam  BP 127/84 (BP Location: Left Arm)   Pulse (!) 110   Temp (!) 101.9 F (38.8 C) (Oral)   Resp 20   Ht 5\' 6"  (1.676 m)   Wt 79.4 kg   SpO2 100%   BMI 28.25 kg/m  Gen:   Awake, no distress    HEENT:  Atraumatic  , diffusely swollen tonsils Resp:  Normal effort   Cardiac:  Normal rate   Abd:   Nondistended, nontender   MSK:   Moves extremities without difficulty   Neuro:  Speech clear    Medical Decision Making  Medically screening exam initiated at 7:54 PM.  Appropriate orders placed.  Mariah Young was informed that the remainder of the evaluation will be completed by another provider, this initial triage assessment does not replace that evaluation, and the importance of remaining in the ED until their evaluation is complete.  Clinical Impression  Sore throat   Guy Begin, MD 10/27/20 12/27/20

## 2020-10-27 NOTE — ED Triage Notes (Signed)
  Patient comes in with sore throat that has been going on for 3 days.  Patient was seen and diagnosed with Mononucleosis on 4/5 and given IVF, toradol, and prescription for percocet.  Patient states it feels like the swelling in her throat is getting worse and having a hard time swallowing.  Patient does not have any respiratory distress.  SPO2 99% on RA.  Patient endorses increased oral secretions and fever.  Has not been taking tylenol or ibuprofen for fever, only percocet for pain.  Last percocet was 1300.  Pain 8/10.

## 2020-10-30 ENCOUNTER — Ambulatory Visit: Payer: 59 | Admitting: Adult Health

## 2020-11-02 ENCOUNTER — Emergency Department (HOSPITAL_BASED_OUTPATIENT_CLINIC_OR_DEPARTMENT_OTHER)
Admission: EM | Admit: 2020-11-02 | Discharge: 2020-11-02 | Disposition: A | Discharge status: Home / Self Care | Payer: 59 | Attending: Emergency Medicine | Admitting: Emergency Medicine

## 2020-11-02 ENCOUNTER — Encounter (HOSPITAL_BASED_OUTPATIENT_CLINIC_OR_DEPARTMENT_OTHER): Payer: Self-pay | Admitting: *Deleted

## 2020-11-02 ENCOUNTER — Emergency Department (HOSPITAL_BASED_OUTPATIENT_CLINIC_OR_DEPARTMENT_OTHER): Payer: 59

## 2020-11-02 ENCOUNTER — Other Ambulatory Visit: Payer: Self-pay

## 2020-11-02 DIAGNOSIS — J029 Acute pharyngitis, unspecified: Secondary | ICD-10-CM | POA: Insufficient documentation

## 2020-11-02 DIAGNOSIS — R319 Hematuria, unspecified: Secondary | ICD-10-CM

## 2020-11-02 DIAGNOSIS — R1012 Left upper quadrant pain: Secondary | ICD-10-CM | POA: Diagnosis not present

## 2020-11-02 DIAGNOSIS — L539 Erythematous condition, unspecified: Secondary | ICD-10-CM | POA: Diagnosis not present

## 2020-11-02 DIAGNOSIS — R5383 Other fatigue: Secondary | ICD-10-CM | POA: Diagnosis not present

## 2020-11-02 LAB — PROTIME-INR
INR: 1 (ref 0.8–1.2)
Prothrombin Time: 13 seconds (ref 11.4–15.2)

## 2020-11-02 LAB — COMPREHENSIVE METABOLIC PANEL
ALT: 76 U/L — ABNORMAL HIGH (ref 0–44)
AST: 40 U/L (ref 15–41)
Albumin: 4 g/dL (ref 3.5–5.0)
Alkaline Phosphatase: 53 U/L (ref 38–126)
Anion gap: 7 (ref 5–15)
BUN: 8 mg/dL (ref 6–20)
CO2: 28 mmol/L (ref 22–32)
Calcium: 8.9 mg/dL (ref 8.9–10.3)
Chloride: 102 mmol/L (ref 98–111)
Creatinine, Ser: 0.57 mg/dL (ref 0.44–1.00)
GFR, Estimated: >60 mL/min (ref >60)
Glucose, Bld: 107 mg/dL — ABNORMAL HIGH (ref 70–99)
Potassium: 3.9 mmol/L (ref 3.5–5.1)
Sodium: 137 mmol/L (ref 135–145)
Total Bilirubin: 0.4 mg/dL (ref 0.3–1.2)
Total Protein: 7.3 g/dL (ref 6.5–8.1)

## 2020-11-02 LAB — URINALYSIS, ROUTINE W REFLEX MICROSCOPIC
RBC / HPF: >50 RBC/hpf — ABNORMAL HIGH (ref 0–5)
WBC, UA: >50 WBC/hpf — ABNORMAL HIGH (ref 0–5)

## 2020-11-02 LAB — CBC WITH DIFFERENTIAL/PLATELET
Abs Immature Granulocytes: 0.06 10*3/uL (ref 0.00–0.07)
Basophils Absolute: 0 10*3/uL (ref 0.0–0.1)
Basophils Relative: 0 %
Eosinophils Absolute: 0.1 10*3/uL (ref 0.0–0.5)
Eosinophils Relative: 1 %
HCT: 38.1 % (ref 36.0–46.0)
Hemoglobin: 13 g/dL (ref 12.0–15.0)
Immature Granulocytes: 1 %
Lymphocytes Relative: 33 %
Lymphs Abs: 3.5 10*3/uL (ref 0.7–4.0)
MCH: 30.4 pg (ref 26.0–34.0)
MCHC: 34.1 g/dL (ref 30.0–36.0)
MCV: 89 fL (ref 80.0–100.0)
Monocytes Absolute: 0.6 10*3/uL (ref 0.1–1.0)
Monocytes Relative: 6 %
Neutro Abs: 6.2 10*3/uL (ref 1.7–7.7)
Neutrophils Relative %: 59 %
Platelets: 256 10*3/uL (ref 150–400)
RBC: 4.28 MIL/uL (ref 3.87–5.11)
RDW: 12.6 % (ref 11.5–15.5)
WBC: 10.5 10*3/uL (ref 4.0–10.5)
nRBC: 0 % (ref 0.0–0.2)

## 2020-11-02 LAB — PREGNANCY, URINE: Preg Test, Ur: NEGATIVE

## 2020-11-02 NOTE — ED Triage Notes (Signed)
Bloody urine today and sore throat that has been ongoing.

## 2020-11-02 NOTE — ED Provider Notes (Signed)
[facility] EMERGENCY DEPT Provider Note   CSN: [id number] Arrival date & time: 11/02/20  1937     History Chief Complaint  Patient presents with  . Hematuria  . Sore Throat    Mariah Young is a 25 y.o. female.  HPI   Patient presents with hematuria.  Has known mononucleosis.  Continued sore throat.  States has had little oral intake.  States she was on steroids.  Has been on pain medicine for her throat.  States that he thought that maybe the blood was initially coming from her menses but now has blood in the urine.  That began today.  States she feels that if there is more bleeding when she brushes her teeth.  Not easily bruising.  States she now some sores in her mouth.  Does feel fatigued.  States she did have previous history of kidney stone but states that does not feel like this.  Now patient is also having some left upper quadrant abdominal pain.    Past Medical History:  Diagnosis Date  . ADHD (attention deficit hyperactivity disorder)   . Anxiety   . Diaphoresis   . Dysmenorrhea   . IUD (intrauterine device) in place   . Obesity (BMI 30.0-34.9)   . Obsessive compulsive disorder     Patient Active Problem List   Diagnosis Date Noted  . Generalized anxiety disorder 06/14/2019  . Obsessive compulsive disorder 06/14/2019  . Panic disorder 06/14/2019  . Attention deficit hyperactivity disorder (ADHD), combined type 12/07/2018  . Anxiety 12/07/2018  . Acute non-recurrent maxillary sinusitis 07/12/2017  . Cough 07/12/2017    Past Surgical History:  Procedure Laterality Date  . BREAST LUMPECTOMY Right 2014  . BREAST SURGERY    . REDUCTION MAMMAPLASTY Bilateral 2013     OB History   No obstetric history on file.     Family History  Problem Relation Age of Onset  . Hypertension Mother   . Hypertension Father   . Colon cancer Maternal Grandfather   . Lung cancer Paternal Grandfather     Social History   Tobacco Use  . Smoking status:  Never Smoker  . Smokeless tobacco: Never Used  Vaping Use  . Vaping Use: Never used  Substance Use Topics  . Alcohol use: Yes  . Drug use: Never    Home Medications Prior to Admission medications   Medication Sig Start Date End Date Taking? Authorizing Provider  ALPRAZolam Prudy Feeler) 1 MG tablet Take 1 tablet (1 mg total) by mouth 2 (two) times daily as needed for anxiety. 07/31/20  Yes Mozingo, Thereasa Solo, NP  amphetamine-dextroamphetamine (ADDERALL XR) 25 MG 24 hr capsule Take 2 capsules by mouth daily after breakfast. 08/28/20   Mozingo, Thereasa Solo, NP  amphetamine-dextroamphetamine (ADDERALL) 20 MG tablet Take 1 tablet (20 mg total) by mouth daily before supper. 07/31/20 08/30/20  Mozingo, Thereasa Solo, NP  amphetamine-dextroamphetamine (ADDERALL) 20 MG tablet Take 1 tablet (20 mg total) by mouth daily before supper. 08/28/20   Mozingo, Thereasa Solo, NP  cetirizine (ZYRTEC ALLERGY) 10 MG tablet Take 1 tablet (10 mg total) by mouth daily. 08/10/20   Hall-Potvin, [place], PA-C  desvenlafaxine (PRISTIQ) 50 MG 24 hr tablet Take 1 tablet (50 mg total) by mouth daily. 06/06/20   Mozingo, Thereasa Solo, NP  Desvenlafaxine Succinate ER (PRISTIQ) 25 MG TB24 Take 25 mg by mouth daily. 05/08/20   Mozingo, Thereasa Solo, NP  fluticasone (FLONASE) 50 MCG/ACT nasal spray Place 1 spray into both nostrils daily. 08/10/20  Hall-Potvin, [place], PA-C  Multiple Vitamin (MULTI-VITAMIN) tablet Take by mouth.    [provider]  sucralfate (CARAFATE) 1 GM/10ML suspension Take 10 mLs (1 g total) by mouth 4 (four) times daily -  with meals and at bedtime. 08/10/20   Hall-Potvin, [place], PA-C    Allergies    Clarithromycin and Penicillins  Review of Systems   Review of Systems  Constitutional: Positive for appetite change and fatigue. Negative for fever.  HENT: Positive for sore throat and trouble swallowing.   Respiratory: Negative for shortness of breath.   Gastrointestinal: Negative  for abdominal pain.  Genitourinary: Positive for hematuria. Negative for flank pain.  Musculoskeletal: Negative for back pain.  Skin: Negative for rash.  Neurological: Negative for weakness.  Hematological: Does not bruise/bleed easily.  Psychiatric/Behavioral: Negative for confusion.    Physical Exam Updated Vital Signs BP (!) 125/95 (BP Location: Right Arm)   Pulse 72   Temp 98.5 F (36.9 C) (Oral)   Resp 18   Ht 5\' 6"  (1.676 m)   Wt 79.4 kg   LMP 10/27/2020   SpO2 99%   BMI 28.25 kg/m   Physical Exam Vitals and nursing note reviewed.  HENT:     Head: Normocephalic.     Mouth/Throat:     Comments: Bilateral tonsils swollen.  Erythema.  Uvula midline.  There are couple small vesicles along the tongue. Cardiovascular:     Rate and Rhythm: Normal rate and regular rhythm.  Pulmonary:     Breath sounds: No wheezing or rhonchi.  Abdominal:     Comments: Left upper quadrant tenderness without rebound or guarding.  Skin:    General: Skin is warm.     Capillary Refill: Capillary refill takes less than 2 seconds.  Neurological:     Mental Status: She is alert and oriented to person, place, and time.     ED Results / Procedures / Treatments   Labs (all labs ordered are listed, but only abnormal results are displayed) Labs Reviewed  COMPREHENSIVE METABOLIC PANEL - Abnormal; Notable for the following components:      Result Value   Glucose, Bld 107 (*)    ALT 76 (*)    All other components within normal limits  URINALYSIS, ROUTINE W REFLEX MICROSCOPIC - Abnormal; Notable for the following components:   Color, Urine RED (*)    APPearance TURBID (*)    Glucose, UA   (*)    Value: TEST NOT REPORTED DUE TO COLOR INTERFERENCE OF URINE PIGMENT   Hgb urine dipstick   (*)    Value: TEST NOT REPORTED DUE TO COLOR INTERFERENCE OF URINE PIGMENT   Bilirubin Urine   (*)    Value: TEST NOT REPORTED DUE TO COLOR INTERFERENCE OF URINE PIGMENT   Ketones, ur   (*)    Value: TEST NOT  REPORTED DUE TO COLOR INTERFERENCE OF URINE PIGMENT   Protein, ur   (*)    Value: TEST NOT REPORTED DUE TO COLOR INTERFERENCE OF URINE PIGMENT   Nitrite   (*)    Value: TEST NOT REPORTED DUE TO COLOR INTERFERENCE OF URINE PIGMENT   Leukocytes,Ua   (*)    Value: TEST NOT REPORTED DUE TO COLOR INTERFERENCE OF URINE PIGMENT   RBC / HPF >50 (*)    WBC, UA >50 (*)    Bacteria, UA MANY (*)    Non Squamous Epithelial 0-5 (*)    All other components within normal limits  URINE CULTURE  CBC  WITH DIFFERENTIAL/PLATELET  PROTIME-INR  PREGNANCY, URINE    EKG None  Radiology US Abdomen Complete  Result Date: 11/02/2020 CLINICAL DATA:  Acute left upper quadrant abdominal pain. EXAM: ABDOMEN ULTRASOUND COMPLETE COMPARISON:  None. FINDINGS: Gallbladder: No gallstones or wall thickening visualized. No sonographic Murphy sign noted by sonographer. Common bile duct: Diameter: 2 mm which is within normal limits. Liver: No focal lesion identified. Within normal limits in parenchymal echogenicity. Portal vein is patent on color Doppler imaging with normal direction of blood flow towards the liver. IVC: No abnormality visualized. Pancreas: Visualized portion unremarkable. Spleen: Size and appearance within normal limits. Right Kidney: Length: 11.4 cm. Echogenicity within normal limits. No mass or hydronephrosis visualized. Left Kidney: Length: 11.8 cm. Echogenicity within normal limits. No mass or hydronephrosis visualized. Abdominal aorta: No aneurysm visualized. Other findings: None. IMPRESSION: No definite abnormality seen in the abdomen. Electronically Signed   By: Lupita Raider M.D.   On: 11/02/2020 09:20    Procedures Procedures   Medications Ordered in ED Medications - No data to display  ED Course  I have reviewed the triage vital signs and the nursing notes.  Pertinent labs & imaging results that were available during my care of the patient were reviewed by me and considered in my medical  decision making (see chart for details).    MDM Rules/Calculators/A&P                          Patient with recent diagnosis of mononucleosis a couple weeks ago.  Now began to have hematuria.  Good kidney function on lab work.  Urine showed hematuria but somewhat difficult to interpret due to the amount interference.  Urine culture sent.  I am not going to empirically treat as infection but if the culture comes back as positive I would treat it at that point. Reviewing some literature it looks that Epstein-Barr virus/mononucleosis can sometimes cause some renal issues through a toxicity associated with the disease.  Kidney function is good today but if the hematuria just started it would not Nestl show up in the kidney function yet.  I think patient would probably benefit from a recheck in a couple days if symptoms are continuing.  We will have patient follow-up with urology but probably the most expeditious way to get kidney function rechecked as return to the ER since she does not have a primary care doctor.  Follow-up instructions given and some of the warning signs to watch out for such as swelling lightheadedness dizziness or fevers.  Will discharge home. Ultrasound been done and shows normal size of the kidneys.  Also has normal size spleen.  Splenic sequestration of platelets unlikely as a cause of the hematuria with normal platelets and normal size spleen. Final Clinical Impression(s) / ED Diagnoses Final diagnoses:  Hematuria, unspecified type    Rx / DC Orders ED Discharge Orders    None       Benjiman Core, MD 11/02/20 1010

## 2020-11-02 NOTE — ED Notes (Signed)
ED Provider at bedside. 

## 2020-11-02 NOTE — ED Notes (Signed)
Pt discharged home after verbalizing understanding of discharge instructions; nad noted. 

## 2020-11-02 NOTE — Discharge Instructions (Addendum)
Watch for lightheadedness dizziness.  Watch for difficulty urinating.  Watch for swelling.  Your kidney function should probably be rechecked in a few days.  Watch for fevers.  Follow-up with urology for the bleeding.  Urine culture has been sent and you will be notified if it shows infection.

## 2020-11-04 LAB — URINE CULTURE: Culture: >=100000 — AB

## 2020-11-05 ENCOUNTER — Telehealth: Payer: Self-pay | Admitting: Emergency Medicine

## 2020-11-05 NOTE — Progress Notes (Signed)
ED Antimicrobial Stewardship Positive Culture Follow Up   Mariah Young is an 25 y.o. female who presented to [facility] on 11/02/2020 with a chief complaint of  Chief Complaint  Patient presents with  . Hematuria  . Sore Throat    Recent Results (from the past 720 hour(s))  Culture, group A strep     Status: None   Collection Time: 10/24/20  4:42 PM   Specimen: Throat  Result Value Ref Range Status   Specimen Description THROAT  Final   Special Requests NONE  Final   Culture   Final    NO GROUP A STREP (S.PYOGENES) ISOLATED Performed at [facility], [address] [place], [place] [address]    Report Status 10/27/2020 FINAL  Final  Urine culture     Status: Abnormal   Collection Time: 11/02/20  8:28 AM   Specimen: Urine, Random  Result Value Ref Range Status   Specimen Description   Final    URINE, RANDOM Performed at [facility]    Special Requests NONE Performed at [facility]   Final   Culture >=100,000 COLONIES/mL ESCHERICHIA COLI (A)  Final   Report Status 11/04/2020 FINAL  Final   Organism ID, Bacteria ESCHERICHIA COLI (A)  Final      Susceptibility   Escherichia coli - MIC*    AMPICILLIN >=32 RESISTANT Resistant     CEFAZOLIN 8 SENSITIVE Sensitive     CEFEPIME <=0.12 SENSITIVE Sensitive     CEFTRIAXONE <=0.25 SENSITIVE Sensitive     CIPROFLOXACIN 0.5 SENSITIVE Sensitive     GENTAMICIN <=1 SENSITIVE Sensitive     IMIPENEM <=0.25 SENSITIVE Sensitive     NITROFURANTOIN <=16 SENSITIVE Sensitive     TRIMETH/SULFA >=320 RESISTANT Resistant     AMPICILLIN/SULBACTAM >=32 RESISTANT Resistant     PIP/TAZO 16 SENSITIVE Sensitive     * >=100,000 COLONIES/mL ESCHERICHIA COLI    [x]  Patient discharged originally without antimicrobial agent and treatment is now indicated  New antibiotic prescription: Keflex 500 mg TID x 7d  ED Provider: , PA-C   Farrel Gordon 11/05/2020, 10:40 AM Clinical  Pharmacist Monday - Friday phone -  [phone] Saturday - Sunday phone - [phone]

## 2020-11-05 NOTE — Telephone Encounter (Signed)
Post ED Visit - Positive Culture Follow-up: Unsuccessful Patient Follow-up  Culture assessed and recommendations reviewed by:  []  , Pharm.D. []  Enzo Bi, Pharm.D., BCPS AQ-ID []  , Pharm.D., BCPS []  Celedonio Miyamoto, Pharm.D., BCPS []  [place], Garvin Fila.D., BCPS, AAHIVP []  , Pharm.D., BCPS, AAHIVP [x]  Georgina Pillion, PharmD []  , PharmD, BCPS  Positive urine culture  [x]  Patient discharged without antimicrobial prescription and treatment is now indicated []  Organism is resistant to prescribed ED discharge antimicrobial []  Patient with positive blood cultures   Unable to contact patient by phone, letter will be sent to address on file  Plan: Keflex 500 mg TID x seven days. Shalyn Patel PA  [place] C Azhar Yogi 11/05/2020, 4:35 PM

## 2020-11-08 ENCOUNTER — Ambulatory Visit (INDEPENDENT_AMBULATORY_CARE_PROVIDER_SITE_OTHER): Payer: 59 | Admitting: Psychiatry

## 2020-11-08 ENCOUNTER — Other Ambulatory Visit: Payer: Self-pay

## 2020-11-08 DIAGNOSIS — F411 Generalized anxiety disorder: Secondary | ICD-10-CM | POA: Diagnosis not present

## 2020-11-08 NOTE — Progress Notes (Signed)
[facility] Counselor/Therapist Progress Note  Patient ID: Mariah Young, MRN: [id number],    Date: 11/08/2020  Time Spent: 60 minutes   4:40pm to 5:40pm  Treatment Type: Individual Therapy  Reported Symptoms: anxiety, "feeling down to having had mono recently", some obsessive thoughts  Mental Status Exam:  Appearance:   Casual     Behavior:  Appropriate, Sharing and Motivated  Motor:  Normal  Speech/Language:   Clear and Coherent  Affect:  anxious  Mood:  anxious  Thought process:  goal directed  Thought content:    some obsessiveness  Sensory/Perceptual disturbances:    WNL  Orientation:  oriented to person, place, time/date, situation, day of week, month of year and year  Attention:  Good  Concentration:  Good and Fair  Memory:  WNL  Fund of knowledge:   Good  Insight:    Good and Fair  Judgment:   Good  Impulse Control:  Good   Risk Assessment: Danger to Self:  No Self-injurious Behavior: No Danger to Others: No Duty to Warn:no Physical Aggression / Violence:No  Access to Firearms a concern: No  Gang Involvement:No   Subjective: Patient today reports symptoms of anxiety, some obsessive thoughts, sad re: friend diagnosed with CA, and    Interventions: Solution-Oriented/Positive Psychology and Ego-Supportive  Diagnosis:   ICD-10-CM   1. Generalized anxiety disorder  F41.1      Plan of Care: Patient not signing tx plan on computer screen due to Covid.  Treatment Goals: Goals will remain on tx plan as patient works with strategies to achieve her goals. Progress will be noted each session and documented in "Progress" section of goal plan.   Long term goal:(Measurable) 1.Stabilize anxiety level while increasing ability to function on a daily basis. 2. Patient will eventually progress to where she rates her anxiety as a "3" or less on "1-10 Anxiety scale" for at least 2 months.  Short term goal: Increase understanding of beliefs and messages  that produce anxiety, worry,fear, and negativity.  Strategies: Identify, challenge, and replace anxious/fearful/negative with positive, hopeful, and empoweriing self-talk.  Progress: Patient in today reporting anxiety, sadness, and tired/"down" due to having mono recently.  Anxiety, some obsessive thoughts, and sadness related to personal, work, and close friend's health issues. Work very stressful and "has worsened some" due to longer hours and having to commute out of town, but may be changing my job responsibilities. Processed a lot of her anxious thoughts in session today, along with her sadness re: coworker friend's recent CA diagnosis and upcoming surgery. Assuming the worst in situations.  Followed up from prior session with more work based on her long and short term goals and strategy in tx goal plan above, in better managing the anxious/negative/depressive thoughts as she works on interrupting them and replacing with more reality-based and empowering thoughts that do not support her anxiety. Patient to continues working with this strategy between sessions.  Encouraged patient her keep in contact with people who are supportive of her, stay in the present focusing on what she can control, practice positive self-care and self-talk, letting go of negative assumptions, and to feel good about the strength she is showing while she works to develop healthier behaviors and move forward in a more positive direction.   Goal review and progress/challenges noted with patient.  Next appointment within 2 to 3 weeks.   Mathis Fare, LCSW

## 2020-11-14 ENCOUNTER — Other Ambulatory Visit (HOSPITAL_BASED_OUTPATIENT_CLINIC_OR_DEPARTMENT_OTHER)
Admission: RE | Admit: 2020-11-14 | Discharge: 2020-11-14 | Disposition: A | Discharge status: Home / Self Care | Payer: 59 | Source: Ambulatory Visit | Attending: Nurse Practitioner | Admitting: Nurse Practitioner

## 2020-11-14 ENCOUNTER — Encounter (HOSPITAL_BASED_OUTPATIENT_CLINIC_OR_DEPARTMENT_OTHER): Payer: Self-pay | Admitting: Nurse Practitioner

## 2020-11-14 ENCOUNTER — Ambulatory Visit (INDEPENDENT_AMBULATORY_CARE_PROVIDER_SITE_OTHER): Payer: 59 | Admitting: Nurse Practitioner

## 2020-11-14 ENCOUNTER — Other Ambulatory Visit: Payer: Self-pay

## 2020-11-14 VITALS — BP 120/80 | HR 84 | Ht 66.0 in | Wt 171.6 lb

## 2020-11-14 DIAGNOSIS — Z7689 Persons encountering health services in other specified circumstances: Secondary | ICD-10-CM | POA: Diagnosis not present

## 2020-11-14 DIAGNOSIS — B2790 Infectious mononucleosis, unspecified without complication: Secondary | ICD-10-CM | POA: Diagnosis not present

## 2020-11-14 DIAGNOSIS — Z0000 Encounter for general adult medical examination without abnormal findings: Secondary | ICD-10-CM | POA: Insufficient documentation

## 2020-11-14 DIAGNOSIS — R55 Syncope and collapse: Secondary | ICD-10-CM

## 2020-11-14 DIAGNOSIS — R319 Hematuria, unspecified: Secondary | ICD-10-CM

## 2020-11-14 DIAGNOSIS — N390 Urinary tract infection, site not specified: Secondary | ICD-10-CM

## 2020-11-14 DIAGNOSIS — B9620 Unspecified Escherichia coli [E. coli] as the cause of diseases classified elsewhere: Secondary | ICD-10-CM

## 2020-11-14 LAB — COMPREHENSIVE METABOLIC PANEL
ALT: 19 U/L (ref 0–44)
AST: 16 U/L (ref 15–41)
Albumin: 4.4 g/dL (ref 3.5–5.0)
Alkaline Phosphatase: 44 U/L (ref 38–126)
Anion gap: 9 (ref 5–15)
BUN: 14 mg/dL (ref 6–20)
CO2: 27 mmol/L (ref 22–32)
Calcium: 9.3 mg/dL (ref 8.9–10.3)
Chloride: 101 mmol/L (ref 98–111)
Creatinine, Ser: 0.5 mg/dL (ref 0.44–1.00)
GFR, Estimated: >60 mL/min (ref >60)
Glucose, Bld: 95 mg/dL (ref 70–99)
Potassium: 3.8 mmol/L (ref 3.5–5.1)
Sodium: 137 mmol/L (ref 135–145)
Total Bilirubin: 0.5 mg/dL (ref 0.3–1.2)
Total Protein: 7.4 g/dL (ref 6.5–8.1)

## 2020-11-14 MED ORDER — FLUCONAZOLE 150 MG PO TABS: ORAL_TABLET | ORAL | 0 refills | Status: DC

## 2020-11-14 MED ORDER — CEFTRIAXONE SODIUM 1 G IJ SOLR
1.0000 g | Freq: Once | INTRAMUSCULAR | Status: AC
  Administered 2020-11-14: 1 g via INTRAMUSCULAR

## 2020-11-14 MED ORDER — NITROFURANTOIN MONOHYD MACRO 100 MG PO CAPS: 100.0000 mg | ORAL_CAPSULE | Freq: Two times a day (BID) | ORAL | 0 refills | Status: DC

## 2020-11-14 NOTE — Progress Notes (Signed)
New Patient Office Visit  Subjective:  Patient ID: Mariah Young, female    DOB: 29-Apr-1996  Age: 25 y.o. MRN: [id number]  CC:  Chief Complaint  Patient presents with  . Establish Care    HPI Mariah Young is a 25 year old female that presents today to establish care.  She has not had routine primary care in quite some time. Her last CPE was: "several years ago" She also sees: Dortha Schwalbe with behavioral health and Louie Bun for counseling.  [facility] for gynecologic care  Concerns today: Mono UTI  She was seen in the ED on the 4/5 for influenza like symptoms with fever, sore throat, and cough and was subsequently found to have mononucleosis. She was treated with IV fluids for dehydration and released. On 4/8 she returned to the ED for swelling in her throat as a result of the mono and a CT of the neck was performed. At that time she was treated with steroids, IV fluids, and released with precautions. She tells me since the last visit her symptoms have improved. She did have to take 2.5 weeks off of work, but she has since returned. She no longer has significant swelling in her throat and her fevers have resolved. She is not as fatigued as she was initially. She endorses that overall she feels better but over the weekend she did have an episode of nausea and lightheadedness that came over her in a wave, but resolved. She would like to know when it would be safe for her to resume close contact with her boyfriend. He did have a brief stent of very mild symptoms, but these have completely resolved for him. She would also like to know if it is safe for her to attend her sisters baby shower on the 15th of May.   She was recently seen in the ED for hematuria thought to be related to recent infection with mononucleosis. Kidney function was good at that time with no ultrasound evidence of abnormalities in the kidneys or spleen. They recommended a recheck in the next few days to  ensure that toxicity associated with mononucleosis was not evident. Culture results received after the visit showed the presence of E.coli. Keflex 500mg  TID x7 days was sent to pharmacy for patient, however, the patient was unaware that the medication had been sent. She reports that the hematuria has resolved, but she is still experiencing symptoms of bladder spasms intermittently.   She denies fever, chills, abdominal pain, back pain, hematuria, dysuria, decreased urinary output, bowel dysfunction, difficultly breathing or swallowing.  Narrative: Mariah Young is a 25 year old female who works as a 22 in [employer] area. She has plans to attend medical school, but is taking some time off to work. She has a history of anxiety disorder, depression, OCD, and ADHD for which she is treated by psychiatry and counseling. She reports her symptoms are well managed at this time for her. She denies concerns with SI/HI or self harm.   She does not use nicotine or recreational drugs. She does endorse very minimal alcohol use with less than one drink per week. She is currently sexually active with one female partner and they use condoms and she has an IUD. She denies any concerns for STI and declines testing today.   She reports her periods are unpredictable on the IUD, but not heavy in flow or long in cycle. She is seen by [facility] and recently had her IUD  replaced due to the previous IUD starting to perforate the uterine wall. She denies any complications, pains, or concerns today.   She endorses a family history of HTN in her mother and father and colon cancer in her maternal grandfather.  She has undergone breast reduction surgery and a lumpectomy of the breast in the past. Her last mammogram was 2014.  Her last pap was January 2022 with [facility].  Her last flu vaccine was fall 2021 and her last tetanus booster was in 2019.   Past Medical History:  Diagnosis Date  . Acute  non-recurrent maxillary sinusitis 07/12/2017  . ADHD (attention deficit hyperactivity disorder)   . Anxiety   . Cough 07/12/2017  . Diaphoresis   . Dysmenorrhea   . IUD (intrauterine device) in place   . Obesity (BMI 30.0-34.9)   . Obsessive compulsive disorder    ROS Review of Systems All review of systems negative except what is listed in the HPI  Objective:   Today's Vitals: BP 120/80   Pulse 84   Ht 5\' 6"  (1.676 m)   Wt 171 lb 9.6 oz (77.8 kg)   LMP 10/27/2020   SpO2 100%   BMI 27.70 kg/m   Physical Exam Vitals and nursing note reviewed.  Constitutional:      Appearance: Normal appearance. She is normal weight.  HENT:     Head: Normocephalic and atraumatic.     Right Ear: Tympanic membrane, ear canal and external ear normal.     Left Ear: Tympanic membrane, ear canal and external ear normal.     Nose: Congestion present.     Mouth/Throat:     Mouth: Mucous membranes are moist.     Pharynx: Uvula midline. Pharyngeal swelling and posterior oropharyngeal erythema present.     Tonsils: No tonsillar exudate or tonsillar abscesses. 3+ on the right. 3+ on the left.     Comments: Tonsils moderately enlarged with no deviation of uvula present. No exudate or stones visualized. No evidence of airway restriction. Eyes:     Extraocular Movements: Extraocular movements intact.     Conjunctiva/sclera: Conjunctivae normal.     Pupils: Pupils are equal, round, and reactive to light.  Neck:     Vascular: No carotid bruit.  Cardiovascular:     Rate and Rhythm: Normal rate and regular rhythm.     Pulses: Normal pulses.     Heart sounds: Normal heart sounds. No murmur heard.   Pulmonary:     Effort: Pulmonary effort is normal.     Breath sounds: Normal breath sounds. No wheezing.  Chest:  Breasts:     Right: No supraclavicular adenopathy.     Left: No supraclavicular adenopathy.    Abdominal:     General: Abdomen is flat. Bowel sounds are normal. There is no distension.      Palpations: Abdomen is soft.     Tenderness: There is no abdominal tenderness. There is no right CVA tenderness, left CVA tenderness, guarding or rebound.     Comments: No splenic enlargement or tenderness detected on palpation  Musculoskeletal:        General: Normal range of motion.     Cervical back: Normal range of motion and neck supple. No rigidity or tenderness.     Right lower leg: No edema.     Left lower leg: No edema.  Lymphadenopathy:     Head:     Right side of head: Tonsillar adenopathy present. No submental, submandibular, preauricular, posterior auricular  or occipital adenopathy.     Left side of head: Tonsillar adenopathy present. No submental, submandibular, preauricular, posterior auricular or occipital adenopathy.     Cervical: Cervical adenopathy present.     Right cervical: Superficial cervical adenopathy present. No deep or posterior cervical adenopathy.    Left cervical: Superficial cervical adenopathy present. No deep or posterior cervical adenopathy.     Upper Body:     Right upper body: No supraclavicular adenopathy.     Left upper body: No supraclavicular adenopathy.     Comments: Mild cervical adenopathy palpated in the tonsillar and superficial cervical nodes with no tenderness. Nodes rubbery, smooth, and firm.   Skin:    General: Skin is warm and dry.     Capillary Refill: Capillary refill takes less than 2 seconds.  Neurological:     General: No focal deficit present.     Mental Status: She is alert and oriented to person, place, and time.     Cranial Nerves: No cranial nerve deficit.     Sensory: No sensory deficit.     Motor: No weakness.     Coordination: Coordination normal.     Gait: Gait normal.  Psychiatric:        Mood and Affect: Mood normal.        Behavior: Behavior normal.        Thought Content: Thought content normal.        Judgment: Judgment normal.     Assessment & Plan:   Problem List Items Addressed This Visit       Genitourinary   E-coli UTI    E coli UTI present on urine culture with recent ED visit. Unfortunately, she was not aware that treatment had been sent in for her. Symptoms somewhat improved, but given bladder spasms and recent dizziness and nausea will begin treatment today with 1g IM Rocephin and 7 days of nitrofurantoin for complicated coverage. Patient does have PCN allergy listed, but reports that this was in childhood with a rash and she has tolerated cephalosporins since that time.  Recommend increased hydration and rest. Will send diflucan for yeast treatment that often accompanies antibiotic use in patient.  Follow-up if symptoms worsen or fail to improve.       Relevant Medications   nitrofurantoin, macrocrystal-monohydrate, (MACROBID) 100 MG capsule   fluconazole (DIFLUCAN) 150 MG tablet     Other   Encounter to establish care - Primary   Hematuria    Gross hematuria has resolved. Recommend increased monitoring and notification if symptoms present again. Will monitor repeat kidney function today to ensure that no alterations in kidney function have presented since last check.  Will make changes to plan of care as necessary based on results.       Relevant Orders   Comprehensive metabolic panel (Completed)   Infectious mononucleosis without complication    Symptoms improving with improved fatigue and sore throat with no fevers or chills for several days. No signs of splenic pain or inflammation present. Possible kidney involvement vs. UTI with no previous signs of kidney damage. Will recheck labs today.  Cervical nodes still palpable, but no concerning findings. Tonsils remain enlarged, but reportedly improved per patient.  It seems that her symptoms are resolving and she is recovering well from her infection. Discussed guideline recommendations for avoidance of close contact until symptom resolution to avoid spread. It does appear that her boyfriend had mild symptoms, but those have  also resolved. Discussed that as long as symptoms  have resolved she may resume normal interaction with her boyfriend. Out of abundance of precaution, recommend that she wear a mask at the baby shower. At that point, her symptoms will be >6weeks out and unless new symptoms develop then she should be fine to join in the festivities with her family.  Follow-up if symptoms worsen or return.       Relevant Medications   nitrofurantoin, macrocrystal-monohydrate, (MACROBID) 100 MG capsule   fluconazole (DIFLUCAN) 150 MG tablet   Vasovagal syncope    Upon administration of IM injection of rocephin, patient began to feel light headed and expressed the need to lay down. The CMA reported the patient began to fall and she helped lower the patient into a laying position with her feet elevated above her head. The patient regained consciousness quickly and remained laying with feet elevated for several minutes. She was slowly helped to a seated position and then to a standing position. She endorsed feeling completely fine after a 15 minute monitoring period.  She endorsed this happening in the past with steroid injections.  She was evaluated with no cardiac or neurological deficits detected. She was able to ambulate independently without issue.  No residual concerns.  Discussed with patient the etiology of vasovagal response. Recommend in the future she have any injections performed while laying flat if possible.  She is to monitor for symptoms and report any changes to [place] if they present or seek emergency evaluation.          Outpatient Encounter Medications as of 11/14/2020  Medication Sig  . ALPRAZolam (XANAX) 1 MG tablet Take 1 tablet (1 mg total) by mouth 2 (two) times daily as needed for anxiety.  Marland Kitchen amphetamine-dextroamphetamine (ADDERALL XR) 25 MG 24 hr capsule Take 2 capsules by mouth daily after breakfast.  . amphetamine-dextroamphetamine (ADDERALL) 20 MG tablet Take 1 tablet (20 mg total) by mouth  daily before supper.  . cetirizine (ZYRTEC ALLERGY) 10 MG tablet Take 1 tablet (10 mg total) by mouth daily.  Marland Kitchen desogestrel-ethinyl estradiol (MIRCETTE) 0.15-0.02/0.01 MG (21/5) tablet   . desvenlafaxine (PRISTIQ) 50 MG 24 hr tablet Take 1 tablet (50 mg total) by mouth daily.  Marland Kitchen Desvenlafaxine Succinate ER (PRISTIQ) 25 MG TB24 Take 25 mg by mouth daily.  . fluconazole (DIFLUCAN) 150 MG tablet Take 1 tab (150mg ) by mouth on last day of antibiotic treatment. May repeat dose in 3 days if symptoms persist.  . Multiple Vitamin (ONE-DAILY MULTI-VITAMIN) TABS Take 1 tablet by mouth daily.  . nitrofurantoin, macrocrystal-monohydrate, (MACROBID) 100 MG capsule Take 1 capsule (100 mg total) by mouth 2 (two) times daily.  . [DISCONTINUED] fluticasone (FLONASE) 50 MCG/ACT nasal spray Place 1 spray into both nostrils daily.  . [DISCONTINUED] Multiple Vitamin (MULTI-VITAMIN) tablet Take by mouth.  . [DISCONTINUED] sucralfate (CARAFATE) 1 GM/10ML suspension Take 10 mLs (1 g total) by mouth 4 (four) times daily -  with meals and at bedtime.  . [DISCONTINUED] amphetamine-dextroamphetamine (ADDERALL) 20 MG tablet Take 1 tablet (20 mg total) by mouth daily before supper.  . [EXPIRED] cefTRIAXone (ROCEPHIN) injection 1 g    No facility-administered encounter medications on file as of 11/14/2020.   Time: 71 minutes, >50% spent counseling, care coordination, chart review, and documentation.    Follow-up: Return in about 3 months (around 02/13/2021) for CPE- lab appointment today.   02/15/2021, NP

## 2020-11-14 NOTE — Assessment & Plan Note (Signed)
Upon administration of IM injection of rocephin, patient began to feel light headed and expressed the need to lay down. The CMA reported the patient began to fall and she helped lower the patient into a laying position with her feet elevated above her head. The patient regained consciousness quickly and remained laying with feet elevated for several minutes. She was slowly helped to a seated position and then to a standing position. She endorsed feeling completely fine after a 15 minute monitoring period.  She endorsed this happening in the past with steroid injections.  She was evaluated with no cardiac or neurological deficits detected. She was able to ambulate independently without issue.  No residual concerns.  Discussed with patient the etiology of vasovagal response. Recommend in the future she have any injections performed while laying flat if possible.  She is to monitor for symptoms and report any changes to [place] if they present or seek emergency evaluation.

## 2020-11-14 NOTE — Assessment & Plan Note (Signed)
E coli UTI present on urine culture with recent ED visit. Unfortunately, she was not aware that treatment had been sent in for her. Symptoms somewhat improved, but given bladder spasms and recent dizziness and nausea will begin treatment today with 1g IM Rocephin and 7 days of nitrofurantoin for complicated coverage. Patient does have PCN allergy listed, but reports that this was in childhood with a rash and she has tolerated cephalosporins since that time.  Recommend increased hydration and rest. Will send diflucan for yeast treatment that often accompanies antibiotic use in patient.  Follow-up if symptoms worsen or fail to improve.

## 2020-11-14 NOTE — Patient Instructions (Addendum)
Recommendations from today's visit:  Kidney Recheck  I would like to recheck your kidney function today with labs to ensure that everything looks ok based on your recent visit to the emergency room. I will order the labs today and you can make the appointment at [facility] to have this completed.   Our lab is located on the ground floor of this building. As you exit the elevator you will see a glass room on the left, enter here for the lab and imaging department.  Physical Exam  We will plan for a complete physical exam with routine labs in the next couple of months. You can schedule that as you check-out today at a time that is convenient for you.   Mono  The guidelines for mono state that the virus can reactivate in your system at virtually any time, but typically only remains active for 4-6 weeks. The recommendations are to avoid close contact until your symptoms have resolved including: no longer have a sore throat, no longer have a fever, and your symptoms are resolving.   As for your sister, you should be fine to be around her on the 15th. To be extra safe, I recommend that you wear your mask and do not share food or drinks with others, but overall there should be no risks.   ________________________________________________________________ Thank you for choosing [facility] [facility] for your Primary Care needs. I am excited for the opportunity to partner with you to meet your health care goals. It was a pleasure meeting you today!  I am an Adult-Geriatric Nurse Practitioner with a background in caring for patients for more than 20 years. I received my Paediatric nurse in Nursing and my Doctor of Nursing Practice degrees at [employer]. I received additional fellowship training in primary care and sports medicine after receiving my doctorate degree. I provide primary care and sports medicine services to patients age 15 and older within this office. I am also a provider  with the [facility] and the director of the APP Fellowship with [facility].  I am a [place] native, but have called the [place] area home for nearly 20 years and am proud to be a member of this community.   I am passionate about providing the best service to you through preventive medicine and supportive care. I consider you a part of the medical team and value your input. I work diligently to ensure that you are heard and your needs are met in a safe and effective manner. I want you to feel comfortable with me as your provider and want you to know that your health concerns are important to me.   For your information, our office hours are Monday- Friday 8:00 AM - 5:00 PM At this time I am not in the office on Wednesdays.  If you have questions or concerns, please call our office at [phone] or send [place] a MyChart message and we will respond as quickly as possible.   For all urgent or time sensitive needs we ask that you please call the office to avoid delays. MyChart is not constantly monitored and replies may take up to 72 business hours.  MyChart Policy: . MyChart allows for you to see your visit notes, after visit summary, provider recommendations, lab and tests results, make an appointment, request refills, and contact your provider or the office for non-urgent questions or concerns.  . Providers are seeing patients during normal business  hours and do not have built in time to review MyChart messages. We ask that you allow a minimum of 72 business hours for MyChart message responses.  . Complex MyChart concerns may require a visit. Your provider may request you schedule a virtual or in person visit to ensure we are providing the best care possible. . MyChart messages sent after 4:00 PM on Friday will not be received by the provider until Monday morning.    Lab and Test Results: . You will receive your lab and test results on MyChart as  soon as they are completed and results have been sent by the lab or testing facility. Due to this service, you will receive your results BEFORE your provider.  . Please allow a minimum of 72 business hours for your provider to receive and review lab and test results and contact you about.   . Most lab and test result comments from the provider will be sent through [facility]. Your provider may recommend changes to the plan of care, follow-up visits, repeat testing, ask questions, or request an office visit to discuss these results. You may reply directly to this message or call the office at [phone] to provide information for the provider or set up an appointment. . In some instances, you will be called with test results and recommendations. Please let us know if this is preferred and we will make note of this in your chart to provide this for you.    . If you have not heard a response to your lab or test results in 72 business hours, please call the office to let us know.   After Hours: . For all non-emergency after hours needs, please call the office at [phone] and select the option to reach the on-call provider service. On-call services are shared between multiple [facility] offices and therefore it will not be possible to speak directly with your provider. On-call providers may provide medical advice and recommendations, but are unable to provide refills for maintenance medications.  . For all emergency or urgent medical needs after normal business hours, we recommend that you seek care at the closest Urgent Care or Emergency Department to ensure appropriate treatment in a timely manner.  [facility] [place] [facility] has a 24 hour emergency room located on the ground floor for your convenience.    Please do not hesitate to reach out to [place] with concerns.   Thank you, again, for choosing me as your health care partner. I appreciate your trust and look forward to learning more about  you.   Worthy Keeler, DNP, AGNP-c ___________________________________________________________________________________

## 2020-11-14 NOTE — Assessment & Plan Note (Signed)
Symptoms improving with improved fatigue and sore throat with no fevers or chills for several days. No signs of splenic pain or inflammation present. Possible kidney involvement vs. UTI with no previous signs of kidney damage. Will recheck labs today.  Cervical nodes still palpable, but no concerning findings. Tonsils remain enlarged, but reportedly improved per patient.  It seems that her symptoms are resolving and she is recovering well from her infection. Discussed guideline recommendations for avoidance of close contact until symptom resolution to avoid spread. It does appear that her boyfriend had mild symptoms, but those have also resolved. Discussed that as long as symptoms have resolved she may resume normal interaction with her boyfriend. Out of abundance of precaution, recommend that she wear a mask at the baby shower. At that point, her symptoms will be >6weeks out and unless new symptoms develop then she should be fine to join in the festivities with her family.  Follow-up if symptoms worsen or return.

## 2020-11-14 NOTE — Assessment & Plan Note (Signed)
Gross hematuria has resolved. Recommend increased monitoring and notification if symptoms present again. Will monitor repeat kidney function today to ensure that no alterations in kidney function have presented since last check.  Will make changes to plan of care as necessary based on results.

## 2020-11-15 NOTE — Progress Notes (Signed)
Labs look great. No sign of kidney dysfunction. No changes to plan of care.

## 2020-11-16 ENCOUNTER — Telehealth (HOSPITAL_BASED_OUTPATIENT_CLINIC_OR_DEPARTMENT_OTHER): Payer: Self-pay

## 2020-11-16 NOTE — Telephone Encounter (Signed)
-----   Message from Tollie Eth, NP sent at 11/15/2020  6:42 PM EDT ----- Labs look great. No sign of kidney dysfunction. No changes to plan of care.

## 2020-11-16 NOTE — Telephone Encounter (Signed)
Results released by Sarabeth Early, AGNP and reviewed by patient via MyChart. Instructed patient to contact the office with any questions or concerns.  

## 2020-11-22 ENCOUNTER — Ambulatory Visit (INDEPENDENT_AMBULATORY_CARE_PROVIDER_SITE_OTHER): Payer: 59 | Admitting: Psychiatry

## 2020-11-22 ENCOUNTER — Ambulatory Visit (INDEPENDENT_AMBULATORY_CARE_PROVIDER_SITE_OTHER): Payer: 59 | Admitting: Adult Health

## 2020-11-22 ENCOUNTER — Other Ambulatory Visit: Payer: Self-pay

## 2020-11-22 ENCOUNTER — Encounter: Payer: Self-pay | Admitting: Adult Health

## 2020-11-22 DIAGNOSIS — F422 Mixed obsessional thoughts and acts: Secondary | ICD-10-CM | POA: Diagnosis not present

## 2020-11-22 DIAGNOSIS — F410 Panic disorder [episodic paroxysmal anxiety] without agoraphobia: Secondary | ICD-10-CM | POA: Diagnosis not present

## 2020-11-22 DIAGNOSIS — F902 Attention-deficit hyperactivity disorder, combined type: Secondary | ICD-10-CM | POA: Diagnosis not present

## 2020-11-22 DIAGNOSIS — F411 Generalized anxiety disorder: Secondary | ICD-10-CM

## 2020-11-22 MED ORDER — AMPHETAMINE-DEXTROAMPHETAMINE 20 MG PO TABS: 20.0000 mg | ORAL_TABLET | Freq: Every day | ORAL | 0 refills | Status: DC

## 2020-11-22 MED ORDER — DESVENLAFAXINE SUCCINATE ER 25 MG PO TB24: 25.0000 mg | ORAL_TABLET | Freq: Every day | ORAL | 5 refills | Status: DC

## 2020-11-22 MED ORDER — ALPRAZOLAM 1 MG PO TABS: 1.0000 mg | ORAL_TABLET | Freq: Two times a day (BID) | ORAL | 2 refills | Status: DC | PRN

## 2020-11-22 MED ORDER — DESVENLAFAXINE SUCCINATE ER 50 MG PO TB24: 50.0000 mg | ORAL_TABLET | Freq: Every day | ORAL | 5 refills | Status: DC

## 2020-11-22 MED ORDER — AMPHETAMINE-DEXTROAMPHET ER 25 MG PO CP24: ORAL_CAPSULE | ORAL | 0 refills | Status: DC

## 2020-11-22 MED ORDER — AMPHETAMINE-DEXTROAMPHET ER 25 MG PO CP24
50.0000 mg | ORAL_CAPSULE | Freq: Every day | ORAL | 0 refills | Status: DC
Start: 2020-11-22 — End: 2021-02-21

## 2020-11-22 NOTE — Progress Notes (Signed)
[facility] Counselor/Therapist Progress Note  Patient ID: Mariah Young, MRN: [id number],    Date: 11/22/2020  Time Spent: 60 minutes    5:00pm to 6:00pm   Treatment Type: Individual Therapy  Reported Symptoms: anxiety, depression "with anxiety being the stronger one" Mental Status Exam:  Appearance:   Casual     Behavior:  Appropriate, Sharing and Motivated  Motor:  Normal  Speech/Language:   Clear and Coherent  Affect:  anxiety  Mood:  anxious and depressed  Thought process:  normal  Thought content:    some obsessiveness  Sensory/Perceptual disturbances:    WNL  Orientation:  oriented to person, place, time/date, situation, day of week, month of year and year  Attention:  Fair  Concentration:  Fair  Memory:  WNL  Fund of knowledge:   Good  Insight:    Fair  Judgment:   Fair  Impulse Control:  Good and Fair   Risk Assessment: Danger to Self:  No Self-injurious Behavior: No Danger to Others: No Duty to Warn:no Physical Aggression / Violence:No  Access to Firearms a concern: No  Gang Involvement:No   Subjective: Patient today reporting anxiety and depression.  Anxiety is stronger symptom and her main concern today.  See Progress Note below.   Interventions: Solution-Oriented/Positive Psychology and Ego-Supportive  Diagnosis:   ICD-10-CM   1. Generalized anxiety disorder  F41.1      Plan of Care: Patient not signing tx plan on computer screen due to Covid.  Treatment Goals: Goals will remain on tx plan as patient works with strategies to achieve her goals. Progress will be noted each session and documented in "Progress" section of goal plan.   Long term goal:(Measurable) 1.Stabilize anxiety level while increasing ability to function on a daily basis. 2. Patient will eventually progress to where she rates her anxiety as a "3" or less on "1-10 Anxiety scale" for at least 2 months.  Short term goal: Increase understanding of beliefs and  messages that produce anxiety, worry,fear, and negativity.  Strategies: Identify, challenge, and replace anxious/fearful/negative with positive, hopeful, and empoweriing self-talk.  Progress: Patient in today reporting anxiety as her main symptom, with some depression also.  S.O. is back in [place] and patient is feeling some loneliness and missing him as their relationship has gotten closer. Also experienced recent "rejection" for job she applied for. Very tearful initially about this and then explained how she took the "job rejection" very personally and "assuming the worst case" in that she won't be able to get a new job as the current one offers little opportunity for good "work-life balance". Significant anxiety re: job situation and talked through this more at length today, and being able to eventually reach a place of better understanding how her anxious/fearful thoughts are contributing to her "assuming the worst" and in looking for what might go wrong versus right, mixed in with some self-doubt based on anxious thoughts.  Worked with her short term goal and strategy in tx plan above, which helped patient feel more grounded and less anxious/fearful.  Was noticeably less anxious towards session end and she was able to see some positives coming up this week.  She is to continue working on interrupting anxious thoughts and replacing them in between sessions, in addition to getting outside some each day, staying in contact with people who are supportive of her, remaining in the present focusing on what she can control, trying to not jump to conclusions when things do not go  as planned, practicing more consistent positive self-care and self talk, letting go of negative assumptions, and realizing the strengths she is showing in the midst of difficult circumstances to continue working on her goals and developing healthier behaviors and coping skills while moving forward in a more positive  direction.  Goal review and progress/challenges noted with patient.  Next appointment within 2-3 weeks.   Mathis Fare, LCSW

## 2020-11-22 NOTE — Progress Notes (Signed)
Mariah Young [id number] 28-Nov-1995 25 y.o.  Subjective:   Patient ID:  Mariah Young is a 25 y.o. (DOB 1996-01-01) female.  Chief Complaint: No chief complaint on file.   HPI Mariah Young presents to the office today for follow-up of ADHD, panic disorder, GAD, and obsessional thoughts.   Describes mood today as "ok". Pleasant. Tearful at times. Mood symptoms - reports decreased depression, anxiety, and irritability. Stating "I'm doing well". Feels like medications are working well. Recovering from mono - has been in and out of the hospital. Still feels tired. She and boyfriend celebrating 6 month anniversary. Planning a trip to [place] to see sister. Seeing Rockne Menghini for therapy. Stable interest and motivation. Taking medications as prescribed.  Energy levels lower - still feels fatigued. Active, does not have a regular exercise routine. Enjoys some usual interests and activities. Single. Has a boyfriend. Lives alone with dog. Family in [place]. Appetite adequate. Weight stable  - 170 pounds. Sleeping difficulties over past week. Averages 6 hours.  Focus and concentration stable. Completing tasks. Managing aspects of household. Works full-time - 45 to 55 hours. Denies SI or HI.  Denies AH or VH.  Previous medication trials: Prozac   GAD-7   Flowsheet Row Office Visit from 11/14/2020 in [facility]  Total GAD-7 Score 11    PHQ2-9   Flowsheet Row Office Visit from 11/14/2020 in [facility]  PHQ-2 Total Score 3  PHQ-9 Total Score 14    Flowsheet Row ED from 11/02/2020 in [facility] ED from 10/27/2020 in [facility] ED from 10/24/2020 in [facility]  C-SSRS RISK CATEGORY No Risk No Risk No Risk       Review of Systems:  Review of Systems  Musculoskeletal: Negative for gait problem.   Neurological: Negative for tremors.  Psychiatric/Behavioral:       Please refer to HPI    Medications: I have reviewed the patient's current medications.  Current Outpatient Medications  Medication Sig Dispense Refill  . [START ON 01/17/2021] amphetamine-dextroamphetamine (ADDERALL XR) 25 MG 24 hr capsule Take two capsules by mouth daily after breakfast. 60 capsule 0  . [START ON 12/20/2020] amphetamine-dextroamphetamine (ADDERALL XR) 25 MG 24 hr capsule Take two capsules by mouth daily after breakfast. 60 capsule 0  . [START ON 12/20/2020] amphetamine-dextroamphetamine (ADDERALL) 20 MG tablet Take 1 tablet (20 mg total) by mouth daily. 30 tablet 0  . [START ON 01/17/2021] amphetamine-dextroamphetamine (ADDERALL) 20 MG tablet Take 1 tablet (20 mg total) by mouth daily. 30 tablet 0  . ALPRAZolam (XANAX) 1 MG tablet Take 1 tablet (1 mg total) by mouth 2 (two) times daily as needed for anxiety. 60 tablet 2  . amphetamine-dextroamphetamine (ADDERALL XR) 25 MG 24 hr capsule Take 2 capsules by mouth daily after breakfast. 60 capsule 0  . amphetamine-dextroamphetamine (ADDERALL) 20 MG tablet Take 1 tablet (20 mg total) by mouth daily before supper. 30 tablet 0  . cetirizine (ZYRTEC ALLERGY) 10 MG tablet Take 1 tablet (10 mg total) by mouth daily. 30 tablet 0  . desogestrel-ethinyl estradiol (MIRCETTE) 0.15-0.02/0.01 MG (21/5) tablet     . desvenlafaxine (PRISTIQ) 50 MG 24 hr tablet Take 1 tablet (50 mg total) by mouth daily. 30 tablet 5  . Desvenlafaxine Succinate ER (PRISTIQ) 25 MG TB24 Take 25 mg by mouth daily. 30 tablet 5  . fluconazole (DIFLUCAN) 150 MG tablet Take 1 tab (150mg ) by mouth on last  day of antibiotic treatment. May repeat dose in 3 days if symptoms persist. 2 tablet 0  . Multiple Vitamin (ONE-DAILY MULTI-VITAMIN) TABS Take 1 tablet by mouth daily.    . nitrofurantoin, macrocrystal-monohydrate, (MACROBID) 100 MG capsule Take 1 capsule (100 mg total) by mouth 2 (two) times daily. 14  capsule 0   No current facility-administered medications for this visit.    Medication Side Effects: None  Allergies:  Allergies  Allergen Reactions  . Clarithromycin Other (See Comments) and Rash    Vasculitis Vasculitis   . Penicillins Other (See Comments)    Other reaction(s): Intolerance vasculitis vasculitis     Past Medical History:  Diagnosis Date  . Acute non-recurrent maxillary sinusitis 07/12/2017  . ADHD (attention deficit hyperactivity disorder)   . Anxiety   . Cough 07/12/2017  . Diaphoresis   . Dysmenorrhea   . IUD (intrauterine device) in place   . Obesity (BMI 30.0-34.9)   . Obsessive compulsive disorder     Past Medical History, Surgical history, Social history, and Family history were reviewed and updated as appropriate.   Please see review of systems for further details on the patient's review from today.   Objective:   Physical Exam:  LMP 10/27/2020   Physical Exam Constitutional:      General: She is not in acute distress. Musculoskeletal:        General: No deformity.  Neurological:     Mental Status: She is alert and oriented to person, place, and time.     Coordination: Coordination normal.  Psychiatric:        Attention and Perception: Attention and perception normal. She does not perceive auditory or visual hallucinations.        Mood and Affect: Mood normal. Mood is not anxious or depressed. Affect is not labile, blunt, angry or inappropriate.        Speech: Speech normal.        Behavior: Behavior normal.        Thought Content: Thought content normal. Thought content is not paranoid or delusional. Thought content does not include homicidal or suicidal ideation. Thought content does not include homicidal or suicidal plan.        Cognition and Memory: Cognition and memory normal.        Judgment: Judgment normal.     Comments: Insight intact     Lab Review:     Component Value Date/Time   NA 137 11/14/2020 1007   K 3.8  11/14/2020 1007   CL 101 11/14/2020 1007   CO2 27 11/14/2020 1007   GLUCOSE 95 11/14/2020 1007   BUN 14 11/14/2020 1007   CREATININE 0.50 11/14/2020 1007   CALCIUM 9.3 11/14/2020 1007   PROT 7.4 11/14/2020 1007   ALBUMIN 4.4 11/14/2020 1007   AST 16 11/14/2020 1007   ALT 19 11/14/2020 1007   ALKPHOS 44 11/14/2020 1007   BILITOT 0.5 11/14/2020 1007   GFRNONAA >60 11/14/2020 1007       Component Value Date/Time   WBC 10.5 11/02/2020 0828   RBC 4.28 11/02/2020 0828   HGB 13.0 11/02/2020 0828   HCT 38.1 11/02/2020 0828   PLT 256 11/02/2020 0828   MCV 89.0 11/02/2020 0828   MCH 30.4 11/02/2020 0828   MCHC 34.1 11/02/2020 0828   RDW 12.6 11/02/2020 0828   LYMPHSABS 3.5 11/02/2020 0828   MONOABS 0.6 11/02/2020 0828   EOSABS 0.1 11/02/2020 0828   BASOSABS 0.0 11/02/2020 0828    No results found  for: POCLITH, LITHIUM   No results found for: PHENYTOIN, PHENOBARB, VALPROATE, CBMZ   .res Assessment: Plan:     Plan:  PDMP reviewed  1. Adderall XR 25mg  2. Adderall 20mg  daily 3. Xanax 1mg  BID 4. Pristiq 50mg  daily 5. Pristiq 25mg  daily   RTC 8 weeks   Patient advised to contact office with any questions, adverse effects, or acute worsening in signs and symptoms.  Discussed potential benefits, risk, and side effects of benzodiazepines to include potential risk of tolerance and dependence, as well as possible drowsiness.  Advised patient not to drive if experiencing drowsiness and to take lowest possible effective dose to minimize risk of dependence and tolerance.  Discussed potential benefits, risks, and side effects of stimulants with patient to include increased heart rate, palpitations, insomnia, increased anxiety, increased irritability, or decreased appetite.  Instructed patient to contact office if experiencing any significant tolerability issues.      Diagnoses and all orders for this visit:  Attention deficit hyperactivity disorder (ADHD), combined type,  moderate -     amphetamine-dextroamphetamine (ADDERALL XR) 25 MG 24 hr capsule; Take 2 capsules by mouth daily after breakfast. -     desvenlafaxine (PRISTIQ) 50 MG 24 hr tablet; Take 1 tablet (50 mg total) by mouth daily. -     amphetamine-dextroamphetamine (ADDERALL) 20 MG tablet; Take 1 tablet (20 mg total) by mouth daily before supper. -     amphetamine-dextroamphetamine (ADDERALL) 20 MG tablet; Take 1 tablet (20 mg total) by mouth daily. -     amphetamine-dextroamphetamine (ADDERALL) 20 MG tablet; Take 1 tablet (20 mg total) by mouth daily. -     amphetamine-dextroamphetamine (ADDERALL XR) 25 MG 24 hr capsule; Take two capsules by mouth daily after breakfast. -     amphetamine-dextroamphetamine (ADDERALL XR) 25 MG 24 hr capsule; Take two capsules by mouth daily after breakfast.  Generalized anxiety disorder -     desvenlafaxine (PRISTIQ) 50 MG 24 hr tablet; Take 1 tablet (50 mg total) by mouth daily. -     Desvenlafaxine Succinate ER (PRISTIQ) 25 MG TB24; Take 25 mg by mouth daily. -     ALPRAZolam (XANAX) 1 MG tablet; Take 1 tablet (1 mg total) by mouth 2 (two) times daily as needed for anxiety.  Panic disorder -     desvenlafaxine (PRISTIQ) 50 MG 24 hr tablet; Take 1 tablet (50 mg total) by mouth daily. -     Desvenlafaxine Succinate ER (PRISTIQ) 25 MG TB24; Take 25 mg by mouth daily. -     ALPRAZolam (XANAX) 1 MG tablet; Take 1 tablet (1 mg total) by mouth 2 (two) times daily as needed for anxiety.  Mixed obsessional thoughts and acts -     desvenlafaxine (PRISTIQ) 50 MG 24 hr tablet; Take 1 tablet (50 mg total) by mouth daily. -     Desvenlafaxine Succinate ER (PRISTIQ) 25 MG TB24; Take 25 mg by mouth daily. -     ALPRAZolam (XANAX) 1 MG tablet; Take 1 tablet (1 mg total) by mouth 2 (two) times daily as needed for anxiety.     Please see After Visit Summary for patient specific instructions.  Future Appointments  Date Time Provider Department Center  11/22/2020  5:00 PM Mathis FareDowd,  Deborah, LCSW [facility] None  12/13/2020  5:00 PM Mathis Fareowd, Deborah, LCSW [facility] None  12/27/2020  5:00 PM Mathis Fareowd, Deborah, LCSW [facility] None  01/10/2021  5:00 PM Mathis Fareowd, Deborah, LCSW [facility] None  01/24/2021  5:00 PM Mathis Fareowd, Deborah, LCSW [facility]  None  02/07/2021  5:00 PM Mathis Fare, LCSW [facility] None  02/13/2021  8:10 AM Early, Sung Amabile, NP [facility]  02/21/2021  8:20 AM Onya Eutsler, Thereasa Solo, NP [facility] None    No orders of the defined types were placed in this encounter.   -------------------------------

## 2020-12-06 ENCOUNTER — Telehealth (HOSPITAL_BASED_OUTPATIENT_CLINIC_OR_DEPARTMENT_OTHER): Payer: Self-pay | Admitting: Nurse Practitioner

## 2020-12-06 NOTE — Telephone Encounter (Signed)
LVMTCB regarding canceling her cpe appt that was on 7/26 due to provider being out of the office. Told pt to call back to resch this appt.

## 2020-12-13 ENCOUNTER — Ambulatory Visit (INDEPENDENT_AMBULATORY_CARE_PROVIDER_SITE_OTHER): Payer: 59 | Admitting: Psychiatry

## 2020-12-13 ENCOUNTER — Other Ambulatory Visit: Payer: Self-pay

## 2020-12-13 DIAGNOSIS — F411 Generalized anxiety disorder: Secondary | ICD-10-CM

## 2020-12-13 NOTE — Progress Notes (Signed)
[facility] Counselor/Therapist Progress Note  Patient ID: Mariah Young, MRN: [id number],    Date: 12/13/2020  Time Spent: 60 minutes  Treatment Type: Individual Therapy  Reported Symptoms: anxiety, depression, obsessive thoughts  Mental Status Exam:  Appearance:   Casual     Behavior:  Appropriate, Sharing and Motivated  Motor:  Normal  Speech/Language:   Clear and Coherent  Affect:  anxious, depression  Mood:  anxious and depressed  Thought process:  goal directed  Thought content:    Obsessions  Sensory/Perceptual disturbances:    WNL  Orientation:  oriented to person, place, time/date, situation, day of week, month of year and year  Attention:  Good  Concentration:  Fair  Memory:  WNL  Fund of knowledge:   Good  Insight:    Fair  Judgment:   Fair  Impulse Control:  Good and Fair   Risk Assessment: Danger to Self:  No Self-injurious Behavior: No Danger to Others: No Duty to Warn:no Physical Aggression / Violence:No  Access to Firearms a concern: No  Gang Involvement:No   Subjective:  Patient in today reporting anxiety and depression related to personal issues and work, and having some obsessive thoughts mostly personal and work-related. "I got pulled off my prior assignment at work", not totally sure why but ok with it. Some other work issues and made a report to HR. Is feeling some resolution from the hurt of being turned down for another job for which she applied. Applying for a couple other jobs.  Feeling positive about her relationship of 7 months with S.O. Processed the anxiety and depression related to her job and to family. Hurts within the family and feeling "left out" in some ways. Continued work today on her anxious/fearful thoughts, using her short term goal and strategy in tx plan below, to help identify more quickly the anxious/fearful thoughts and interrupt them and then replace them with more reality-based and empowering thoughts that do not feed  her anxiety. Addressed some of her self-doubt which seemed helpful to patient and by session end her overall mood was more grounded, with less obsessive thoughts, although exhausted, and able to see some positives in her situation.     Interventions: Solution-Oriented/Positive Psychology, Ego-Supportive and Insight-Oriented  Diagnosis:   ICD-10-CM   1. Generalized anxiety disorder  F41.1       Treatment goal plan:  Patient not signing tx plan on computer screen due to Covid. Treatment Goals: Goals will remain on tx plan as patient works with strategies to achieve her goals. Progress will be noted each session and documented in "Progress" section of goal plan.  Long term goal:(Measurable) 1.Stabilize anxiety level while increasing ability to function on a daily basis. 2. Patient will eventually progress to where she rates her anxiety as a "3" or less on "1-10 Anxiety scale" for at least 2 months. Short term goal: Increase understanding of beliefs and messages that produce anxiety, worry,fear, and negativity. Strategies: Identify, challenge, and replace anxious/fearful/negative with positive, hopeful, and empoweriing self-talk.    Progress / Plan: Patient today showed good motivation in working further on her treatment goals and in facing her anxiety and depression directly which related to her job and some personal issues. Some recurring self-doubt and is working through that in order to let go and move forward. Self-awareness is good and helps her in sticking with her goal-directed behaviors. To continue working with her intercepting/replacing of her anxious/depressive thoughts to be more realistic and empowering. Also  encouraged to stay in contact with people who are supportive of her, get outside daily and walk, enjoy time with her dog which is very therapeutic for her, stay in the present focusing on what she can control or change, try not to assume worst case scenarios or that  things will go wrong versus right, become better able to deal with things when they do not go as planned, practice more consistent positive self-care and self talk, continue working to let go of negative thoughts that hold her back, and feel good about the strength that she is showing in the midst of challenging circumstances to continue working on goal-directed behaviors and coping skills as she moves forward in a more positive direction to be emotionally healthy.   Goal review and progress/challenges noted with patient.  Next appointment within 2 to 3 weeks.   Mathis Fare, LCSW

## 2020-12-27 ENCOUNTER — Other Ambulatory Visit: Payer: Self-pay

## 2020-12-27 ENCOUNTER — Ambulatory Visit (INDEPENDENT_AMBULATORY_CARE_PROVIDER_SITE_OTHER): Payer: 59 | Admitting: Psychiatry

## 2020-12-27 DIAGNOSIS — F411 Generalized anxiety disorder: Secondary | ICD-10-CM | POA: Diagnosis not present

## 2020-12-27 NOTE — Progress Notes (Signed)
[facility] Counselor/Therapist Progress Note  Patient ID: Jasman Pfeifle, MRN: [id number],    Date: 12/27/2020  Time Spent: 60 minutes  Treatment Type: Individual Therapy  Reported Symptoms: anxiety, depression, "some of my ocd symptoms"  Mental Status Exam:  Appearance:   Well Groomed     Behavior:  Appropriate, Sharing and Motivated  Motor:  Normal  Speech/Language:   Clear and Coherent  Affect:  anxiety, depression  Mood:  anxious and depressed  Thought process:  goal directed  Thought content:    some obsessiveness  Sensory/Perceptual disturbances:    WNL  Orientation:  oriented to person, place, time/date, situation, day of week, month of year and year  Attention:  Fair  Concentration:  Fair  Memory:  WNL  Fund of knowledge:   Good  Insight:    Fair  Judgment:   Fair  Impulse Control:  Fair   Risk Assessment: Danger to Self:  No Self-injurious Behavior: No Danger to Others: No Duty to Warn:no Physical Aggression / Violence:No  Access to Firearms a concern: No  Gang Involvement:No   Subjective:  Patient in today reporting anxiety, depression, and some recent increase in her ocd behaviors as "this sometimes happens under increased stress."  Was in tears and very stressed soon after entering appt today stating she had several presenting concerns she needed to talk about today. Significant other and she had a recent situation where he was not truthful with patient,and this really hurt her and made her question relationship some. Tearfully talked through her hurt, frustration, and some disappointment. Also processed some of her own thoughts/feelings towards herself that are unhealthy and undermining. Explored patient's comments about  ways that she sometimes keeps herself miserable due to disliking herself and lowered self-esteem.Tearfully reports  Anxious/"worried" thoughts about my parents even though there was nothing currently pressing re: their health.  Addressed  some of her self-doubt which seemed helpful to patient and by session end her overall mood was more grounded, with less obsessive thoughts, although exhausted, and able to see some positives in her situation. Does feel some of her ocd behaviors have been worse during this more stress time recently and shared "I notice I've been overthinking more, checking and re-checking things."  States she is focusing on decreasing her anxiety as she knows that will help decrease her OCD behaviors.  We discussed using short-term goal and strategies in treatment plan below, as we have done in a prior session and was successful in helping reduce her anxiety.     Interventions: Cognitive Behavioral Therapy, Ego-Supportive and Insight-Oriented  Diagnosis:   ICD-10-CM   1. Generalized anxiety disorder  F41.1     Treatment goal plan:  Patient not signing tx plan on computer screen due to Covid. Treatment Goals: Goals will remain on tx plan as patient works with strategies to achieve her goals. Progress will be noted each session and documented in "Progress" section of goal plan.  Long term goal:(Measurable) 1.Stabilize anxiety level while increasing ability to function on a daily basis. 2. Patient will eventually progress to where she rates her anxiety as a "3" or less on "1-10 Anxiety scale" for at least 2 months. Short term goal: Increase understanding of beliefs and messages that produce anxiety, worry,fear, and negativity. Strategies: Identify, challenge, and replace anxious/fearful/negative with positive, hopeful, and empoweriing self-talk.   Progress/plan: Patient today showed good motivation encourage and talking about some tough struggle she is having right now.  She denies any SI  and there is no indication of that.  Focused well on her treatment goals as they relate to stressful circumstances for her right now.  Still having some recurring self-doubt and patient continues working on that and  challenging it.  She is showing inner strength as well and some of her stressful circumstances shared today.  Encouraged her to continue working with treatment goal strategies of intercepting/replacing anxious/depressive thoughts to be more reality based and empowering.  Also encouraged patient to enjoy time with her dog which is very therapeutic for her, stay in the present focusing on what she can control or change, stay in contact with people who are supportive of her, stop assuming worse case scenarios, get outside daily and walk, work on adjusting better when things do not go as planned, practice more consistent positive self talk, look for positives within herself, let go of negative thoughts that hold her back, intentionally look for positives daily, and realize the strength that she is showing in the midst of stressful and sometimes chaotic circumstances as she works on goal-directed behaviors and coping skills, trying to move forward in a more positive direction for improved overall emotional health.  Goal review and progress/challenges noted with patient.  Next appointment within 2 to 3 weeks.   Mathis Fare, LCSW

## 2021-01-10 ENCOUNTER — Other Ambulatory Visit: Payer: Self-pay

## 2021-01-10 ENCOUNTER — Ambulatory Visit (INDEPENDENT_AMBULATORY_CARE_PROVIDER_SITE_OTHER): Payer: 59 | Admitting: Psychiatry

## 2021-01-10 DIAGNOSIS — F411 Generalized anxiety disorder: Secondary | ICD-10-CM

## 2021-01-10 NOTE — Progress Notes (Signed)
[facility] Counselor/Therapist Progress Note  Patient ID: Mariah Young, MRN: [id number],    Date: 01/10/2021  Time Spent: 60 minutes   Treatment Type: Individual Therapy  Reported Symptoms: anxiety, depression, "anxiety is the stronger symptom"  Mental Status Exam:  Appearance:   Neat     Behavior:  Appropriate, Sharing, and Motivated  Motor:  Normal  Speech/Language:   Clear and Coherent  Affect:  Anxious, sad, some depression  Mood:  anxious, depressed, and sad  Thought process:  goal directed  Thought content:    Some obsessiveness  Sensory/Perceptual disturbances:    WNL  Orientation:  oriented to person, place, time/date, situation, day of week, month of year, and year  Attention:  Fair  Concentration:  Fair  Memory:  WNL  Fund of knowledge:   Good  Insight:    Good  Judgment:   Good  Impulse Control:  Good   Risk Assessment: Danger to Self:  NO Self-injurious Behavior: No Danger to Others: No Duty to Warn:no Physical Aggression / Violence:No  Access to Firearms a concern: No  Gang Involvement:No   Subjective:  Patient in today reporting anxiety and depression.  States anxiety is her stronger symptom.  Very tearful at onset of session due to getting turned down for 2 job applications. Immediately equated that with her "not being good enough for anything." Tearfully processed her disappointment , hurt, and frustration fand was eventually able to feel supported and feel some relief, seeming understand that her not getting one of the jobs does not mean she is "not good enough for anything." Eventually able to feel some calmness and see some of her "positives". Continues working to decrease her anxious/fearful/negative thoughts (per tx goal plan) and recognizing more how unhealthy and undermining those thoughts are for her. Worked with some specific examples that patient shared that she struggles with including "worrying thoughts about my parents even though there  is nothing currently pressing in their lives."  Patient seemed to relate some of her anxious/fearful thoughts to a sense of worthiness "that I care about other people" but also "a frequent fear that I will lose someone that I love."  Has a lot of self doubt as to whether she could handle such a loss which we talked about more at length and she seemed to feel better and have less fear and less obsessiveness about those thoughts, but this will need more work with her as she has had such thoughts/fears for quite a while per her report.  Shares that such thoughts are worse when she is doing more overthinking and other OCD behaviors including checking and rechecking.  She added that those behaviors tend to worsen when she is upset and that has been the case more recently especially with some job rejections.  Did well in talking through several issues today in session and was more calm and grounded upon leaving.    Interventions: Cognitive Behavioral Therapy and Solution-Oriented/Positive Psychology  Diagnosis:   ICD-10-CM   1. Generalized anxiety disorder  F41.1       Plan:  Patient showing good efforts although she was struggling with some motivation earlier in session.  Ended up being more focused and doing some serious work with some of her anxious/fearful/negative thought patterns and seemed to gain more insight and realizing her need to work further on this which we will follow-up on. Continued to eventually focus well on goal-directed behaviors as they relate to stressful situations for patient right now.  Confronted more of her recurring self-doubt and excessive worrying.  Showing more inner strength and less avoidance.  Encouraged patient to continue working with treatment goal strategies of intercepting and replacing anxious/negative/depressive/fearful thoughts to be more reality based and empowering as she needs to continue working on this between sessions.  Encouraged patient also to continue  other behaviors which have proven to be helpful to her in between sessions including staying in the present focusing on what she can control, enjoying time with her dog which is very therapeutic for her, staying in contact with people who are supportive of her, get outside daily, work on adjusting better when things do not go as planned, stop assuming worst case scenarios, practice consistent positive self talk, let go of negative thoughts that hold her back, intentionally look for positives daily, intentionally look for positives within herself, and feel good about the strength that she is showing as she works with goal-directed behaviors in the midst of challenging and often changing circumstances as she tries to move forward in a more positive direction emotionally.  Goal review and progress/challenges noted with patient.  Next appt within 2 weeks.   Mathis Fare, LCSW

## 2021-01-24 ENCOUNTER — Ambulatory Visit: Payer: 59 | Admitting: Psychiatry

## 2021-01-31 ENCOUNTER — Telehealth: Payer: Self-pay

## 2021-01-31 NOTE — Telephone Encounter (Signed)
Prior Approval completed over the phone with [facility] at [phone] for ADDERALL XR 25 MG #60 effective 01/31/2021-01/31/2022   PA# [id number]  Unable to complete with cover my meds, pt not found.

## 2021-02-07 ENCOUNTER — Ambulatory Visit (INDEPENDENT_AMBULATORY_CARE_PROVIDER_SITE_OTHER): Payer: 59 | Admitting: Psychiatry

## 2021-02-07 ENCOUNTER — Other Ambulatory Visit: Payer: Self-pay

## 2021-02-07 DIAGNOSIS — F411 Generalized anxiety disorder: Secondary | ICD-10-CM | POA: Diagnosis not present

## 2021-02-07 NOTE — Progress Notes (Signed)
[facility] Counselor/Therapist Progress Note  Patient ID: Mariah Young, MRN: [id number],    Date: 02/07/2021  Time Spent: 60 minutes   Treatment Type: Individual Therapy  Reported Symptoms: anxiety, depression  Mental Status Exam:  Appearance:   Neat     Behavior:  Appropriate, Sharing, and Motivated  Motor:  Normal  Speech/Language:   Clear and Coherent and Normal Rate  Affect:  Anxious, some depression  Mood:  anxious and depressed  Thought process:  goal directed  Thought content:    WNL  Sensory/Perceptual disturbances:    WNL  Orientation:  oriented to person, place, time/date, situation, day of week, month of year, year, and stated date of February 07, 2021  Attention:  Good  Concentration:  Good and Fair  Memory:  WNL  Fund of knowledge:   Good  Insight:    Good and Fair  Judgment:   Good and Fair  Impulse Control:  Good and Fair   Risk Assessment: Danger to Self:  No Self-injurious Behavior: No Danger to Others: No Duty to Warn:no Physical Aggression / Violence:No  Access to Firearms a concern: No  Gang Involvement:No   Subjective: Patient in today reporting anxiety and depression,  with anxiety being the strongest, mostly regarding personal and work.  Work stress has been overwhelming and "I feel like I just can't win, try to do the right thing but it's never interpreted that way." Thinking about my future more in terms of career and considering options. Some improvement in her self-esteem, and needing to continue work on this. Tendency to look  for "what might go wrong than right" and worked with this some in session using some CBT and turning it around to look for what may go right versus wrong. Worked also her not being so self-critical with negative self-talk, and changing it to be more positive and empowering.  Has a job interview tomorrow that she is very anxious about (and has stirred up some of her older feelings of unworthiness)  and spent some time  processing that before the session ended.  Seemed to feel more grounded and definitely looking less "for what might go wrong ".  Interventions: Cognitive Behavioral Therapy and Solution-Oriented/Positive Psychology  Diagnosis:   ICD-10-CM   1. Generalized anxiety disorder  F41.1       Plan of Care:  Patient not signing tx plan on computer screen due to Covid. Treatment Goals: Goals will remain on tx plan as patient works with strategies to achieve her goals. Progress will be noted each session and documented in "Progress" section of goal plan. Long term goal: (Measurable) 1. Stabilize anxiety level while increasing ability to function on a daily basis.  2. Patient will eventually progress to where she rates her anxiety as a "3" or less on "1-10 Anxiety scale" for at least 2 months. Short term goal: Increase understanding of beliefs and messages that produce anxiety, worry,fear, and negativity.  Strategies: Identify, challenge, and replace anxious/fearful/negative with positive, hopeful, and empoweriing self-talk.   Plan:  Patient today showing good motivation and efforts today.  Worked well with certain anxious thoughts, including working harder on that trait of looking for what might go wrong versus right.  Also did well in our review about better managing stress especially when it builds up within her.  Continues to show more inner strength and less avoidance of issues.  Encouraged patient to use some behaviors that have been helpful previously in between sessions including: Interrupting  and replacing anxious/negative/depressive/fearful thoughts to be more reality based and empowering, stay in the present focusing on what she can control, enjoy time with her dog which is very therapeutic for her, get outside daily and walk, work on adjusting better when things do not go as planned, staying in contact with people who are supportive of her, stop assuming worst case scenarios, stop self  negating, practice consistent positive self talk, let go of negative thoughts that hold her back, intentionally look for positives daily, look for positives within herself, and recognize the strength that she is showing as she works with goal-directed behaviors in the midst of challenging circumstances in trying to move forward in a more positive direction.  Goal review and progress/challenges noted with patient.  Next appointment within 2 weeks.   Mathis Fare, LCSW

## 2021-02-13 ENCOUNTER — Encounter (HOSPITAL_BASED_OUTPATIENT_CLINIC_OR_DEPARTMENT_OTHER): Payer: 59 | Admitting: Nurse Practitioner

## 2021-02-21 ENCOUNTER — Other Ambulatory Visit: Payer: Self-pay

## 2021-02-21 ENCOUNTER — Ambulatory Visit (INDEPENDENT_AMBULATORY_CARE_PROVIDER_SITE_OTHER): Payer: 59 | Admitting: Psychiatry

## 2021-02-21 ENCOUNTER — Encounter: Payer: Self-pay | Admitting: Adult Health

## 2021-02-21 ENCOUNTER — Ambulatory Visit (INDEPENDENT_AMBULATORY_CARE_PROVIDER_SITE_OTHER): Payer: 59 | Admitting: Adult Health

## 2021-02-21 DIAGNOSIS — F411 Generalized anxiety disorder: Secondary | ICD-10-CM

## 2021-02-21 DIAGNOSIS — F422 Mixed obsessional thoughts and acts: Secondary | ICD-10-CM | POA: Diagnosis not present

## 2021-02-21 DIAGNOSIS — F410 Panic disorder [episodic paroxysmal anxiety] without agoraphobia: Secondary | ICD-10-CM

## 2021-02-21 DIAGNOSIS — F902 Attention-deficit hyperactivity disorder, combined type: Secondary | ICD-10-CM | POA: Diagnosis not present

## 2021-02-21 MED ORDER — AMPHETAMINE-DEXTROAMPHET ER 25 MG PO CP24: ORAL_CAPSULE | ORAL | 0 refills | Status: DC

## 2021-02-21 MED ORDER — AMPHETAMINE-DEXTROAMPHET ER 25 MG PO CP24: 50.0000 mg | ORAL_CAPSULE | Freq: Every day | ORAL | 0 refills | Status: DC

## 2021-02-21 MED ORDER — ALPRAZOLAM 1 MG PO TABS: 1.0000 mg | ORAL_TABLET | Freq: Two times a day (BID) | ORAL | 2 refills | Status: DC | PRN

## 2021-02-21 NOTE — Progress Notes (Signed)
Mariah Young [id number] May 23, 1996 25 y.o.  Subjective:   Patient ID:  Mariah Young is a 25 y.o. (DOB November 17, 1995) female.  Chief Complaint: No chief complaint on file.   HPI Mariah Young presents to the office today for follow-up of ADHD, panic disorder, GAD, and obsessional thoughts.   Describes mood today as "ok". Pleasant. Tearful "all the time". Mood symptoms - reports depression, anxiety, and irritability. Reports having her first panic attack in a while. Stating "I'm not doing incredibly well". Has been out of work this week -sick - negative for Covid. Does not want to change current medication regimen. Feels like medications are working well.  Increased situational stressors - mostly work related. She and boyfriend celebrating 2 month anniversary. Planning a trip to [place] to see sister. Seeing Rockne Menghini for therapy. Stable interest and motivation. Taking medications as prescribed.  Energy levels lower. Active, does not have a regular exercise routine. Enjoys some usual interests and activities. Single. Has a boyfriend. Lives alone with dog. Family in [place]. Appetite adequate. Weight gain 5 pounds - 175 pounds. Sleeping better some nights than others. Averages 6 hours.  Focus and concentration stable. Completing tasks. Managing aspects of household. Works full-time - 14 to 16 hours a day. Looking for  new job - feeling "burnt out". Denies SI or HI.  Denies AH or VH.  Previous medication trials: Prozac    GAD-7    Flowsheet Row Office Visit from 11/14/2020 in [facility]  Total GAD-7 Score 11      PHQ2-9    Flowsheet Row Office Visit from 11/14/2020 in [facility]  PHQ-2 Total Score 3  PHQ-9 Total Score 14      Flowsheet Row ED from 11/02/2020 in [facility] ED from 10/27/2020 in [facility] ED from  10/24/2020 in [facility]  C-SSRS RISK CATEGORY No Risk No Risk No Risk        Review of Systems:  Review of Systems  Musculoskeletal:  Negative for gait problem.  Neurological:  Negative for tremors.  Psychiatric/Behavioral:         Please refer to HPI   Medications: I have reviewed the patient's current medications.  Current Outpatient Medications  Medication Sig Dispense Refill   ALPRAZolam (XANAX) 1 MG tablet Take 1 tablet (1 mg total) by mouth 2 (two) times daily as needed for anxiety. 60 tablet 2   amphetamine-dextroamphetamine (ADDERALL XR) 25 MG 24 hr capsule Take 2 capsules by mouth daily after breakfast. 60 capsule 0   [START ON 03/21/2021] amphetamine-dextroamphetamine (ADDERALL XR) 25 MG 24 hr capsule Take two capsules by mouth daily after breakfast. 60 capsule 0   [START ON 04/18/2021] amphetamine-dextroamphetamine (ADDERALL XR) 25 MG 24 hr capsule Take two capsules by mouth daily after breakfast. 60 capsule 0   amphetamine-dextroamphetamine (ADDERALL) 20 MG tablet Take 1 tablet (20 mg total) by mouth daily before supper. 30 tablet 0   amphetamine-dextroamphetamine (ADDERALL) 20 MG tablet Take 1 tablet (20 mg total) by mouth daily. 30 tablet 0   amphetamine-dextroamphetamine (ADDERALL) 20 MG tablet Take 1 tablet (20 mg total) by mouth daily. 30 tablet 0   cetirizine (ZYRTEC ALLERGY) 10 MG tablet Take 1 tablet (10 mg total) by mouth daily. 30 tablet 0   desogestrel-ethinyl estradiol (MIRCETTE) 0.15-0.02/0.01 MG (21/5) tablet      desvenlafaxine (PRISTIQ) 50 MG 24 hr tablet Take 1 tablet (50 mg total) by  mouth daily. 30 tablet 5   Desvenlafaxine Succinate ER (PRISTIQ) 25 MG TB24 Take 25 mg by mouth daily. 30 tablet 5   fluconazole (DIFLUCAN) 150 MG tablet Take 1 tab (150mg ) by mouth on last day of antibiotic treatment. May repeat dose in 3 days if symptoms persist. 2 tablet 0   Multiple Vitamin (ONE-DAILY MULTI-VITAMIN) TABS Take 1 tablet by mouth  daily.     nitrofurantoin, macrocrystal-monohydrate, (MACROBID) 100 MG capsule Take 1 capsule (100 mg total) by mouth 2 (two) times daily. 14 capsule 0   No current facility-administered medications for this visit.    Medication Side Effects: None  Allergies:  Allergies  Allergen Reactions   Clarithromycin Other (See Comments) and Rash    Vasculitis Vasculitis    Penicillins Other (See Comments)    Other reaction(s): Intolerance vasculitis vasculitis     Past Medical History:  Diagnosis Date   Acute non-recurrent maxillary sinusitis 07/12/2017   ADHD (attention deficit hyperactivity disorder)    Anxiety    Cough 07/12/2017   Diaphoresis    Dysmenorrhea    IUD (intrauterine device) in place    Obesity (BMI 30.0-34.9)    Obsessive compulsive disorder     Past Medical History, Surgical history, Social history, and Family history were reviewed and updated as appropriate.   Please see review of systems for further details on the patient's review from today.   Objective:   Physical Exam:  There were no vitals taken for this visit.  Physical Exam Constitutional:      General: She is not in acute distress. Musculoskeletal:        General: No deformity.  Neurological:     Mental Status: She is alert and oriented to person, place, and time.     Coordination: Coordination normal.  Psychiatric:        Attention and Perception: Attention and perception normal. She does not perceive auditory or visual hallucinations.        Mood and Affect: Mood normal. Mood is not anxious or depressed. Affect is not labile, blunt, angry or inappropriate.        Speech: Speech normal.        Behavior: Behavior normal.        Thought Content: Thought content normal. Thought content is not paranoid or delusional. Thought content does not include homicidal or suicidal ideation. Thought content does not include homicidal or suicidal plan.        Cognition and Memory: Cognition and memory  normal.        Judgment: Judgment normal.     Comments: Insight intact    Lab Review:     Component Value Date/Time   NA 137 11/14/2020 1007   K 3.8 11/14/2020 1007   CL 101 11/14/2020 1007   CO2 27 11/14/2020 1007   GLUCOSE 95 11/14/2020 1007   BUN 14 11/14/2020 1007   CREATININE 0.50 11/14/2020 1007   CALCIUM 9.3 11/14/2020 1007   PROT 7.4 11/14/2020 1007   ALBUMIN 4.4 11/14/2020 1007   AST 16 11/14/2020 1007   ALT 19 11/14/2020 1007   ALKPHOS 44 11/14/2020 1007   BILITOT 0.5 11/14/2020 1007   GFRNONAA >60 11/14/2020 1007       Component Value Date/Time   WBC 10.5 11/02/2020 0828   RBC 4.28 11/02/2020 0828   HGB 13.0 11/02/2020 0828   HCT 38.1 11/02/2020 0828   PLT 256 11/02/2020 0828   MCV 89.0 11/02/2020 0828   MCH 30.4 11/02/2020 0828  MCHC 34.1 11/02/2020 0828   RDW 12.6 11/02/2020 0828   LYMPHSABS 3.5 11/02/2020 0828   MONOABS 0.6 11/02/2020 0828   EOSABS 0.1 11/02/2020 0828   BASOSABS 0.0 11/02/2020 0828    No results found for: POCLITH, LITHIUM   No results found for: PHENYTOIN, PHENOBARB, VALPROATE, CBMZ   .res Assessment: Plan:    Plan:  PDMP reviewed  1. Adderall XR 25mg  2. Adderall 20mg  daily 3. Xanax 1mg  BID 4. Pristiq 50mg  daily 5. Pristiq 25mg  daily  RTC 8 weeks   Patient advised to contact office with any questions, adverse effects, or acute worsening in signs and symptoms.  Discussed potential benefits, risk, and side effects of benzodiazepines to include potential risk of tolerance and dependence, as well as possible drowsiness.  Advised patient not to drive if experiencing drowsiness and to take lowest possible effective dose to minimize risk of dependence and tolerance.  Discussed potential benefits, risks, and side effects of stimulants with patient to include increased heart rate, palpitations, insomnia, increased anxiety, increased irritability, or decreased appetite.  Instructed patient to contact office if experiencing any  significant tolerability issues.       Diagnoses and all orders for this visit:  Attention deficit hyperactivity disorder (ADHD), combined type, moderate -     amphetamine-dextroamphetamine (ADDERALL XR) 25 MG 24 hr capsule; Take 2 capsules by mouth daily after breakfast. -     amphetamine-dextroamphetamine (ADDERALL XR) 25 MG 24 hr capsule; Take two capsules by mouth daily after breakfast. -     amphetamine-dextroamphetamine (ADDERALL XR) 25 MG 24 hr capsule; Take two capsules by mouth daily after breakfast.  Generalized anxiety disorder -     ALPRAZolam (XANAX) 1 MG tablet; Take 1 tablet (1 mg total) by mouth 2 (two) times daily as needed for anxiety.  Panic disorder -     ALPRAZolam (XANAX) 1 MG tablet; Take 1 tablet (1 mg total) by mouth 2 (two) times daily as needed for anxiety.  Mixed obsessional thoughts and acts -     ALPRAZolam (XANAX) 1 MG tablet; Take 1 tablet (1 mg total) by mouth 2 (two) times daily as needed for anxiety.    Please see After Visit Summary for patient specific instructions.  Future Appointments  Date Time Provider Department Center  02/21/2021  5:00 PM , LCSW [facility] None  03/07/2021  5:00 PM , LCSW [facility] None  03/21/2021  5:00 PM 04/23/2021, LCSW [facility] None  04/04/2021  5:00 PM 03/09/2021, LCSW [facility] None  04/18/2021  5:00 PM 03/23/2021, LCSW [facility] None  05/02/2021  5:00 PM 04/06/2021, LCSW [facility] None    No orders of the defined types were placed in this encounter.   -------------------------------

## 2021-02-21 NOTE — Progress Notes (Signed)
[facility] Counselor/Therapist Progress Note  Patient ID: Mariah Young, MRN: [id number],    Date: 02/21/2021  Time Spent: 58 minutes   Treatment Type: Individual Therapy  Reported Symptoms: anxiety, depression  Mental Status Exam:  Appearance:   Casual     Behavior:  Appropriate, Sharing, and Motivated  Motor:  Normal  Speech/Language:   Clear and Coherent  Affect:  anxious  Mood:  anxious, some depression  Thought process:  goal directed  Thought content:    Some obsessiveness  Sensory/Perceptual disturbances:    WNL  Orientation:  oriented to person, place, time/date, situation, day of week, month of year, year, and stated date of Aug. 3, 2022  Attention:  Good  Concentration:  Good  Memory:  WNL  Fund of knowledge:   Good  Insight:    Good  Judgment:   Good  Impulse Control:  Good   Risk Assessment: Danger to Self:  No Self-injurious Behavior: No Danger to Others: No Duty to Warn:no Physical Aggression / Violence:No  Access to Firearms a concern: No  Gang Involvement:No    Subjective:  Patient in today reporting anxiety, depression, and frustration (mostly with work).  Has been more on edge, anxious, and depressed due to personal and work situations. Denies any SI. States anxiety is the stronger symptom more recently and has heightened due to some work issues. Situation at work is becoming more of a concern for patient. Today very anxious/stressed regarding some decisions she is considering and processed her anxieties and concerns today, and looked at some options that she sees are possible for her. Worked also on some anxiety management including controlled deep breathing exercises, improved self-talk, and not assuming worst case scenarios. Despite her current struggles, her self-esteem has strengthened some and is a work in progress. Better able to interrupt some of her negative thoughts and change them to more positive and empowering thoughts that do not feed  her anxiety/depression. Trying to lean towards being more optimistic and looking more for "what might go right versus wrong".     Interventions: Cognitive Behavioral Therapy and Insight-Oriented  Diagnosis:   ICD-10-CM   1. Generalized anxiety disorder  F41.1       Plan of Care:  Patient not signing tx plan on computer screen due to Covid. Treatment Goals: Goals will remain on tx plan as patient works with strategies to achieve her goals. Progress will be noted each session and documented in "Progress" section of goal plan. Long term goal: (Measurable) 1. Stabilize anxiety level while increasing ability to function on a daily basis.  2. Patient will eventually progress to where she rates her anxiety as a "3" or less on "1-10 Anxiety scale" for at least 2 months. Short term goal: Increase understanding of beliefs and messages that produce anxiety, worry,fear, and negativity.  Strategies: Identify, challenge, and replace anxious/fearful/negative with positive, hopeful, and empoweriing self-talk.     Plan:  Patient today showing good motivation and engaged throughout session.  Worked well in better management of some of her anxious and depressed thoughts especially in recognizing them and changing them to be more reality based and empowering thoughts.  Was able to better see and understand the connection between her thoughts and her feelings.  Reviewed strategies for better management of anxiety as noted above.  Was calm and grounded toward the end of session and was intentionally working to be more optimistic and looking more for positive outcomes versus negative and current situations in  which she is involved, especially at work. Encouraged patient to use some behaviors that have previously been helpful between sessions including: Staying in the present and focusing on what she can control, interrupting and replacing anxious/negative/depressive/fearful thoughts to be more reality based and  empowering thoughts that do not feed her anxiety, enjoying time with her dog which is very therapeutic for her, getting outside daily and walking, work on adjusting more easily when things do not go as planned, staying in contact with people who are supportive of her of her, stop self negating, stop assuming worst case scenarios, practice consistent positive self talk, intentionally looking for positives within herself, intentionally looking for more positives than negatives daily, letting go of negative thoughts and assumptions that hold her back, practicing self forgiveness, and recognizing the strength that she is showing as she works with goal-directed behaviors in the midst of challenging circumstances in trying to move forward in a more positive direction.  Goal review and progress/challenges noted with patient.  Next appointment within 2 to 3 weeks.   Mathis Fare, LCSW

## 2021-02-22 ENCOUNTER — Ambulatory Visit
Admission: RE | Admit: 2021-02-22 | Discharge: 2021-02-22 | Disposition: A | Discharge status: Home / Self Care | Payer: 59 | Source: Ambulatory Visit | Attending: Emergency Medicine | Admitting: Emergency Medicine

## 2021-02-22 VITALS — BP 126/81 | HR 103 | Temp 98.8°F | Ht 66.0 in | Wt 174.0 lb

## 2021-02-22 DIAGNOSIS — R059 Cough, unspecified: Secondary | ICD-10-CM | POA: Diagnosis not present

## 2021-02-22 DIAGNOSIS — J019 Acute sinusitis, unspecified: Secondary | ICD-10-CM | POA: Diagnosis not present

## 2021-02-22 MED ORDER — CETIRIZINE HCL 10 MG PO CAPS: 10.0000 mg | ORAL_CAPSULE | Freq: Every day | ORAL | 0 refills | Status: DC

## 2021-02-22 MED ORDER — DOXYCYCLINE HYCLATE 100 MG PO CAPS: 100.0000 mg | ORAL_CAPSULE | Freq: Two times a day (BID) | ORAL | 0 refills | Status: AC

## 2021-02-22 MED ORDER — BENZONATATE 200 MG PO CAPS: 200.0000 mg | ORAL_CAPSULE | Freq: Three times a day (TID) | ORAL | 0 refills | Status: AC | PRN

## 2021-02-22 MED ORDER — FLUTICASONE PROPIONATE 50 MCG/ACT NA SUSP: 1.0000 | Freq: Every day | NASAL | 0 refills | Status: DC

## 2021-02-22 NOTE — Discharge Instructions (Addendum)
Begin doxycycline twice daily x1 week Flonase nasal spray 1 to 2 spray in each nostril daily to help with congestion, fluid on ears Daily cetirizine help with congestion and postnasal drainage/throat irritation Tessalon every 8 hours for cough or may use over-the-counter Delsym, Robitussin Mucinex DM or Sudafed for further congestion relief Tylenol and ibuprofen for throat pain Follow-up if not improving or worsening

## 2021-02-22 NOTE — ED Triage Notes (Signed)
Patient c/o sore throat, congestion, cough, runny nose x 1 week.  Patient took 2 at home COVID tests both negative.  Weak feeling.  Denies any meds.

## 2021-02-22 NOTE — ED Provider Notes (Signed)
[facility]    CSN: [id number] Arrival date & time: 02/22/21  1202      History   Chief Complaint Chief Complaint  Patient presents with   Sore Throat   Weakness    HPI Keshonda Monsour is a 25 y.o. female presenting today for evaluation of URI symptoms.  Reports that over the past week she has had cough congestion, generalized fatigue and sore throat.  Reports 2 at home COVID test negative, denies known COVID exposures.  At times feeling fullness on ears.  Has missed work and needs work note to return.  HPI  Past Medical History:  Diagnosis Date   Acute non-recurrent maxillary sinusitis 07/12/2017   ADHD (attention deficit hyperactivity disorder)    Anxiety    Cough 07/12/2017   Diaphoresis    Dysmenorrhea    IUD (intrauterine device) in place    Obesity (BMI 30.0-34.9)    Obsessive compulsive disorder     Patient Active Problem List   Diagnosis Date Noted   Encounter to establish care 11/14/2020   Hematuria 11/14/2020   E-coli UTI 11/14/2020   Infectious mononucleosis without complication 11/14/2020   Vasovagal syncope 11/14/2020   Generalized anxiety disorder 06/14/2019   Obsessive compulsive disorder 06/14/2019   Panic disorder 06/14/2019   Attention deficit hyperactivity disorder (ADHD), combined type 12/07/2018   Anxiety 12/07/2018    Past Surgical History:  Procedure Laterality Date   BREAST LUMPECTOMY Right 2014   BREAST SURGERY     REDUCTION MAMMAPLASTY Bilateral 2013    OB History   No obstetric history on file.      Home Medications    Prior to Admission medications   Medication Sig Start Date End Date Taking? Authorizing Provider  ALPRAZolam Prudy Feeler) 1 MG tablet Take 1 tablet (1 mg total) by mouth 2 (two) times daily as needed for anxiety. 02/21/21  Yes Mozingo, Thereasa Solo, NP  amphetamine-dextroamphetamine (ADDERALL XR) 25 MG 24 hr capsule Take 2 capsules by mouth daily after breakfast. 02/21/21  Yes Mozingo, Thereasa Solo, NP   amphetamine-dextroamphetamine (ADDERALL XR) 25 MG 24 hr capsule Take two capsules by mouth daily after breakfast. 03/21/21  Yes Mozingo, Thereasa Solo, NP  amphetamine-dextroamphetamine (ADDERALL XR) 25 MG 24 hr capsule Take two capsules by mouth daily after breakfast. 04/18/21  Yes Mozingo, Thereasa Solo, NP  amphetamine-dextroamphetamine (ADDERALL) 20 MG tablet Take 1 tablet (20 mg total) by mouth daily before supper. 11/22/20  Yes Mozingo, Thereasa Solo, NP  amphetamine-dextroamphetamine (ADDERALL) 20 MG tablet Take 1 tablet (20 mg total) by mouth daily. 12/20/20  Yes Mozingo, Thereasa Solo, NP  amphetamine-dextroamphetamine (ADDERALL) 20 MG tablet Take 1 tablet (20 mg total) by mouth daily. 01/17/21  Yes Mozingo, Thereasa Solo, NP  benzonatate (TESSALON) 200 MG capsule Take 1 capsule (200 mg total) by mouth 3 (three) times daily as needed for up to 7 days for cough. 02/22/21 03/01/21 Yes Albi Rappaport C, PA-C  Cetirizine HCl 10 MG CAPS Take 1 capsule (10 mg total) by mouth daily for 10 days. 02/22/21 03/04/21 Yes Delos Klich C, PA-C  desvenlafaxine (PRISTIQ) 50 MG 24 hr tablet Take 1 tablet (50 mg total) by mouth daily. 11/22/20  Yes Mozingo, Thereasa Solo, NP  Desvenlafaxine Succinate ER (PRISTIQ) 25 MG TB24 Take 25 mg by mouth daily. 11/22/20  Yes Mozingo, Thereasa Solo, NP  doxycycline (VIBRAMYCIN) 100 MG capsule Take 1 capsule (100 mg total) by mouth 2 (two) times daily for 7 days. 02/22/21 03/01/21 Yes Emili Mcloughlin C, PA-C  fluticasone (FLONASE) 50 MCG/ACT nasal spray Place 1-2 sprays into both nostrils daily. 02/22/21  Yes Ilana Prezioso C, PA-C  Multiple Vitamin (ONE-DAILY MULTI-VITAMIN) TABS Take 1 tablet by mouth daily.   Yes [provider]    Family History Family History  Problem Relation Age of Onset   Hypertension Mother    Hypertension Father    Colon cancer Maternal Grandfather    Lung cancer Paternal Grandfather     Social History Social History   Tobacco  Use   Smoking status: Never   Smokeless tobacco: Never  Vaping Use   Vaping Use: Never used  Substance Use Topics   Alcohol use: Yes   Drug use: Never     Allergies   Clarithromycin and Penicillins   Review of Systems Review of Systems  Constitutional:  Negative for activity change, appetite change, chills, fatigue and fever.  HENT:  Positive for congestion. Negative for ear pain, rhinorrhea, sinus pressure, sore throat and trouble swallowing.   Eyes:  Negative for discharge and redness.  Respiratory:  Positive for cough. Negative for chest tightness and shortness of breath.   Cardiovascular:  Negative for chest pain.  Gastrointestinal:  Negative for abdominal pain, diarrhea, nausea and vomiting.  Musculoskeletal:  Negative for myalgias.  Skin:  Negative for rash.  Neurological:  Negative for dizziness, light-headedness and headaches.    Physical Exam Triage Vital Signs ED Triage Vitals  Enc Vitals Group     BP 02/22/21 1227 126/81     Pulse Rate 02/22/21 1227 (!) 103     Resp --      Temp 02/22/21 1227 98.8 F (37.1 C)     Temp Source 02/22/21 1227 Oral     SpO2 02/22/21 1227 97 %     Weight 02/22/21 1228 174 lb (78.9 kg)     Height 02/22/21 1228 5\' 6"  (1.676 m)     Head Circumference --      Peak Flow --      Pain Score 02/22/21 1227 7     Pain Loc --      Pain Edu? --      Excl. in GC? --    No data found.  Updated Vital Signs BP 126/81 (BP Location: Left Arm)   Pulse (!) 103   Temp 98.8 F (37.1 C) (Oral)   Ht 5\' 6"  (1.676 m)   Wt 174 lb (78.9 kg)   SpO2 97%   BMI 28.08 kg/m   Visual Acuity Right Eye Distance:   Left Eye Distance:   Bilateral Distance:    Right Eye Near:   Left Eye Near:    Bilateral Near:     Physical Exam Vitals and nursing note reviewed.  Constitutional:      Appearance: She is well-developed.     Comments: No acute distress  HENT:     Head: Normocephalic and atraumatic.     Ears:     Comments: Bilateral ears  without tenderness to palpation of external auricle, tragus and mastoid, EAC's without erythema or swelling, TM's with good bony landmarks and cone of light. Non erythematous.  Clear effusion bilaterally      Nose: Nose normal.     Mouth/Throat:     Comments: Oral mucosa pink and moist, no tonsillar enlargement or exudate. Posterior pharynx patent and nonerythematous, no uvula deviation or swelling. Normal phonation.  Eyes:     Conjunctiva/sclera: Conjunctivae normal.  Cardiovascular:     Rate and Rhythm: Normal rate and  regular rhythm.  Pulmonary:     Effort: Pulmonary effort is normal. No respiratory distress.     Comments: Breathing comfortably at rest, CTABL, no wheezing, rales or other adventitious sounds auscultated  Abdominal:     General: There is no distension.  Musculoskeletal:        General: Normal range of motion.     Cervical back: Neck supple.  Skin:    General: Skin is warm and dry.  Neurological:     Mental Status: She is alert and oriented to person, place, and time.     UC Treatments / Results  Labs (all labs ordered are listed, but only abnormal results are displayed) Labs Reviewed - No data to display  EKG   Radiology No results found.  Procedures Procedures (including critical care time)  Medications Ordered in UC Medications - No data to display  Initial Impression / Assessment and Plan / UC Course  I have reviewed the triage vital signs and the nursing notes.  Pertinent labs & imaging results that were available during my care of the patient were reviewed by me and considered in my medical decision making (see chart for details).    URI symptoms/cough x1 week, covering for sinusitis with doxycycline, continue symptomatic and supportive care, recommended Zyrtec and Flonase help with congestion and drainage, Tessalon for cough, may continue other over-the-counter medicines as needed for symptoms.  Rest and fluids.  Discussed strict return  precautions. Patient verbalized understanding and is agreeable with plan.  Final Clinical Impressions(s) / UC Diagnoses   Final diagnoses:  Acute sinusitis with symptoms > 10 days  Cough     Discharge Instructions      Begin doxycycline twice daily x1 week Flonase nasal spray 1 to 2 spray in each nostril daily to help with congestion, fluid on ears Daily cetirizine help with congestion and postnasal drainage/throat irritation Tessalon every 8 hours for cough or may use over-the-counter Delsym, Robitussin Mucinex DM or Sudafed for further congestion relief Tylenol and ibuprofen for throat pain Follow-up if not improving or worsening   ED Prescriptions     Medication Sig Dispense Auth. Provider   doxycycline (VIBRAMYCIN) 100 MG capsule Take 1 capsule (100 mg total) by mouth 2 (two) times daily for 7 days. 14 capsule Yahia Bottger C, PA-C   benzonatate (TESSALON) 200 MG capsule Take 1 capsule (200 mg total) by mouth 3 (three) times daily as needed for up to 7 days for cough. 28 capsule Williom Cedar C, PA-C   fluticasone (FLONASE) 50 MCG/ACT nasal spray Place 1-2 sprays into both nostrils daily. 16 g Antanette Richwine C, PA-C   Cetirizine HCl 10 MG CAPS Take 1 capsule (10 mg total) by mouth daily for 10 days. 10 capsule Rosabelle Jupin, [place] C, PA-C      PDMP not reviewed this encounter.   Sharyon Cable [place] C, PA-C 02/22/21 1525

## 2021-03-07 ENCOUNTER — Ambulatory Visit (INDEPENDENT_AMBULATORY_CARE_PROVIDER_SITE_OTHER): Payer: 59 | Admitting: Psychiatry

## 2021-03-07 ENCOUNTER — Other Ambulatory Visit: Payer: Self-pay

## 2021-03-07 DIAGNOSIS — F411 Generalized anxiety disorder: Secondary | ICD-10-CM

## 2021-03-07 NOTE — Progress Notes (Signed)
[facility] Counselor/Therapist Progress Note  Patient ID: Mariah Young, MRN: [id number],    Date: 03/07/2021  Time Spent: 58 minutes  Treatment Type: Individual Therapy  Reported Symptoms: anxiety, frustration, tearfulness  Mental Status Exam:  Appearance:   Casual     Behavior:  Appropriate, Sharing, and Motivated  Motor:  Normal  Speech/Language:   Normal Rate  Affect:  Anxiety, Tearfulness  Mood:  anxious, depressed, and tearful  Thought process:  goal directed  Thought content:    Obsessions  Sensory/Perceptual disturbances:    WNL  Orientation:  oriented to person, place, time/date, situation, day of week, month of year, year, and stated date of Aug. 17, 2022  Attention:  Fair  Concentration:  Fair  Memory:  WNL  Fund of knowledge:   Good  Insight:    Good  Judgment:   Good  Impulse Control:  Good and Fair   Risk Assessment: Danger to Self:  No Self-injurious Behavior: No Danger to Others: No Duty to Warn:no Physical Aggression / Violence:No  Access to Firearms a concern: No  Gang Involvement:No   Subjective:   Patient in today reporting anxiety, depression, frustration, tearfulness. Denies any SI. "Has had a really bad day today and it started with her falling at home and hurting her ankle." Hasn't had a chance to have ankle checked but plans to do so tonight.  Job stressors have worsened significantly. Very upset and Very tearful as she describes how bad the day has been and how it has made her "feel even worse."  Had tried to appy for job but that doesn't seem to be working out.Venting her frustration, sadness, and some anger. Issues with S.O. right now that are heightened also. Processing lots of hurts and anxieties and fears but did eventually become more calm and emotionally grounded and better able to talk through her thoughts and feelings. Still managing a lot of stress and uncertainty but calmer and and    Interventions: Cognitive Behavioral  Therapy, Solution-Oriented/Positive Psychology, and Ego-Supportive  Diagnosis:   ICD-10-CM   1. Generalized anxiety disorder  F41.1       Plan of Care:  Patient not signing tx plan on computer screen due to Covid. Treatment Goals: Goals will remain on tx plan as patient works with strategies to achieve her goals. Progress will be noted each session and documented in "Progress" section of goal plan. Long term goal: (Measurable) 1. Stabilize anxiety level while increasing ability to function on a daily basis.  2. Patient will eventually progress to where she rates her anxiety as a "3" or less on "1-10 Anxiety scale" for at least 2 months. Short term goal: Increase understanding of beliefs and messages that produce anxiety, worry,fear, and negativity.  Strategies: Identify, challenge, and replace anxious/fearful/negative with positive, hopeful, and empoweriing self-talk.    Plan:  Patient today showing up to appointment very distressed and upset but did a good job of working through that and showed some good motivation and engagement in the session, even though she had had a very upsetting day on top of things she already had going on in her life that were distressing.  She did a lot of processing today as noted above and was much more grounded and calm upon leaving with, no SI.  Encouraged patient to use some behaviors that have been helpful previously including: Staying in the present and focused on what she can control right now, reach out to people in her support network,  interrupting and replacing anxious/negative/depressive/fearful thoughts to be more reality based and empowering thoughts that do not feed her anxiety, spend some time with her dog which is very therapeutic for her, getting outside some daily, continued work on adjusting more easily when things do not go as planned, stop self negating, try not to assume worst case scenarios, practice consistent positive self talk,  intentionally look for positives within herself, look for more positives and negatives daily, let go of negative thoughts and assumptions that hold her back, practice self forgiveness, and recognize the strength that she is showing as she works with goal-directed behaviors in the midst of challenging circumstances in trying to move forward in a more positive direction of improved emotional health.   Review and progress/challenges noted with patient.  Next appointment within 2 weeks.   Mathis Fare, LCSW

## 2021-03-08 ENCOUNTER — Ambulatory Visit
Admission: RE | Admit: 2021-03-08 | Discharge: 2021-03-08 | Disposition: A | Discharge status: Home / Self Care | Payer: 59 | Source: Ambulatory Visit | Attending: Emergency Medicine | Admitting: Emergency Medicine

## 2021-03-08 ENCOUNTER — Other Ambulatory Visit: Payer: Self-pay

## 2021-03-08 ENCOUNTER — Ambulatory Visit (INDEPENDENT_AMBULATORY_CARE_PROVIDER_SITE_OTHER): Payer: 59

## 2021-03-08 VITALS — BP 113/71 | HR 81 | Temp 97.8°F | Resp 18 | Ht 66.0 in | Wt 175.0 lb

## 2021-03-08 DIAGNOSIS — M25571 Pain in right ankle and joints of right foot: Secondary | ICD-10-CM

## 2021-03-08 MED ORDER — KETOROLAC TROMETHAMINE 30 MG/ML IJ SOLN
30.0000 mg | Freq: Once | INTRAMUSCULAR | Status: AC
  Administered 2021-03-08: 30 mg via INTRAMUSCULAR

## 2021-03-08 MED ORDER — NAPROXEN 500 MG PO TABS: 500.0000 mg | ORAL_TABLET | Freq: Two times a day (BID) | ORAL | 0 refills | Status: DC

## 2021-03-08 NOTE — ED Triage Notes (Signed)
Patient states that she fell and rolled her right ankle yesterday.  Patient is having some pain, difficulty walking on it.  Patient has been applying ice and elevating it.  Denies any pain meds.

## 2021-03-08 NOTE — ED Provider Notes (Signed)
[facility]    CSN: [id number] Arrival date & time: 03/08/21  1705      History   Chief Complaint Chief Complaint  Patient presents with   Appointment   Ankle Pain    HPI Mariah Young is a 25 y.o. female.    Patient presents with right ankle pain and swelling for 1 day after rolling ankle outwards while on a walk, then proceeded to complete a work shift which requires a lot of of standing and walking on foot.  Painful to walk on today.  Range of motion intact but does elicit pain with extension and rotation.  Denies numbness, tingling.  Has had prior fracture to the ankle.  Attempted use of over-the-counter Tylenol with no improvement.  Past Medical History:  Diagnosis Date   Acute non-recurrent maxillary sinusitis 07/12/2017   ADHD (attention deficit hyperactivity disorder)    Anxiety    Cough 07/12/2017   Diaphoresis    Dysmenorrhea    IUD (intrauterine device) in place    Obesity (BMI 30.0-34.9)    Obsessive compulsive disorder     Patient Active Problem List   Diagnosis Date Noted   Encounter to establish care 11/14/2020   Hematuria 11/14/2020   E-coli UTI 11/14/2020   Infectious mononucleosis without complication 11/14/2020   Vasovagal syncope 11/14/2020   Generalized anxiety disorder 06/14/2019   Obsessive compulsive disorder 06/14/2019   Panic disorder 06/14/2019   Attention deficit hyperactivity disorder (ADHD), combined type 12/07/2018   Anxiety 12/07/2018    Past Surgical History:  Procedure Laterality Date   BREAST LUMPECTOMY Right 2014   BREAST SURGERY     REDUCTION MAMMAPLASTY Bilateral 2013    OB History   No obstetric history on file.      Home Medications    Prior to Admission medications   Medication Sig Start Date End Date Taking? Authorizing Provider  ALPRAZolam Prudy Feeler) 1 MG tablet Take 1 tablet (1 mg total) by mouth 2 (two) times daily as needed for anxiety. 02/21/21  Yes Mozingo, Thereasa Solo, NP   amphetamine-dextroamphetamine (ADDERALL XR) 25 MG 24 hr capsule Take 2 capsules by mouth daily after breakfast. 02/21/21  Yes Mozingo, Thereasa Solo, NP  amphetamine-dextroamphetamine (ADDERALL XR) 25 MG 24 hr capsule Take two capsules by mouth daily after breakfast. 03/21/21  Yes Mozingo, Thereasa Solo, NP  amphetamine-dextroamphetamine (ADDERALL XR) 25 MG 24 hr capsule Take two capsules by mouth daily after breakfast. 04/18/21  Yes Mozingo, Thereasa Solo, NP  amphetamine-dextroamphetamine (ADDERALL) 20 MG tablet Take 1 tablet (20 mg total) by mouth daily before supper. 11/22/20  Yes Mozingo, Thereasa Solo, NP  amphetamine-dextroamphetamine (ADDERALL) 20 MG tablet Take 1 tablet (20 mg total) by mouth daily. 12/20/20  Yes Mozingo, Thereasa Solo, NP  amphetamine-dextroamphetamine (ADDERALL) 20 MG tablet Take 1 tablet (20 mg total) by mouth daily. 01/17/21  Yes Mozingo, Thereasa Solo, NP  desvenlafaxine (PRISTIQ) 50 MG 24 hr tablet Take 1 tablet (50 mg total) by mouth daily. 11/22/20  Yes Mozingo, Thereasa Solo, NP  Desvenlafaxine Succinate ER (PRISTIQ) 25 MG TB24 Take 25 mg by mouth daily. 11/22/20  Yes Mozingo, Thereasa Solo, NP  fluticasone (FLONASE) 50 MCG/ACT nasal spray Place 1-2 sprays into both nostrils daily. 02/22/21  Yes Wieters, Hallie C, PA-C  Multiple Vitamin (ONE-DAILY MULTI-VITAMIN) TABS Take 1 tablet by mouth daily.   Yes [provider]  naproxen (NAPROSYN) 500 MG tablet Take 1 tablet (500 mg total) by mouth 2 (two) times daily. 03/08/21  Yes Salli Quarry  R, NP  Cetirizine HCl 10 MG CAPS Take 1 capsule (10 mg total) by mouth daily for 10 days. 02/22/21 03/04/21  Wieters, Junius Creamer, PA-C    Family History Family History  Problem Relation Age of Onset   Hypertension Mother    Hypertension Father    Colon cancer Maternal Grandfather    Lung cancer Paternal Grandfather     Social History Social History   Tobacco Use   Smoking status: Never   Smokeless tobacco: Never   Vaping Use   Vaping Use: Never used  Substance Use Topics   Alcohol use: Yes   Drug use: Never     Allergies   Clarithromycin and Penicillins   Review of Systems Review of Systems  Constitutional: Negative.   Respiratory: Negative.    Cardiovascular: Negative.   Musculoskeletal:  Positive for joint swelling. Negative for arthralgias, back pain, gait problem, myalgias, neck pain and neck stiffness.  Skin: Negative.   Neurological: Negative.     Physical Exam Triage Vital Signs ED Triage Vitals  Enc Vitals Group     BP 03/08/21 1711 113/71     Pulse Rate 03/08/21 1711 81     Resp 03/08/21 1711 18     Temp 03/08/21 1711 97.8 F (36.6 C)     Temp Source 03/08/21 1711 Oral     SpO2 03/08/21 1711 98 %     Weight 03/08/21 1713 175 lb (79.4 kg)     Height 03/08/21 1713 5\' 6"  (1.676 m)     Head Circumference --      Peak Flow --      Pain Score 03/08/21 1713 7     Pain Loc --      Pain Edu? --      Excl. in GC? --    No data found.  Updated Vital Signs BP 113/71 (BP Location: Left Arm)   Pulse 81   Temp 97.8 F (36.6 C) (Oral)   Resp 18   Ht 5\' 6"  (1.676 m)   Wt 175 lb (79.4 kg)   SpO2 98%   BMI 28.25 kg/m   Visual Acuity Right Eye Distance:   Left Eye Distance:   Bilateral Distance:    Right Eye Near:   Left Eye Near:    Bilateral Near:     Physical Exam Constitutional:      Appearance: Normal appearance. She is normal weight.  HENT:     Head: Normocephalic.  Eyes:     Extraocular Movements: Extraocular movements intact.  Pulmonary:     Effort: Pulmonary effort is normal.  Musculoskeletal:     Comments: Diffuse tenderness over the lateral aspect of the ankle with point tenderness over the calcabeifibular ligament  Skin:    General: Skin is warm.  Neurological:     Mental Status: She is alert and oriented to person, place, and time. Mental status is at baseline.  Psychiatric:        Mood and Affect: Mood normal.        Behavior: Behavior  normal.     UC Treatments / Results  Labs (all labs ordered are listed, but only abnormal results are displayed) Labs Reviewed - No data to display  EKG   Radiology DG Ankle Complete Right  Result Date: 03/08/2021 CLINICAL DATA:  Rolled the ankle with lateral pain EXAM: RIGHT ANKLE - COMPLETE 3+ VIEW COMPARISON:  None. FINDINGS: There is no evidence of fracture, dislocation, or joint effusion. There is no evidence of  arthropathy or other focal bone abnormality. Soft tissues are unremarkable. IMPRESSION: Negative. Electronically Signed   By: Jasmine Pang M.D.   On: 03/08/2021 17:36    Procedures Procedures (including critical care time)  Medications Ordered in UC Medications  ketorolac (TORADOL) 30 MG/ML injection 30 mg (30 mg Intramuscular Given 03/08/21 1738)    Initial Impression / Assessment and Plan / UC Course  I have reviewed the triage vital signs and the nursing notes.  Pertinent labs & imaging results that were available during my care of the patient were reviewed by me and considered in my medical decision making (see chart for details).  Acute right ankle pain  1.  Ankle x-ray negative 2.  Naproxen 500 mg twice daily, advised consistent use for 5 days then as needed 3.  Compression wrap as needed for additional support 4.  Elevation of foot while in dependent positions 5.  Heat or ice per preference in 15 to 20 minutes to more affected area 6.  Urgent care orthopedic follow-up for persistent pain in 2 weeks Final Clinical Impressions(s) / UC Diagnoses   Final diagnoses:  Acute right ankle pain     Discharge Instructions      Your x-ray today was negative for any sign of break to the bone, tendons, ligaments  You may take naproxen twice a day to help with pain, I advised using the medication consistently for 5 days then as needed to help with the swelling and pain  You may use ice or heat in 15 to 20-minute intervals over affected area, whichever provides  you the most comfort  You may wear wrap to provide support as needed  You may elevate foot on 2 pillow or chair while in sitting position or lying position to help with swelling  If pain is persistent for 2 weeks please follow-up in urgent care or with orthopedic specialist, information listed below   ED Prescriptions     Medication Sig Dispense Auth. Provider   naproxen (NAPROSYN) 500 MG tablet Take 1 tablet (500 mg total) by mouth 2 (two) times daily. 30 tablet Valinda Hoar, NP      PDMP not reviewed this encounter.   Valinda Hoar, [place] 03/08/21 [id number]

## 2021-03-08 NOTE — Discharge Instructions (Addendum)
Your x-ray today was negative for any sign of break to the bone, tendons, ligaments  You may take naproxen twice a day to help with pain, I advised using the medication consistently for 5 days then as needed to help with the swelling and pain  You may use ice or heat in 15 to 20-minute intervals over affected area, whichever provides you the most comfort  You may wear wrap to provide support as needed  You may elevate foot on 2 pillow or chair while in sitting position or lying position to help with swelling  If pain is persistent for 2 weeks please follow-up in urgent care or with orthopedic specialist, information listed below

## 2021-03-15 ENCOUNTER — Other Ambulatory Visit: Payer: Self-pay

## 2021-03-15 ENCOUNTER — Ambulatory Visit (INDEPENDENT_AMBULATORY_CARE_PROVIDER_SITE_OTHER): Payer: 59 | Admitting: Psychiatry

## 2021-03-15 DIAGNOSIS — F411 Generalized anxiety disorder: Secondary | ICD-10-CM

## 2021-03-15 NOTE — Progress Notes (Signed)
[facility] Counselor/Therapist Progress Note  Patient ID: Mariah Young, MRN: [id number],    Date: 03/15/2021  Time Spent: 55 minutes   Treatment Type: Individual Therapy  Reported Symptoms: anxiety, depression, difficulty sleeping, stressed  Mental Status Exam:  Appearance:   Casual     Behavior:  Appropriate, Sharing, and Motivated  Motor:  Normal  Speech/Language:   Clear and Coherent  Affect:  Depressed and anxious  Mood:  anxious and depressed  Thought process:  goal directed  Thought content:    Some obsessiveness  Sensory/Perceptual disturbances:    WNL  Orientation:  oriented to person, place, time/date, situation, day of week, month of year, year, and stated date of Aug. 25, 2022  Attention:  Good  Concentration:  Fair  Memory:  WNL  Fund of knowledge:   Good  Insight:    Good and Fair  Judgment:   Good  Impulse Control:  Good and Fair   Risk Assessment: Danger to Self:  No Self-injurious Behavior: No Danger to Others: No Duty to Warn:no Physical Aggression / Violence:No  Access to Firearms a concern: No  Gang Involvement:No   Subjective: Patient in today reporting anxiety, overwhelmed, stress, depression, tearfulness, some irritability. Denies any SI. Tearful,Vented a lot of anxiety, depression, fears, concerns, frustrations, and anger which we used session to process and help patient reach a level of more calmness and some lessening of anxiety. Job situation has continued to worsen significantly and patient very upset over work issues, and her own personal challenges right now.  Currently anxiety rated as an 8/9 on a 1-10 scale.  Per treatment plan, worked with some of the thoughts and beliefs of patient that tend to feed her anxiety, fear, worry, and negativity which seemed to be helpful in the moment but she worries about it getting worse again.  States that she does not have any suicidal ideation and has not during these past couple of days when her  anxiety has been heightened.  Relationship issues with her BF seems to have leveled out some and are not as much of a stressor.  By session and, patient was some calmer and more grounded, with some intermittent tearfulness but much less than earlier in session.  She knows that she can contact me if needed between sessions and is scheduling with her med provider here at [facility] for an appt tomorrow as her next appointment was a couple months off.  She feels uncertain as to if she needs any different medication or any changes that might be needed due to her struggling more recently.  Interventions: Cognitive Behavioral Therapy, Solution-Oriented/Positive Psychology, and Ego-Supportive  Diagnosis:   ICD-10-CM   1. Generalized anxiety disorder  F41.1       Treatment goal plan :  Patient not signing tx plan on computer screen due to Covid. Treatment Goals: Goals will remain on tx plan as patient works with strategies to achieve her goals. Progress will be noted each session and documented in "Progress" section of goal plan. Long term goal: (Measurable) 1. Stabilize anxiety level while increasing ability to function on a daily basis.  2. Patient will eventually progress to where she rates her anxiety as a "3" or less on "1-10 Anxiety scale" for at least 2 months. Short term goal: Increase understanding of beliefs and messages that produce anxiety, worry,fear, and negativity.  Strategies: Identify, challenge, and replace anxious/fearful/negative with positive, hopeful, and empoweriing self-talk.      Plan: Patient today showing  marginal motivation initially but her motivation did improve some during session.  As noted above, she was able to vent and process freely a wide range of feelings, thoughts, and emotions and seemed to feel calmer and more grounded by session end.  Her BF is currently staying with her and she has a couple more days off from work, and has not decided if she feels like she  can work next week or not.  There were some things going on at work that are important, and also some things outside of work.  (Not all details included in this note due to patient privacy needs).  Encouraged patient to practice some behaviors that have been helpful previously including: Staying in the present and focusing on what she can control right now, reaching out to people in her support network, getting outside some daily, good nutrition and exercise as she is able, interrupting and challenging and replacing anxious/negative/depressive/fearful thoughts to be more reality based and empowering thoughts that do not feed her anxiety, spend some time with her dog which is very therapeutic for her, continued work on adjusting more easily when things do not go as planned, stop self negating, stop imagining worst case scenarios, practice consistent positive self talk, intentionally look for positives within herself, look for more positives versus negatives daily, let go of negative thoughts and assumptions that hold her back, practice self forgiveness, practice forgiving others, and recognize the strength that she is showing as she works with goal-directed behaviors in the midst of challenging circumstances in trying to move forward in a more positive direction of improved emotional health.  Goal review and progress/challenges noted with patient.  Next appointment within 2 weeks.   Mathis Fare, LCSW

## 2021-03-16 ENCOUNTER — Ambulatory Visit (INDEPENDENT_AMBULATORY_CARE_PROVIDER_SITE_OTHER): Payer: 59 | Admitting: Adult Health

## 2021-03-16 ENCOUNTER — Encounter: Payer: Self-pay | Admitting: Adult Health

## 2021-03-16 DIAGNOSIS — F411 Generalized anxiety disorder: Secondary | ICD-10-CM

## 2021-03-16 DIAGNOSIS — F410 Panic disorder [episodic paroxysmal anxiety] without agoraphobia: Secondary | ICD-10-CM

## 2021-03-16 DIAGNOSIS — F422 Mixed obsessional thoughts and acts: Secondary | ICD-10-CM | POA: Diagnosis not present

## 2021-03-16 DIAGNOSIS — F902 Attention-deficit hyperactivity disorder, combined type: Secondary | ICD-10-CM | POA: Diagnosis not present

## 2021-03-16 NOTE — Progress Notes (Signed)
Mariah Young [id number] [birth year] 25 y.o.  Subjective:   Patient ID:  Mariah Young is a 25 y.o. (DOB [birth year]) female.  Chief Complaint: No chief complaint on file.   HPI Mariah Young presents to the office today for follow-up of ADHD, panic disorder, GAD, and obsessional thoughts.   Describes mood today as "not good". Flat. Tearful "all the time" - throughout interview. Mood symptoms - reports increased depression, anxiety, and irritability. Stating "I feel so overwhelmed right now". Recently seen in the ED for mood decompensation - "overwhelming panic and anxiety". Was evaluated and released. All physical and laboratory findings were within normal limits. Reports being under a lot of stress in work setting. Is concerned that her declining mood is effecting her relationship. Medications continue to be helpful, but the additional stressors are keeping her overwhelmed and unable to make decisions. Seeing Rockne Menghini for therapy. Decreased  interest and motivation. Taking medications as prescribed.  Energy levels lower. Active, does not have a regular exercise routine. Enjoys some usual interests and activities. Single. Has a boyfriend. Lives alone with dog. Family in [place]. Appetite adequate. Weight gain 5 pounds - 175 pounds. Sleeping better some nights than others. Averages 3 to 5 hours.  Focus and concentration stable. Completing tasks. Managing aspects of household. Works full-time - 14 to 16 hours a day. Looking for  new job - feeling "burnt out". Denies SI or HI.  Denies AH or VH.  Previous medication trials: Prozac   GAD-7    Flowsheet Row Office Visit from 11/14/2020 in [facility]  Total GAD-7 Score 11      PHQ2-9    Flowsheet Row Office Visit from 11/14/2020 in [facility]  PHQ-2 Total Score 3  PHQ-9 Total Score 14      Flowsheet Row ED from 03/08/2021 in [facility]  ED from 02/22/2021 in [facility] ED from 11/02/2020 in [facility]  C-SSRS RISK CATEGORY No Risk No Risk No Risk        Review of Systems:  Review of Systems  Musculoskeletal:  Negative for gait problem.  Neurological:  Negative for tremors.  Psychiatric/Behavioral:         Please refer to HPI   Medications: I have reviewed the patient's current medications.  Current Outpatient Medications  Medication Sig Dispense Refill   ALPRAZolam (XANAX) 1 MG tablet Take 1 tablet (1 mg total) by mouth 2 (two) times daily as needed for anxiety. 60 tablet 2   amphetamine-dextroamphetamine (ADDERALL XR) 25 MG 24 hr capsule Take 2 capsules by mouth daily after breakfast. 60 capsule 0   [START ON 03/21/2021] amphetamine-dextroamphetamine (ADDERALL XR) 25 MG 24 hr capsule Take two capsules by mouth daily after breakfast. 60 capsule 0   [START ON 04/18/2021] amphetamine-dextroamphetamine (ADDERALL XR) 25 MG 24 hr capsule Take two capsules by mouth daily after breakfast. 60 capsule 0   amphetamine-dextroamphetamine (ADDERALL) 20 MG tablet Take 1 tablet (20 mg total) by mouth daily before supper. 30 tablet 0   amphetamine-dextroamphetamine (ADDERALL) 20 MG tablet Take 1 tablet (20 mg total) by mouth daily. 30 tablet 0   amphetamine-dextroamphetamine (ADDERALL) 20 MG tablet Take 1 tablet (20 mg total) by mouth daily. 30 tablet 0   Cetirizine HCl 10 MG CAPS Take 1 capsule (10 mg total) by mouth daily for 10 days. 10 capsule 0   desvenlafaxine (PRISTIQ) 50  MG 24 hr tablet Take 1 tablet (50 mg total) by mouth daily. 30 tablet 5   Desvenlafaxine Succinate ER (PRISTIQ) 25 MG TB24 Take 25 mg by mouth daily. 30 tablet 5   fluticasone (FLONASE) 50 MCG/ACT nasal spray Place 1-2 sprays into both nostrils daily. 16 g 0   Multiple Vitamin (ONE-DAILY MULTI-VITAMIN) TABS Take 1 tablet by mouth daily.     naproxen (NAPROSYN) 500 MG  tablet Take 1 tablet (500 mg total) by mouth 2 (two) times daily. 30 tablet 0   No current facility-administered medications for this visit.    Medication Side Effects: None  Allergies:  Allergies  Allergen Reactions   Clarithromycin Other (See Comments) and Rash    Vasculitis Vasculitis    Penicillins Other (See Comments)    Other reaction(s): Intolerance vasculitis vasculitis     Past Medical History:  Diagnosis Date   Acute non-recurrent maxillary sinusitis 07/12/2017   ADHD (attention deficit hyperactivity disorder)    Anxiety    Cough 07/12/2017   Diaphoresis    Dysmenorrhea    IUD (intrauterine device) in place    Obesity (BMI 30.0-34.9)    Obsessive compulsive disorder     Past Medical History, Surgical history, Social history, and Family history were reviewed and updated as appropriate.   Please see review of systems for further details on the patient's review from today.   Objective:   Physical Exam:  There were no vitals taken for this visit.  Physical Exam Constitutional:      General: She is not in acute distress. Musculoskeletal:        General: No deformity.  Neurological:     Mental Status: She is alert and oriented to person, place, and time.     Coordination: Coordination normal.  Psychiatric:        Attention and Perception: Attention and perception normal. She does not perceive auditory or visual hallucinations.        Mood and Affect: Mood normal. Mood is not anxious or depressed. Affect is not labile, blunt, angry or inappropriate.        Speech: Speech normal.        Behavior: Behavior normal.        Thought Content: Thought content normal. Thought content is not paranoid or delusional. Thought content does not include homicidal or suicidal ideation. Thought content does not include homicidal or suicidal plan.        Cognition and Memory: Cognition and memory normal.        Judgment: Judgment normal.     Comments: Insight intact     Lab Review:     Component Value Date/Time   NA 137 11/14/2020 1007   K 3.8 11/14/2020 1007   CL 101 11/14/2020 1007   CO2 27 11/14/2020 1007   GLUCOSE 95 11/14/2020 1007   BUN 14 11/14/2020 1007   CREATININE 0.50 11/14/2020 1007   CALCIUM 9.3 11/14/2020 1007   PROT 7.4 11/14/2020 1007   ALBUMIN 4.4 11/14/2020 1007   AST 16 11/14/2020 1007   ALT 19 11/14/2020 1007   ALKPHOS 44 11/14/2020 1007   BILITOT 0.5 11/14/2020 1007   GFRNONAA >60 11/14/2020 1007       Component Value Date/Time   WBC 10.5 11/02/2020 0828   RBC 4.28 11/02/2020 0828   HGB 13.0 11/02/2020 0828   HCT 38.1 11/02/2020 0828   PLT 256 11/02/2020 0828   MCV 89.0 11/02/2020 0828   MCH 30.4 11/02/2020 0828   MCHC  34.1 11/02/2020 0828   RDW 12.6 11/02/2020 0828   LYMPHSABS 3.5 11/02/2020 0828   MONOABS 0.6 11/02/2020 0828   EOSABS 0.1 11/02/2020 0828   BASOSABS 0.0 11/02/2020 0828    No results found for: POCLITH, LITHIUM   No results found for: PHENYTOIN, PHENOBARB, VALPROATE, CBMZ   .res Assessment: Plan:    Plan:  PDMP reviewed  Continue:  1. Adderall XR 25mg  2. Adderall 20mg  daily 3. Xanax 1mg  BID 4. Pristiq 50mg  daily 5. Pristiq 25mg  daily  Patient is totally disabled and unable to perform job duties at this time. FMLA was recommended for the next 30 days - 03/19/2021 through 04/18/2021. Patient to provide FMLA paperwork for completion and notify her HR department.   RTC 4 weeks   Patient advised to contact office with any questions, adverse effects, or acute worsening in signs and symptoms.  Discussed potential benefits, risk, and side effects of benzodiazepines to include potential risk of tolerance and dependence, as well as possible drowsiness.  Advised patient not to drive if experiencing drowsiness and to take lowest possible effective dose to minimize risk of dependence and tolerance.  Discussed potential benefits, risks, and side effects of stimulants with patient to include  increased heart rate, palpitations, insomnia, increased anxiety, increased irritability, or decreased appetite.  Instructed patient to contact office if experiencing any significant tolerability issues.  Diagnoses and all orders for this visit:  Generalized anxiety disorder  Attention deficit hyperactivity disorder (ADHD), combined type, moderate  Mixed obsessional thoughts and acts  Panic disorder    Please see After Visit Summary for patient specific instructions.  Future Appointments  Date Time Provider Department Center  03/21/2021  5:00 PM , LCSW [facility] None  03/30/2021 12:00 PM Alaiyah Bollman, 03/21/2021, NP [facility] None  04/04/2021  5:00 PM 03/23/2021, LCSW [facility] None  04/18/2021  5:00 PM 05/30/2021, LCSW [facility] None  05/02/2021  5:00 PM 04/06/2021, LCSW CP-[facility] None  05/16/2021  5:00 PM 04/20/2021, LCSW CP-CP None  05/24/2021  8:20 AM Sedalia Greeson, 07/02/2021, NP CP-[facility] None  05/30/2021  5:00 PM 05/18/2021, LCSW CP-CP None    No orders of the defined types were placed in this encounter.   -------------------------------

## 2021-03-19 ENCOUNTER — Telehealth: Payer: Self-pay | Admitting: Adult Health

## 2021-03-19 NOTE — Telephone Encounter (Signed)
Received STD form. Given to [facility] 8/29

## 2021-03-20 NOTE — Telephone Encounter (Signed)
This form was dropped off yesterday 8/29, it is not ready. We can not return a form in fact she has 2 forms the next day. We have several ahead of her I will complete it as soon as I can.

## 2021-03-20 NOTE — Telephone Encounter (Signed)
Pt called asking to talk to Provider about STD form. Return call @ [phone]. Apt 9/9 w/ RM

## 2021-03-21 ENCOUNTER — Ambulatory Visit (INDEPENDENT_AMBULATORY_CARE_PROVIDER_SITE_OTHER): Payer: 59 | Admitting: Psychiatry

## 2021-03-21 ENCOUNTER — Other Ambulatory Visit: Payer: Self-pay

## 2021-03-21 DIAGNOSIS — F411 Generalized anxiety disorder: Secondary | ICD-10-CM

## 2021-03-21 NOTE — Progress Notes (Signed)
[facility] Counselor/Therapist Progress Note  Patient ID: Mariah Young, MRN: [id number],    Date: 03/21/2021  Time Spent: 58 minutes  Treatment Type: Individual Therapy  Reported Symptoms: anxiety, depression  Mental Status Exam:  Appearance:   Casual     Behavior:  Appropriate, Sharing, and Motivated  Motor:  Normal  Speech/Language:   Clear and Coherent  Affect:  Depressed and anxious  Mood:  anxious and depressed  Thought process:  goal directed  Thought content:    obsessivness  Sensory/Perceptual disturbances:    WNL  Orientation:  oriented to person, place, time/date, situation, day of week, month of year, year, and stated date of Aug. 2022  Attention:  Good  Concentration:  Good and Fair  Memory:  WNL  Fund of knowledge:   Good  Insight:    Good  Judgment:   Good  Impulse Control:  Good   Risk Assessment: Danger to Self:  No Self-injurious Behavior: No Danger to Others: No Duty to Warn:no Physical Aggression / Violence:No  Access to Firearms a concern: No  Gang Involvement:No   Subjective: Patient in today tearfully processing her feelings of anxiety, overwhelmed, and depressed.  To be out from work on medical leave for emotional reasons. Is to have job interview by phone tomorrow for different company. Discussed stressors personally and at work and needing lots of support and encouragement in accepting herself at this time of needing to step back from work and taking a break for herself.  Does state her anxiety is somewhat less than her last time in the office here.  She is to work with goal directed behaviors at home as well as here in the office especially in stabilizing her anxiety level and understanding the messages that tend to produce the most anxiety for her.  Interventions: Cognitive Behavioral Therapy, Solution-Oriented/Positive Psychology, and Ego-Supportive  Diagnosis:   ICD-10-CM   1. Generalized anxiety disorder  F41.1          Treatment goal plan :  Patient not signing tx plan on computer screen due to Covid. Treatment Goals: Goals will remain on tx plan as patient works with strategies to achieve her goals. Progress will be noted each session and documented in "Progress" section of goal plan. Long term goal: (Measurable) 1. Stabilize anxiety level while increasing ability to function on a daily basis.  2. Patient will eventually progress to where she rates her anxiety as a "3" or less on "1-10 Anxiety scale" for at least 2 months. Short term goal: Increase understanding of beliefs and messages that produce anxiety, worry,fear, and negativity.  Strategies: Identify, challenge, and replace anxious/fearful/negative with positive, hopeful, and empoweriing self-talk.   Plan:  Patient in today showing some motivation which did improve over the session.  Worked with her fears/anxieties regarding her relationship with BF and upcoming job interview.  Currently on leave from work and having difficult time managing her stress and worrying a lot, mostly about personal issues, current relationship, and other job possibilities.  She did calm down considerably while in office today and reported feeling more grounded.  Encouraged patient to practice positive behaviors including: Getting outside daily and walking, good nutrition and exercise as she is able, staying in the present and focusing on what she can control right now, reaching out to people in her support network, interrupting and challenging and replacing anxious/negative/depressive/fearful thoughts to be more reality based and empowering thoughts that do not feed her anxiety nor depression, spending time  with her dog which is very therapeutic for her, continue work on adjusting more easily when things do not go as planned, stop self negating, stop imagining worst case scenarios, practice consistent positive self talk, let go of negative thoughts and assumptions that hold her  back, intentionally look for positives within herself, look for more positives versus negatives daily, practice self forgiveness, practice forgiving others, and recognize the strength that she is showing as she works with goal-directed behaviors in the midst of challenging circumstances in trying to move forward with a more positive direction of improved emotional health.  Goal review and progress/challenges noted with patient.  Next appointment within 2 weeks.   Mathis Fare, LCSW

## 2021-03-27 DIAGNOSIS — Z0289 Encounter for other administrative examinations: Secondary | ICD-10-CM

## 2021-03-27 NOTE — Telephone Encounter (Signed)
All forms completed and sent to [facility] to review and sign. Pt has upcoming apt on 9/09

## 2021-03-30 ENCOUNTER — Other Ambulatory Visit: Payer: Self-pay

## 2021-03-30 ENCOUNTER — Ambulatory Visit (INDEPENDENT_AMBULATORY_CARE_PROVIDER_SITE_OTHER): Payer: 59 | Admitting: Adult Health

## 2021-03-30 ENCOUNTER — Encounter: Payer: Self-pay | Admitting: Adult Health

## 2021-03-30 DIAGNOSIS — F422 Mixed obsessional thoughts and acts: Secondary | ICD-10-CM | POA: Diagnosis not present

## 2021-03-30 DIAGNOSIS — F411 Generalized anxiety disorder: Secondary | ICD-10-CM | POA: Diagnosis not present

## 2021-03-30 DIAGNOSIS — F902 Attention-deficit hyperactivity disorder, combined type: Secondary | ICD-10-CM | POA: Diagnosis not present

## 2021-03-30 DIAGNOSIS — F410 Panic disorder [episodic paroxysmal anxiety] without agoraphobia: Secondary | ICD-10-CM

## 2021-03-30 NOTE — Progress Notes (Signed)
Mariah Young [id number] July 22, 1996 25 y.o.  Subjective:   Patient ID:  Mariah Young is a 25 y.o. (DOB 06/21/1996) female.  Chief Complaint: No chief complaint on file.   HPI Mariah Young presents to the office today for follow-up of ADHD, panic disorder, GAD, and obsessional thoughts.   Describes mood today as "a little better". Flat. Decreased tearfulness. Mood symptoms - reports depression, anxiety, and irritability - "not as debilitating". Decreased panic attacks. Not feeling like she is going to "break down" all the time. Stating "it's been a while since I've had any peace". Trying to take better care of herself - "something I haven't done for the past 2 years". Dealing with some "guilt" about needing time off. Partner remains supportive. Varying interest and motivation. Working with therapist - Rockne Menghini. Taking medications as prescribed. Energy levels improving. Active, does not have a regular exercise routine. Enjoys some usual interests and activities. In a relationship. Lives alone with dog. Family in [place]. Appetite adequate. Weight stable - eating healthier again.  Sleep is improving. Averages 6 to 8 hours.  Focus and concentration stable. Completing tasks. Managing aspects of household. Out of work on STD. Denies SI or HI.  Denies AH or VH.  Previous medication trials: Prozac    GAD-7    Flowsheet Row Office Visit from 11/14/2020 in [facility]  Total GAD-7 Score 11      PHQ2-9    Flowsheet Row Office Visit from 11/14/2020 in [facility]  PHQ-2 Total Score 3  PHQ-9 Total Score 14      Flowsheet Row ED from 03/08/2021 in [facility]  ED from 02/22/2021 in [facility] from 11/02/2020 in [facility]  C-SSRS RISK CATEGORY No Risk No Risk No Risk        Review of  Systems:  Review of Systems  Musculoskeletal:  Negative for gait problem.  Neurological:  Negative for tremors.  Psychiatric/Behavioral:         Please refer to HPI   Medications: I have reviewed the patient's current medications.  Current Outpatient Medications  Medication Sig Dispense Refill   ALPRAZolam (XANAX) 1 MG tablet Take 1 tablet (1 mg total) by mouth 2 (two) times daily as needed for anxiety. 60 tablet 2   amphetamine-dextroamphetamine (ADDERALL XR) 25 MG 24 hr capsule Take 2 capsules by mouth daily after breakfast. 60 capsule 0   amphetamine-dextroamphetamine (ADDERALL XR) 25 MG 24 hr capsule Take two capsules by mouth daily after breakfast. 60 capsule 0   [START ON 04/18/2021] amphetamine-dextroamphetamine (ADDERALL XR) 25 MG 24 hr capsule Take two capsules by mouth daily after breakfast. 60 capsule 0   amphetamine-dextroamphetamine (ADDERALL) 20 MG tablet Take 1 tablet (20 mg total) by mouth daily before supper. 30 tablet 0   amphetamine-dextroamphetamine (ADDERALL) 20 MG tablet Take 1 tablet (20 mg total) by mouth daily. 30 tablet 0   amphetamine-dextroamphetamine (ADDERALL) 20 MG tablet Take 1 tablet (20 mg total) by mouth daily. 30 tablet 0   Cetirizine HCl 10 MG CAPS Take 1 capsule (10 mg total) by mouth daily for 10 days. 10 capsule 0   desvenlafaxine (PRISTIQ) 50 MG 24 hr tablet Take 1 tablet (50 mg total) by mouth daily. 30 tablet 5   Desvenlafaxine Succinate ER (PRISTIQ) 25 MG TB24 Take 25 mg by mouth daily. 30 tablet 5   fluticasone (FLONASE) 50  MCG/ACT nasal spray Place 1-2 sprays into both nostrils daily. 16 g 0   Multiple Vitamin (ONE-DAILY MULTI-VITAMIN) TABS Take 1 tablet by mouth daily.     naproxen (NAPROSYN) 500 MG tablet Take 1 tablet (500 mg total) by mouth 2 (two) times daily. 30 tablet 0   No current facility-administered medications for this visit.    Medication Side Effects: None  Allergies:  Allergies  Allergen Reactions   Clarithromycin Other  (See Comments) and Rash    Vasculitis Vasculitis    Penicillins Other (See Comments)    Other reaction(s): Intolerance vasculitis vasculitis     Past Medical History:  Diagnosis Date   Acute non-recurrent maxillary sinusitis 07/12/2017   ADHD (attention deficit hyperactivity disorder)    Anxiety    Cough 07/12/2017   Diaphoresis    Dysmenorrhea    IUD (intrauterine device) in place    Obesity (BMI 30.0-34.9)    Obsessive compulsive disorder     Past Medical History, Surgical history, Social history, and Family history were reviewed and updated as appropriate.   Please see review of systems for further details on the patient's review from today.   Objective:   Physical Exam:  There were no vitals taken for this visit.  Physical Exam Constitutional:      General: She is not in acute distress. Musculoskeletal:        General: No deformity.  Neurological:     Mental Status: She is alert and oriented to person, place, and time.     Coordination: Coordination normal.  Psychiatric:        Attention and Perception: Attention and perception normal. She does not perceive auditory or visual hallucinations.        Mood and Affect: Mood normal. Mood is not anxious or depressed. Affect is not labile, blunt, angry or inappropriate.        Speech: Speech normal.        Behavior: Behavior normal.        Thought Content: Thought content normal. Thought content is not paranoid or delusional. Thought content does not include homicidal or suicidal ideation. Thought content does not include homicidal or suicidal plan.        Cognition and Memory: Cognition and memory normal.        Judgment: Judgment normal.     Comments: Insight intact    Lab Review:     Component Value Date/Time   NA 137 11/14/2020 1007   K 3.8 11/14/2020 1007   CL 101 11/14/2020 1007   CO2 27 11/14/2020 1007   GLUCOSE 95 11/14/2020 1007   BUN 14 11/14/2020 1007   CREATININE 0.50 11/14/2020 1007   CALCIUM  9.3 11/14/2020 1007   PROT 7.4 11/14/2020 1007   ALBUMIN 4.4 11/14/2020 1007   AST 16 11/14/2020 1007   ALT 19 11/14/2020 1007   ALKPHOS 44 11/14/2020 1007   BILITOT 0.5 11/14/2020 1007   GFRNONAA >60 11/14/2020 1007       Component Value Date/Time   WBC 10.5 11/02/2020 0828   RBC 4.28 11/02/2020 0828   HGB 13.0 11/02/2020 0828   HCT 38.1 11/02/2020 0828   PLT 256 11/02/2020 0828   MCV 89.0 11/02/2020 0828   MCH 30.4 11/02/2020 0828   MCHC 34.1 11/02/2020 0828   RDW 12.6 11/02/2020 0828   LYMPHSABS 3.5 11/02/2020 0828   MONOABS 0.6 11/02/2020 0828   EOSABS 0.1 11/02/2020 0828   BASOSABS 0.0 11/02/2020 0828    No results found  for: POCLITH, LITHIUM   No results found for: PHENYTOIN, PHENOBARB, VALPROATE, CBMZ   .res Assessment: Plan:    Plan:  PDMP reviewed  Continue:  1. Adderall XR 25mg  2. Adderall 20mg  daily 3. Xanax 1mg  BID 4. Pristiq 50mg  daily 5. Pristiq 25mg  daily  Patient is totally disabled and unable to perform job duties at this time. FMLA was recommended for the next 30 days - 03/19/2021 through 04/16/2021. Patient to provide FMLA paperwork for completion and notify her HR department.   RTC 4 weeks   Patient advised to contact office with any questions, adverse effects, or acute worsening in signs and symptoms.  Discussed potential benefits, risk, and side effects of benzodiazepines to include potential risk of tolerance and dependence, as well as possible drowsiness.  Advised patient not to drive if experiencing drowsiness and to take lowest possible effective dose to minimize risk of dependence and tolerance.  Discussed potential benefits, risks, and side effects of stimulants with patient to include increased heart rate, palpitations, insomnia, increased anxiety, increased irritability, or decreased appetite.  Instructed patient to contact office if experiencing any significant tolerability issues.  Diagnoses and all orders for this  visit:  Generalized anxiety disorder  Attention deficit hyperactivity disorder (ADHD), combined type, moderate  Mixed obsessional thoughts and acts  Panic disorder    Please see After Visit Summary for patient specific instructions.  Future Appointments  Date Time Provider Department Center  04/04/2021  5:00 PM , LCSW CP-CP None  04/18/2021  5:00 PM 03/21/2021, LCSW CP-CP None  05/02/2021  5:00 PM 04/06/2021, LCSW CP-CP None  05/16/2021  5:00 PM 04/20/2021, LCSW CP-CP None  05/24/2021  8:20 AM Myrtie Leuthold, 07/02/2021, NP CP-CP None  05/30/2021  5:00 PM 05/18/2021, LCSW CP-CP None  06/13/2021  5:00 PM 13/09/2020, LCSW CP-CP None    No orders of the defined types were placed in this encounter.   -------------------------------

## 2021-04-04 ENCOUNTER — Other Ambulatory Visit: Payer: Self-pay

## 2021-04-04 ENCOUNTER — Ambulatory Visit (INDEPENDENT_AMBULATORY_CARE_PROVIDER_SITE_OTHER): Payer: 59 | Admitting: Psychiatry

## 2021-04-04 DIAGNOSIS — F411 Generalized anxiety disorder: Secondary | ICD-10-CM | POA: Diagnosis not present
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
## 2021-04-06 ENCOUNTER — Other Ambulatory Visit: Payer: Self-pay

## 2021-04-06 ENCOUNTER — Ambulatory Visit (INDEPENDENT_AMBULATORY_CARE_PROVIDER_SITE_OTHER): Payer: 59 | Admitting: Nurse Practitioner

## 2021-04-06 ENCOUNTER — Encounter (HOSPITAL_BASED_OUTPATIENT_CLINIC_OR_DEPARTMENT_OTHER): Payer: Self-pay | Admitting: Nurse Practitioner

## 2021-04-06 VITALS — BP 125/86 | HR 88 | Ht 66.0 in | Wt 175.0 lb

## 2021-04-06 DIAGNOSIS — B079 Viral wart, unspecified: Secondary | ICD-10-CM

## 2021-04-06 NOTE — Patient Instructions (Signed)
Today we performed wart removal with paring (shaving) and cryotherapy (liquid nitrogen). You should leave the bandage on for the rest of the day- replace if it gets wet.  Starting tomorrow, you can remove the bandage or lightly apply a new bandaid to help protect the area. The area will take time to heal, but you will notice over the next week you may see a blister or scab form over the area- this is normal and expected. Do not pick or peel the blister or scab. This will fall off naturally when the new skin under has healed.  Be sure to keep the area clean and dry. Do not scrub the area and do not apply lotion, ointments, or creams to the area.  Watch the area closely for signs of infection, which can include increasing redness, warmth, significant pain, swelling, fever, chills, body aches. If this happens, notify the office immediately.   In some cases a second treatment is necessary for large and stubborn warts. If this is the case, please call us and we can set up an appointment to perform the treatment again in 4-6 weeks.
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
## 2021-04-12 ENCOUNTER — Encounter: Payer: Self-pay | Admitting: Adult Health

## 2021-04-12 ENCOUNTER — Ambulatory Visit (INDEPENDENT_AMBULATORY_CARE_PROVIDER_SITE_OTHER): Payer: 59 | Admitting: Adult Health

## 2021-04-12 ENCOUNTER — Other Ambulatory Visit: Payer: Self-pay

## 2021-04-12 DIAGNOSIS — F411 Generalized anxiety disorder: Secondary | ICD-10-CM | POA: Diagnosis not present

## 2021-04-12 DIAGNOSIS — F902 Attention-deficit hyperactivity disorder, combined type: Secondary | ICD-10-CM

## 2021-04-12 DIAGNOSIS — F410 Panic disorder [episodic paroxysmal anxiety] without agoraphobia: Secondary | ICD-10-CM

## 2021-04-12 DIAGNOSIS — F422 Mixed obsessional thoughts and acts: Secondary | ICD-10-CM

## 2021-04-12 NOTE — Progress Notes (Signed)
Mariah Young [id number] 08-07-1995 25 y.o.  Subjective:   Patient ID:  Mariah Young is a 25 y.o. (DOB December 26, 1995) female.  Chief Complaint: No chief complaint on file.   HPI Mariah Young presents to the office today for follow-up of ADHD, panic disorder, GAD, and obsessional thoughts.   Describes mood today as "better". Flat. Decreased tearfulness. Mood symptoms - reports slight decrease in depression, anxiety, and irritability. Decreased panic attacks. Stating "I feel better". Has been able to focus on herself  - health over the past few weeks. Plans to return to work setting on 04/17/2021. She and partner doing well. Improved interest and motivation. Working with therapist - Rockne Menghini. Taking medications as prescribed. Energy levels improving. Active, does not have a regular exercise routine. Has been walking more. Enjoys some usual interests and activities. In a relationship. Lives alone with dog. Family in [place]. Appetite adequate. Weight loss - "a few pounds" - eating healthier again.  Sleep is improving. Averages 6 to 8 hours.  Focus and concentration stable. Completing tasks. Managing aspects of household. Out of work on STD - returns 04/17/2021. Denies SI or HI.  Denies AH or VH.  Previous medication trials: Prozac    GAD-7    Flowsheet Row Office Visit from 11/14/2020 in [facility]  Total GAD-7 Score 11      PHQ2-9    Flowsheet Row Office Visit from 11/14/2020 in [facility]  PHQ-2 Total Score 3  PHQ-9 Total Score 14      Flowsheet Row ED from 03/08/2021 in [facility]  ED from 02/22/2021 in [facility] at [facility] ED from 11/02/2020 in [facility]  C-SSRS RISK CATEGORY No Risk No Risk No Risk        Review of Systems:  Review of Systems  Musculoskeletal:  Negative for gait  problem.  Neurological:  Negative for tremors.  Psychiatric/Behavioral:         Please refer to HPI   Medications: I have reviewed the patient's current medications.  Current Outpatient Medications  Medication Sig Dispense Refill   ALPRAZolam (XANAX) 1 MG tablet Take 1 tablet (1 mg total) by mouth 2 (two) times daily as needed for anxiety. 60 tablet 2   amphetamine-dextroamphetamine (ADDERALL XR) 25 MG 24 hr capsule Take 2 capsules by mouth daily after breakfast. 60 capsule 0   amphetamine-dextroamphetamine (ADDERALL XR) 25 MG 24 hr capsule Take two capsules by mouth daily after breakfast. 60 capsule 0   [START ON 04/18/2021] amphetamine-dextroamphetamine (ADDERALL XR) 25 MG 24 hr capsule Take two capsules by mouth daily after breakfast. 60 capsule 0   amphetamine-dextroamphetamine (ADDERALL) 20 MG tablet Take 1 tablet (20 mg total) by mouth daily before supper. 30 tablet 0   amphetamine-dextroamphetamine (ADDERALL) 20 MG tablet Take 1 tablet (20 mg total) by mouth daily. 30 tablet 0   amphetamine-dextroamphetamine (ADDERALL) 20 MG tablet Take 1 tablet (20 mg total) by mouth daily. 30 tablet 0   Cetirizine HCl 10 MG CAPS Take 1 capsule (10 mg total) by mouth daily for 10 days. 10 capsule 0   desvenlafaxine (PRISTIQ) 50 MG 24 hr tablet Take 1 tablet (50 mg total) by mouth daily. 30 tablet 5   Desvenlafaxine Succinate ER (PRISTIQ) 25 MG TB24 Take 25 mg by mouth daily. 30 tablet 5   Multiple Vitamin (ONE-DAILY MULTI-VITAMIN) TABS Take 1 tablet by mouth daily.  No current facility-administered medications for this visit.    Medication Side Effects: None  Allergies:  Allergies  Allergen Reactions   Clarithromycin Other (See Comments) and Rash    Vasculitis Vasculitis    Penicillins Other (See Comments)    Other reaction(s): Intolerance vasculitis vasculitis     Past Medical History:  Diagnosis Date   Acute non-recurrent maxillary sinusitis 07/12/2017   ADHD (attention deficit  hyperactivity disorder)    Anxiety    Cough 07/12/2017   Diaphoresis    Dysmenorrhea    IUD (intrauterine device) in place    Obesity (BMI 30.0-34.9)    Obsessive compulsive disorder     Past Medical History, Surgical history, Social history, and Family history were reviewed and updated as appropriate.   Please see review of systems for further details on the patient's review from today.   Objective:   Physical Exam:  There were no vitals taken for this visit.  Physical Exam Constitutional:      General: She is not in acute distress. Musculoskeletal:        General: No deformity.  Neurological:     Mental Status: She is alert and oriented to person, place, and time.     Coordination: Coordination normal.  Psychiatric:        Attention and Perception: Attention and perception normal. She does not perceive auditory or visual hallucinations.        Mood and Affect: Mood normal. Mood is not anxious or depressed. Affect is not labile, blunt, angry or inappropriate.        Speech: Speech normal.        Behavior: Behavior normal.        Thought Content: Thought content normal. Thought content is not paranoid or delusional. Thought content does not include homicidal or suicidal ideation. Thought content does not include homicidal or suicidal plan.        Cognition and Memory: Cognition and memory normal.        Judgment: Judgment normal.     Comments: Insight intact    Lab Review:     Component Value Date/Time   NA 137 11/14/2020 1007   K 3.8 11/14/2020 1007   CL 101 11/14/2020 1007   CO2 27 11/14/2020 1007   GLUCOSE 95 11/14/2020 1007   BUN 14 11/14/2020 1007   CREATININE 0.50 11/14/2020 1007   CALCIUM 9.3 11/14/2020 1007   PROT 7.4 11/14/2020 1007   ALBUMIN 4.4 11/14/2020 1007   AST 16 11/14/2020 1007   ALT 19 11/14/2020 1007   ALKPHOS 44 11/14/2020 1007   BILITOT 0.5 11/14/2020 1007   GFRNONAA >60 11/14/2020 1007       Component Value Date/Time   WBC 10.5  11/02/2020 0828   RBC 4.28 11/02/2020 0828   HGB 13.0 11/02/2020 0828   HCT 38.1 11/02/2020 0828   PLT 256 11/02/2020 0828   MCV 89.0 11/02/2020 0828   MCH 30.4 11/02/2020 0828   MCHC 34.1 11/02/2020 0828   RDW 12.6 11/02/2020 0828   LYMPHSABS 3.5 11/02/2020 0828   MONOABS 0.6 11/02/2020 0828   EOSABS 0.1 11/02/2020 0828   BASOSABS 0.0 11/02/2020 0828    No results found for: POCLITH, LITHIUM   No results found for: PHENYTOIN, PHENOBARB, VALPROATE, CBMZ   .res Assessment: Plan:     Plan:  PDMP reviewed  Continue:  1. Adderall XR 25mg  2. Adderall 20mg  daily 3. Xanax 1mg  BID 4. Pristiq 50mg  daily 5. Pristiq 25mg  daily  Patient is  totally disabled and unable to perform job duties at this time. FMLA was recommended for the next 30 days - 03/19/2021 through 04/16/2021. Patient to provide FMLA paperwork for completion and notify her HR department.   RTC 4 weeks   Patient advised to contact office with any questions, adverse effects, or acute worsening in signs and symptoms.  Discussed potential benefits, risk, and side effects of benzodiazepines to include potential risk of tolerance and dependence, as well as possible drowsiness.  Advised patient not to drive if experiencing drowsiness and to take lowest possible effective dose to minimize risk of dependence and tolerance.  Discussed potential benefits, risks, and side effects of stimulants with patient to include increased heart rate, palpitations, insomnia, increased anxiety, increased irritability, or decreased appetite.  Instructed patient to contact office if experiencing any significant tolerability issues.   Diagnoses and all orders for this visit:  Generalized anxiety disorder  Attention deficit hyperactivity disorder (ADHD), combined type, moderate  Panic disorder  Mixed obsessional thoughts and acts    Please see After Visit Summary for patient specific instructions.  Future Appointments  Date Time  Provider Department Center  04/18/2021  5:00 PM Mathis Fare, LCSW [facility] None  05/02/2021  5:00 PM Mathis Fare, LCSW [facility] None  05/14/2021  9:30 AM Early, Sung Amabile, NP [facility]  05/16/2021  5:00 PM Mathis Fare, LCSW [facility] None  05/24/2021  8:20 AM Ohm Dentler, Thereasa Solo, NP [facility] None  05/30/2021  5:00 PM Mathis Fare, LCSW [facility] None  06/13/2021  5:00 PM Mathis Fare, LCSW [facility] None  06/28/2021  5:00 PM Mathis Fare, LCSW [facility] None    No orders of the defined types were placed in this encounter.   -------------------------------

## 2021-04-16 ENCOUNTER — Telehealth: Payer: Self-pay | Admitting: Adult Health

## 2021-04-16 NOTE — Telephone Encounter (Signed)
Sophia at [facility] called regarding patient.  She is trying to confirm that an emotional support animal was requested and confirm that she had a visit her on 9/9, which she did.  Pls call Sophia back at [phone].

## 2021-04-16 NOTE — Telephone Encounter (Signed)
We need to confirm who this is and if she has access to this information.

## 2021-04-16 NOTE — Telephone Encounter (Signed)
Pt was contacted on this matter. Pt aware of Sophia calling to verify provider and letter given @ 9/9 apt., concerning emotional support animal. Pt gave verbal consent to call and verify this information from apt on 9/9.    Jeanette Caprice [phone] was called and left message as prompted on voice mail.

## 2021-04-16 NOTE — Telephone Encounter (Signed)
Thanks Tammy 

## 2021-04-18 ENCOUNTER — Ambulatory Visit (INDEPENDENT_AMBULATORY_CARE_PROVIDER_SITE_OTHER): Payer: 59 | Admitting: Psychiatry

## 2021-04-18 ENCOUNTER — Other Ambulatory Visit: Payer: Self-pay

## 2021-04-18 DIAGNOSIS — F411 Generalized anxiety disorder: Secondary | ICD-10-CM

## 2021-04-18 NOTE — Progress Notes (Signed)
[facility] Counselor/Therapist Progress Note  Patient ID: Mariah Young, MRN: [id number],    Date: 04/18/2021  Time Spent: 58 minutes   Treatment Type: Individual Therapy  Reported Symptoms: anxiety, depression  Mental Status Exam:  Appearance:   Casual     Behavior:  Appropriate, Sharing, and Motivated  Motor:  Normal  Speech/Language:   Clear and Coherent and Normal Rate  Affect:  Depressed and anxious  Mood:  anxious and depressed  Thought process:  goal directed  Thought content:    Some overthinking and obsessiveness  Sensory/Perceptual disturbances:    WNL  Orientation:  oriented to person, place, time/date, situation, day of week, month of year, year, and stated date of Sept 28, 2022  Attention:  Good  Concentration:  Good  Memory:  WNL  Fund of knowledge:   Good  Insight:    Good and Fair  Judgment:   Good  Impulse Control:  Good/Fair   Risk Assessment: Danger to Self:  No Self-injurious Behavior: No Danger to Others: No Duty to Warn:no Physical Aggression / Violence:No  Access to Firearms a concern: No  Gang Involvement:No   Subjective:  Patient in today reporting anxiety and depression which has decreased "a little" while on leave from work recently.  This is second day back at work today. Not as overwhelmed. Work and job interviews "up in the air right now." Feeling more secure in her relationship with BF, but behavior indicates otherwise at time. Anxiety and depression related mostly to personal, work, and relationship, but states she's feeling more comfortable in relationship. Reports some improvement in self-confidence. Trying to make decisions that involve her work life and her current relationship with BF. States she'd processing scenarios in her head and is getting overwhelmed.  Processed her thoughts and feelings today as she is looking at making decision based on incomplete info and not really knowing what her choices are at this time, and is is  becoming overwhelming for her. Lots of overthinking  re: job and relationship to the point of overstressing over issue and questions that she doesn't know exactly what options may emerge for her. To continue trying to not get back into "obsessive thought patterns and looking for what might go wrong versus right." Focusing on not overstressing over things she can't change and control, and not weighing options that she doesn't know for sure at this point. Hard "not to have the control I want" and we discussed the control issues going on with her right now, when seemed to help support patient.   Interventions: Solution-Oriented/Positive Psychology, Ego-Supportive, and Insight-Oriented  Diagnosis:   ICD-10-CM   1. Generalized anxiety disorder  F41.1       Treatment goal plan :  Patient not signing tx plan on computer screen due to Covid. Treatment Goals: Goals will remain on tx plan as patient works with strategies to achieve her goals. Progress will be noted each session and documented in "Progress" section of goal plan. Long term goal: (Measurable) 1. Stabilize anxiety level while increasing ability to function on a daily basis.  2. Patient will eventually progress to where she rates her anxiety as a "3" or less on "1-10 Anxiety scale" for at least 2 months. Short term goal: Increase understanding of beliefs and messages that produce anxiety, worry,fear, and negativity.  Strategies: Identify, challenge, and replace anxious/fearful/negative with positive, hopeful, and empoweriing self-talk.    Plan:  Patient today was motivated and very engaged in session as she  worked well on some "control issues", obsessing and stressing "over potential choices that she does know if they will be choices for her", and we were able to interrupt this cycle as it was happening in session and look at ways to better manage her thoughts and letting go of some of the control realizing she really does not have control  over those things right now.  Has returned to work as this was her second day back and things have gone okay so far.  Reports focusing on more positive self-care and having healthy boundaries.  Encouraged patient in practicing positive behaviors that can be helpful to her including: Getting outside daily and walking, staying in the present and focusing on what she can control, good nutrition and exercise, reaching out to people in her support network, interrupting and challenging/replacing anxious/negative/depressive/fearful thoughts to be more reality based and empowering thoughts that do not feed her anxiety nor depression, spending time with her dog which is very therapeutic for her, continue work on adjusting more easily when things do not go as planned, stop imagining worst case scenarios, stop self negating, practice consistent positive self talk, let go of negative thoughts and assumptions that hold her back, intentionally look for positives within herself, practice self forgiveness and forgiveness of others, look for more positives versus negatives daily, and realize the strength she shows as she works with goal-directed behaviors to move in a direction that supports improved emotional health and stability.  Goal review and progress/challenges noted with patient.  Next appointment within 2 weeks.  This record has been created using [employer].  Chart creation errors have been sought, but may not always have been located and corrected.  Such creation errors do not reflect on the standard of medical care provided.   Mathis Fare, LCSW

## 2021-05-02 ENCOUNTER — Other Ambulatory Visit: Payer: Self-pay

## 2021-05-02 ENCOUNTER — Ambulatory Visit (INDEPENDENT_AMBULATORY_CARE_PROVIDER_SITE_OTHER): Payer: 59 | Admitting: Psychiatry

## 2021-05-02 DIAGNOSIS — F411 Generalized anxiety disorder: Secondary | ICD-10-CM

## 2021-05-02 NOTE — Progress Notes (Signed)
[facility] Counselor/Therapist Progress Note  Patient ID: Mariah Young, MRN: [id number],    Date: 05/02/2021  Time Spent: 57 minutes   Treatment Type: Individual Therapy  Reported Symptoms: anxiety, depression (decreased)  Mental Status Exam:  Appearance:   Casual     Behavior:  Appropriate, Sharing, and Motivated  Motor:  Normal  Speech/Language:   Clear and Coherent  Affect:  Depressed and anxious  Mood:  anxious and depressed  Thought process:  goal directed  Thought content:    Some obsessiveness  Sensory/Perceptual disturbances:    WNL  Orientation:  oriented to person, place, time/date, situation, day of week, month of year, year, and stated date of Oct. 12, 2022  Attention:  Good  Concentration:  Good  Memory:  WNL  Fund of knowledge:   Good  Insight:    Good  Judgment:   Good  Impulse Control:  Good and Fair   Risk Assessment: Danger to Self:  No Self-injurious Behavior: No Danger to Others: No Duty to Warn:no Physical Aggression / Violence:No  Access to Firearms a concern: No  Gang Involvement:No   Subjective:  Patient in today reporting anxiety and that it and her depression has decreased some. Did get new job since last appt and it will be based here locally. Is very excited about new job and looking forward to the change. Not feeling as drained and overwhelmed. Relationship with BF going well and feeling more secure. Anxiety and depression she reports is related to stress of new job approaching.  Finds herself worrying "whether the new job will work out" and we worked on helping her interrupt the anxious/negative/fearful thoughts and challenge and replace them with more reality-based and encouraging thoughts. Working to  "not let worry take over" and instead be looking for the "positives" in her new job. Some improvement in her level of self-confidence. BF is supportive of her new job.  Worked  on  staying in the present and stopping her jumping ahead,  questioning and making fearful/negative assumptions. Overthinking  leading to over-stressing and worked more on this is in session today, feeling committed to decrease her overstressing and overthinking/overanalyzing, and let go of stressing over things she can't control. Still struggling some "control" issues and is motivated to continue work on this.   Interventions: Solution-Oriented/Positive Psychology, Ego-Supportive, and Insight-Oriented  Diagnosis:   ICD-10-CM   1. Generalized anxiety disorder  F41.1       Treatment goal plan :  Patient not signing tx plan on computer screen due to Covid. Treatment Goals: Goals will remain on tx plan as patient works with strategies to achieve her goals. Progress will be noted each session and documented in "Progress" section of goal plan. Long term goal: (Measurable) 1. Stabilize anxiety level while increasing ability to function on a daily basis.  2. Patient will eventually progress to where she rates her anxiety as a "3" or less on "1-10 Anxiety scale" for at least 2 months. Short term goal: Increase understanding of beliefs and messages that produce anxiety, worry,fear, and negativity.  Strategies: Identify, challenge, and replace anxious/fearful/negative with positive, hopeful, and empoweriing self-talk.   Plan: Patient today showing good motivation and participation in session, as she continued her work on her anxiety, depression, and difficulty with "control" issues as noted above.  Encouraged patient in practicing positive behaviors that can be helpful to her including: Staying in the present and focusing on what she can control, good nutrition and exercise, reaching out  to people in her support network, getting outside daily and walking, interrupting and challenging/replacing anxious/negative/depressive/fearful thoughts to be more reality based and empowering thoughts that do not feed her anxiety nor depression, spending time with her dog which  is very therapeutic for her, continue work on adjusting more easily when things do not go as planned, stop imagining worst case scenarios, stop self negating, practice consistent positive self talk, letting go of negative thoughts and assumptions that hold her back, intentionally look for positives within herself, practice self forgiveness and forgiveness of others, look for more positives versus negatives each day, and feel good about the strength she shows as she works with goal-directed behaviors to move in a direction that supports overall stability and improved emotional health.  Review and progress/challenges noted with patient.  Next appointment within 2 weeks.  This record has been created using [employer].  Chart creation errors have been sought, but may not always have been located and corrected.  Such creation errors do not reflect on the standard of medical care provided.    Mathis Fare, LCSW

## 2021-05-14 ENCOUNTER — Ambulatory Visit (INDEPENDENT_AMBULATORY_CARE_PROVIDER_SITE_OTHER): Payer: 59 | Admitting: Nurse Practitioner

## 2021-05-14 ENCOUNTER — Other Ambulatory Visit: Payer: Self-pay

## 2021-05-14 ENCOUNTER — Encounter (HOSPITAL_BASED_OUTPATIENT_CLINIC_OR_DEPARTMENT_OTHER): Payer: Self-pay | Admitting: Nurse Practitioner

## 2021-05-14 VITALS — BP 128/60 | HR 59 | Ht 68.0 in | Wt 211.2 lb

## 2021-05-14 DIAGNOSIS — Z0000 Encounter for general adult medical examination without abnormal findings: Secondary | ICD-10-CM

## 2021-05-14 DIAGNOSIS — Z23 Encounter for immunization: Secondary | ICD-10-CM

## 2021-05-14 DIAGNOSIS — F411 Generalized anxiety disorder: Secondary | ICD-10-CM | POA: Diagnosis not present

## 2021-05-14 NOTE — Assessment & Plan Note (Signed)
CPE performed today with no concerning findings.  Will obtain labs for evaluation No HM recommendations at this time F/U in 1 year or sooner if needed.
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
## 2021-05-14 NOTE — Patient Instructions (Addendum)
I will let you know if we need to make any changes based on your labs.  I will let you know about the wart treatment once I get in touch with dermatology.

## 2021-05-14 NOTE — Assessment & Plan Note (Signed)
Exacerbated at this time related to upcoming job change.  No concerning findings present or red flags Reassurance provided.  No changes to plan of care

## 2021-05-15 LAB — CBC WITH DIFFERENTIAL
Basophils Absolute: 0 10*3/uL (ref 0.0–0.2)
Basos: 1 %
EOS (ABSOLUTE): 0 10*3/uL (ref 0.0–0.4)
Eos: 0 %
Hematocrit: 44.1 % (ref 34.0–46.6)
Hemoglobin: 15.1 g/dL (ref 11.1–15.9)
Immature Grans (Abs): 0 10*3/uL (ref 0.0–0.1)
Immature Granulocytes: 0 %
Lymphocytes Absolute: 2.3 10*3/uL (ref 0.7–3.1)
Lymphs: 31 %
MCH: 31.1 pg (ref 26.6–33.0)
MCHC: 34.2 g/dL (ref 31.5–35.7)
MCV: 91 fL (ref 79–97)
Monocytes Absolute: 0.3 10*3/uL (ref 0.1–0.9)
Monocytes: 4 %
Neutrophils Absolute: 4.8 10*3/uL (ref 1.4–7.0)
Neutrophils: 64 %
RBC: 4.85 x10E6/uL (ref 3.77–5.28)
RDW: 12.6 % (ref 11.7–15.4)
WBC: 7.5 10*3/uL (ref 3.4–10.8)

## 2021-05-15 LAB — COMPREHENSIVE METABOLIC PANEL
ALT: 13 IU/L (ref 0–32)
AST: 17 IU/L (ref 0–40)
Albumin/Globulin Ratio: 2 (ref 1.2–2.2)
Albumin: 5.1 g/dL — ABNORMAL HIGH (ref 3.9–5.0)
Alkaline Phosphatase: 76 IU/L (ref 44–121)
BUN/Creatinine Ratio: 17 (ref 9–23)
BUN: 12 mg/dL (ref 6–20)
Bilirubin Total: 0.2 mg/dL (ref 0.0–1.2)
CO2: 22 mmol/L (ref 20–29)
Calcium: 10 mg/dL (ref 8.7–10.2)
Chloride: 101 mmol/L (ref 96–106)
Creatinine, Ser: 0.7 mg/dL (ref 0.57–1.00)
Globulin, Total: 2.6 g/dL (ref 1.5–4.5)
Glucose: 90 mg/dL (ref 70–99)
Potassium: 4.6 mmol/L (ref 3.5–5.2)
Sodium: 140 mmol/L (ref 134–144)
Total Protein: 7.7 g/dL (ref 6.0–8.5)
eGFR: 123 mL/min/{1.73_m2} (ref >59)

## 2021-05-15 LAB — LIPID PANEL
Chol/HDL Ratio: 3 ratio (ref 0.0–4.4)
Cholesterol, Total: 180 mg/dL (ref 100–199)
HDL: 60 mg/dL (ref >39)
LDL Chol Calc (NIH): 107 mg/dL — ABNORMAL HIGH (ref 0–99)
Triglycerides: 71 mg/dL (ref 0–149)
VLDL Cholesterol Cal: 13 mg/dL (ref 5–40)

## 2021-05-15 LAB — TSH: TSH: 2.67 u[IU]/mL (ref 0.450–4.500)

## 2021-05-16 ENCOUNTER — Ambulatory Visit (INDEPENDENT_AMBULATORY_CARE_PROVIDER_SITE_OTHER): Payer: 59 | Admitting: Psychiatry

## 2021-05-16 ENCOUNTER — Other Ambulatory Visit: Payer: Self-pay

## 2021-05-16 DIAGNOSIS — F411 Generalized anxiety disorder: Secondary | ICD-10-CM | POA: Diagnosis not present

## 2021-05-16 NOTE — Progress Notes (Signed)
[facility] Counselor/Therapist Progress Note  Patient ID: Mariah Young, MRN: [id number],    Date: 05/16/2021  Time Spent: 50 minutes   Treatment Type: Individual Therapy  Reported Symptoms: anxiety, depression, overwhelmed  Mental Status Exam:  Appearance:   Neat     Behavior:  Appropriate, Sharing, and Motivated  Motor:  Normal  Speech/Language:   Clear and Coherent  Affect:  Depressed and anxious  Mood:  anxious and depressed  Thought process:  goal directed  Thought content:    overthinking  Sensory/Perceptual disturbances:    WNL  Orientation:  oriented to person, place, time/date, situation, day of week, month of year, year, and stated date of Oct. 26, 2022  Attention:  Good  Concentration:  Good  Memory:  WNL  Fund of knowledge:   Good  Insight:    Good and Fair  Judgment:   Good  Impulse Control:  Good   Risk Assessment: Danger to Self:  No Self-injurious Behavior: No Danger to Others: No Duty to Warn:no Physical Aggression / Violence:No  Access to Firearms a concern: No  Gang Involvement:No   Subjective:  Patient in today reporting anxiety, depression, some overwhelmedness. Is to start her new job Nov. 7th.  Patient processing her stressful, anxious thoughts mostly related to the transitions she is going through in accepting a new job. Feeling overwhelmed with all she needs to do before leaving current job to begin her new job. Trying to establish some priorities to get everything done in a small time frame. Also processing some sadness in leaving and saying goodbye to current co-workers.  Working further today on "control issues" and being able to tolerate "not being in control" in situations where that is the case. Focusing on good self-care especially these next few days as she prepares to begin her new job. Trying to decrease her overthinking and trust more that things will be ok and this upcoming change is a good opportunity for her.  Interventions:  Solution-Oriented/Positive Psychology, Ego-Supportive, and Insight-Oriented  Diagnosis:   ICD-10-CM   1. Generalized anxiety disorder  F41.1      Treatment goal plan :  Patient not signing tx plan on computer screen due to Covid. Treatment Goals: Goals will remain on tx plan as patient works with strategies to achieve her goals. Progress will be noted each session and documented in "Progress" section of goal plan. Long term goal: (Measurable) 1. Stabilize anxiety level while increasing ability to function on a daily basis.  2. Patient will eventually progress to where she rates her anxiety as a "3" or less on "1-10 Anxiety scale" for at least 2 months. Short term goal: Increase understanding of beliefs and messages that produce anxiety, worry,fear, and negativity.  Strategies: Identify, challenge, and replace anxious/fearful/negative with positive, hopeful, and empoweriing self-talk.    Plan: Patient today showing good motivation, although stressed "with so much to get done before leaving work to start my new job November 7th. Showing continues commitment to working on her anxiety, depression, and trying to lessen her control issues. Encouraged patient to practice positive behaviors we've discussed in sessions including: reaching out to people in her support network, staying in the present and focusing on what she can control, good nutrition and exercise, getting outside daily and walking, interrupting and challenging/replacing anxious/negative/depressive/fearful thoughts to be more reality based and empowering thoughts that do not feed her anxiety nor depression, spending time with her dog which is very therapeutic for her, continue work on  adjusting more easily when things do not go as planned, stop imagining worst case scenarios, practice consistent positive self talk, stop self negating, let go of negative thoughts and assumptions that hold her back, intentionally look for more positives  within herself, practice self forgiveness and forgiveness of others, look for more positives versus negatives each day, and realize the strength she shows working with goal-directed behaviors to move in a direction that supports improved emotional health and overall stability.  Goal review and progress/challenges noted with patient.  Next appointment within 2 to 3 weeks.  This record has been created using [employer].  Chart creation errors have been sought, but may not always have been located and corrected.  Such creation errors do not reflect on the standard of medical care provided.    Mathis Fare, LCSW

## 2021-05-18 ENCOUNTER — Encounter (HOSPITAL_BASED_OUTPATIENT_CLINIC_OR_DEPARTMENT_OTHER): Payer: Self-pay | Admitting: Nurse Practitioner

## 2021-05-18 DIAGNOSIS — B079 Viral wart, unspecified: Secondary | ICD-10-CM

## 2021-05-20 LAB — QUANTIFERON-TB GOLD PLUS
QuantiFERON Mitogen Value: >10 IU/mL
QuantiFERON Nil Value: 0.03 IU/mL
QuantiFERON TB1 Ag Value: 0.03 IU/mL
QuantiFERON TB2 Ag Value: 0.02 IU/mL
QuantiFERON-TB Gold Plus: NEGATIVE

## 2021-05-21 ENCOUNTER — Telehealth (HOSPITAL_BASED_OUTPATIENT_CLINIC_OR_DEPARTMENT_OTHER): Payer: Self-pay

## 2021-05-21 NOTE — Telephone Encounter (Signed)
Patient is aware and agreeable to lab results and recommendations Reviewed all labs with patient Patient inquired about dermatology referral for warts Will route to Shawna Clamp, DNP for advisement

## 2021-05-21 NOTE — Telephone Encounter (Signed)
-----   Message from Mariah Eth, NP sent at 05/21/2021  8:14 AM EDT ----- [name],   Your Quantiferon TB test was negative. You should be able to send these results to your work.

## 2021-05-22 NOTE — Addendum Note (Signed)
Addended by: Dareen Piano on: 05/22/2021 07:53 AM   Modules accepted: Orders

## 2021-05-24 ENCOUNTER — Ambulatory Visit (INDEPENDENT_AMBULATORY_CARE_PROVIDER_SITE_OTHER): Payer: 59 | Admitting: Adult Health

## 2021-05-24 ENCOUNTER — Encounter: Payer: Self-pay | Admitting: Adult Health

## 2021-05-24 ENCOUNTER — Other Ambulatory Visit: Payer: Self-pay

## 2021-05-24 DIAGNOSIS — F422 Mixed obsessional thoughts and acts: Secondary | ICD-10-CM | POA: Diagnosis not present

## 2021-05-24 DIAGNOSIS — F902 Attention-deficit hyperactivity disorder, combined type: Secondary | ICD-10-CM

## 2021-05-24 DIAGNOSIS — F410 Panic disorder [episodic paroxysmal anxiety] without agoraphobia: Secondary | ICD-10-CM | POA: Diagnosis not present

## 2021-05-24 DIAGNOSIS — F411 Generalized anxiety disorder: Secondary | ICD-10-CM | POA: Diagnosis not present

## 2021-05-24 MED ORDER — ALPRAZOLAM 1 MG PO TABS: 1.0000 mg | ORAL_TABLET | Freq: Two times a day (BID) | ORAL | 2 refills | Status: DC | PRN

## 2021-05-24 MED ORDER — DESVENLAFAXINE SUCCINATE ER 25 MG PO TB24: 25.0000 mg | ORAL_TABLET | Freq: Every day | ORAL | 5 refills | Status: DC

## 2021-05-24 MED ORDER — AMPHETAMINE-DEXTROAMPHET ER 25 MG PO CP24: 50.0000 mg | ORAL_CAPSULE | Freq: Every day | ORAL | 0 refills | Status: DC

## 2021-05-24 MED ORDER — AMPHETAMINE-DEXTROAMPHETAMINE 20 MG PO TABS
20.0000 mg | ORAL_TABLET | Freq: Every day | ORAL | 0 refills | Status: DC
Start: 2021-05-24 — End: 2021-12-31

## 2021-05-24 MED ORDER — AMPHETAMINE-DEXTROAMPHET ER 25 MG PO CP24: ORAL_CAPSULE | ORAL | 0 refills | Status: DC

## 2021-05-24 MED ORDER — AMPHETAMINE-DEXTROAMPHETAMINE 20 MG PO TABS: 20.0000 mg | ORAL_TABLET | Freq: Every day | ORAL | 0 refills | Status: DC

## 2021-05-24 MED ORDER — DESVENLAFAXINE SUCCINATE ER 50 MG PO TB24: 50.0000 mg | ORAL_TABLET | Freq: Every day | ORAL | 5 refills | Status: DC

## 2021-05-24 NOTE — Progress Notes (Signed)
Mariah Young [id number] October 23, 1995 25 y.o.  Subjective:   Patient ID:  Mariah Young is a 25 y.o. (DOB [birth year]) female.  Chief Complaint: No chief complaint on file.   HPI Mariah Young presents to the office today for follow-up of ADHD, panic disorder, GAD, and obsessional thoughts.   Describes mood today as "better". Flat. Decreased tearfulness. Mood symptoms - reports mild depression, anxiety, and irritability. Decreased panic attacks - "nothing major". Stating "I'm doing better". Has accepted a new job and given notice with current employer. Starting new job next week. She and partner doing well. Improved interest and motivation. Working with therapist - Rinaldo Cloud. Taking medications as prescribed. Energy levels improving - has been tired - diagnosed with mild sleep apnea. Active, does not have a regular exercise routine.  Enjoys some usual interests and activities. In a relationship. Lives alone with dog - "[name]". Family in [place]. Appetite adequate. Weight stable. Sleep is improving. Averages 6 to 8 hours.  Focus and concentration stable. Completing tasks. Managing aspects of household. Planning to start a new job next week - "I'm excited about the change".  Denies SI or HI.  Denies AH or VH.  Previous medication trials: Prozac   GAD-7    Flowsheet Row Office Visit from 11/14/2020 in [facility]  Total GAD-7 Score 11      PHQ2-9    [place] Office Visit from 11/14/2020 in [facility]  PHQ-2 Total Score 3  PHQ-9 Total Score 14      Flowsheet Row ED from 03/08/2021 in [facility]  ED from 02/22/2021 in [facility] [place] ED from 11/02/2020 in [facility]  C-SSRS RISK CATEGORY No Risk No Risk No Risk        Review of Systems:  Review of Systems  Musculoskeletal:  Negative for  gait problem.  Neurological:  Negative for tremors.  Psychiatric/Behavioral:         Please refer to HPI   Medications: I have reviewed the patient's current medications.  Current Outpatient Medications  Medication Sig Dispense Refill   ALPRAZolam (XANAX) 1 MG tablet Take 1 tablet (1 mg total) by mouth 2 (two) times daily as needed for anxiety. 60 tablet 2   amphetamine-dextroamphetamine (ADDERALL XR) 25 MG 24 hr capsule Take two capsules by mouth daily after breakfast. 60 capsule 0   [START ON 06/21/2021] amphetamine-dextroamphetamine (ADDERALL XR) 25 MG 24 hr capsule Take two capsules by mouth daily after breakfast. 60 capsule 0   [START ON 07/19/2021] amphetamine-dextroamphetamine (ADDERALL XR) 25 MG 24 hr capsule Take 2 capsules by mouth daily after breakfast. 60 capsule 0   amphetamine-dextroamphetamine (ADDERALL) 20 MG tablet Take 1 tablet (20 mg total) by mouth daily. 30 tablet 0   [START ON 06/21/2021] amphetamine-dextroamphetamine (ADDERALL) 20 MG tablet Take 1 tablet (20 mg total) by mouth daily before supper. 30 tablet 0   [START ON 07/19/2021] amphetamine-dextroamphetamine (ADDERALL) 20 MG tablet Take 1 tablet (20 mg total) by mouth daily. 30 tablet 0   desvenlafaxine (PRISTIQ) 25 MG 24 hr tablet Take 1 tablet (25 mg total) by mouth daily. 30 tablet 5   desvenlafaxine (PRISTIQ) 50 MG 24 hr tablet Take 1 tablet (50 mg total) by mouth daily. 30 tablet 5   Multiple Vitamin (ONE-DAILY MULTI-VITAMIN) TABS Take 1 tablet by mouth daily.     No current facility-administered medications for this visit.  Medication Side Effects: None  Allergies:  Allergies  Allergen Reactions   Clarithromycin Other (See Comments) and Rash    Vasculitis Vasculitis  Other reaction(s): Unknown   Penicillins Other (See Comments)    Other reaction(s): Intolerance vasculitis vasculitis    Penicillin G     Other reaction(s): Unknown    Past Medical History:  Diagnosis Date   Acute non-recurrent  maxillary sinusitis 07/12/2017   ADHD (attention deficit hyperactivity disorder)    Anxiety    Cough 07/12/2017   Diaphoresis    Dysmenorrhea    IUD (intrauterine device) in place    Obesity (BMI 30.0-34.9)    Obsessive compulsive disorder     Past Medical History, Surgical history, Social history, and Family history were reviewed and updated as appropriate.   Please see review of systems for further details on the patient's review from today.   Objective:   Physical Exam:  LMP 05/09/2021 (Approximate)   Physical Exam Constitutional:      General: She is not in acute distress. Musculoskeletal:        General: No deformity.  Neurological:     Mental Status: She is alert and oriented to person, place, and time.     Coordination: Coordination normal.  Psychiatric:        Attention and Perception: Attention and perception normal. She does not perceive auditory or visual hallucinations.        Mood and Affect: Mood normal. Mood is not anxious or depressed. Affect is not labile, blunt, angry or inappropriate.        Speech: Speech normal.        Behavior: Behavior normal.        Thought Content: Thought content normal. Thought content is not paranoid or delusional. Thought content does not include homicidal or suicidal ideation. Thought content does not include homicidal or suicidal plan.        Cognition and Memory: Cognition and memory normal.        Judgment: Judgment normal.     Comments: Insight intact    Lab Review:     Component Value Date/Time   NA 140 05/14/2021 1049   K 4.6 05/14/2021 1049   CL 101 05/14/2021 1049   CO2 22 05/14/2021 1049   GLUCOSE 90 05/14/2021 1049   GLUCOSE 95 11/14/2020 1007   BUN 12 05/14/2021 1049   CREATININE 0.70 05/14/2021 1049   CALCIUM 10.0 05/14/2021 1049   PROT 7.7 05/14/2021 1049   ALBUMIN 5.1 (H) 05/14/2021 1049   AST 17 05/14/2021 1049   ALT 13 05/14/2021 1049   ALKPHOS 76 05/14/2021 1049   BILITOT 0.2 05/14/2021 1049    GFRNONAA >60 11/14/2020 1007       Component Value Date/Time   WBC 7.5 05/14/2021 1049   WBC 10.5 11/02/2020 0828   RBC 4.85 05/14/2021 1049   RBC 4.28 11/02/2020 0828   HGB 15.1 05/14/2021 1049   HCT 44.1 05/14/2021 1049   PLT 256 11/02/2020 0828   MCV 91 05/14/2021 1049   MCH 31.1 05/14/2021 1049   MCH 30.4 11/02/2020 0828   MCHC 34.2 05/14/2021 1049   MCHC 34.1 11/02/2020 0828   RDW 12.6 05/14/2021 1049   LYMPHSABS 2.3 05/14/2021 1049   MONOABS 0.6 11/02/2020 0828   EOSABS 0.0 05/14/2021 1049   BASOSABS 0.0 05/14/2021 1049    No results found for: POCLITH, LITHIUM   No results found for: PHENYTOIN, PHENOBARB, VALPROATE, CBMZ   .res Assessment: Plan:  Plan:  PDMP reviewed  Continue:  1. Adderall XR 25mg  2. Adderall 20mg  daily 3. Xanax 1mg  BID 4. Pristiq 50mg  daily 5. Pristiq 25mg  daily  125/89/93  RTC 3 months  Patient advised to contact office with any questions, adverse effects, or acute worsening in signs and symptoms.  Discussed potential benefits, risk, and side effects of benzodiazepines to include potential risk of tolerance and dependence, as well as possible drowsiness.  Advised patient not to drive if experiencing drowsiness and to take lowest possible effective dose to minimize risk of dependence and tolerance.  Discussed potential benefits, risks, and side effects of stimulants with patient to include increased heart rate, palpitations, insomnia, increased anxiety, increased irritability, or decreased appetite.  Instructed patient to contact office if experiencing any significant tolerability issues.  Diagnoses and all orders for this visit:  Attention deficit hyperactivity disorder (ADHD), combined type, moderate -     amphetamine-dextroamphetamine (ADDERALL XR) 25 MG 24 hr capsule; Take two capsules by mouth daily after breakfast. -     amphetamine-dextroamphetamine (ADDERALL XR) 25 MG 24 hr capsule; Take two capsules by mouth daily after  breakfast. -     amphetamine-dextroamphetamine (ADDERALL XR) 25 MG 24 hr capsule; Take 2 capsules by mouth daily after breakfast. -     amphetamine-dextroamphetamine (ADDERALL) 20 MG tablet; Take 1 tablet (20 mg total) by mouth daily. -     amphetamine-dextroamphetamine (ADDERALL) 20 MG tablet; Take 1 tablet (20 mg total) by mouth daily before supper. -     amphetamine-dextroamphetamine (ADDERALL) 20 MG tablet; Take 1 tablet (20 mg total) by mouth daily. -     desvenlafaxine (PRISTIQ) 50 MG 24 hr tablet; Take 1 tablet (50 mg total) by mouth daily.  Generalized anxiety disorder -     ALPRAZolam (XANAX) 1 MG tablet; Take 1 tablet (1 mg total) by mouth 2 (two) times daily as needed for anxiety. -     desvenlafaxine (PRISTIQ) 50 MG 24 hr tablet; Take 1 tablet (50 mg total) by mouth daily. -     desvenlafaxine (PRISTIQ) 25 MG 24 hr tablet; Take 1 tablet (25 mg total) by mouth daily.  Panic disorder -     ALPRAZolam (XANAX) 1 MG tablet; Take 1 tablet (1 mg total) by mouth 2 (two) times daily as needed for anxiety. -     desvenlafaxine (PRISTIQ) 50 MG 24 hr tablet; Take 1 tablet (50 mg total) by mouth daily. -     desvenlafaxine (PRISTIQ) 25 MG 24 hr tablet; Take 1 tablet (25 mg total) by mouth daily.  Mixed obsessional thoughts and acts -     ALPRAZolam (XANAX) 1 MG tablet; Take 1 tablet (1 mg total) by mouth 2 (two) times daily as needed for anxiety. -     desvenlafaxine (PRISTIQ) 50 MG 24 hr tablet; Take 1 tablet (50 mg total) by mouth daily. -     desvenlafaxine (PRISTIQ) 25 MG 24 hr tablet; Take 1 tablet (25 mg total) by mouth daily.    Please see After Visit Summary for patient specific instructions.  Future Appointments  Date Time Provider [place]  05/30/2021  5:00 PM Shanon Ace, LCSW [facility] None  06/13/2021  8:00 AM Shanon Ace, LCSW [facility] None  06/28/2021  5:00 PM Shanon Ace, LCSW [facility] None  07/11/2021  5:00 PM Shanon Ace, LCSW [facility] None  07/24/2021  5:00 PM Shanon Ace, LCSW [facility] None  08/08/2021  5:00 PM Shanon Ace, LCSW [facility] None  05/16/2022  3:10 PM  Orma Render, NP [facility]    No orders of the defined types were placed in this encounter.   -------------------------------

## 2021-05-30 ENCOUNTER — Other Ambulatory Visit: Payer: Self-pay

## 2021-05-30 ENCOUNTER — Ambulatory Visit (INDEPENDENT_AMBULATORY_CARE_PROVIDER_SITE_OTHER): Payer: 59 | Admitting: Psychiatry

## 2021-05-30 DIAGNOSIS — F411 Generalized anxiety disorder: Secondary | ICD-10-CM | POA: Diagnosis not present

## 2021-05-30 NOTE — Progress Notes (Signed)
[facility] Counselor/Therapist Progress Note  Patient ID: Mariah Young, MRN: [id number],    Date: 05/30/2021  Time Spent: 58 minutes  Treatment Type: Individual Therapy  Reported Symptoms: anxiety, some depression  Mental Status Exam:  Appearance:   Casual     Behavior:  Appropriate and Sharing  Motor:  Normal  Speech/Language:   Clear and Coherent  Affect:  Depressed and anxious  Mood:  anxious and depressed  Thought process:  goal directed  Thought content:    Some overthinking  Sensory/Perceptual disturbances:    WNL  Orientation:  oriented to person, place, time/date, situation, day of week, month of year, year, and stated date of Nov. 9, 2022  Attention:  Good  Concentration:  Good  Memory:  WNL  Fund of knowledge:   Good  Insight:    Good  Judgment:   Good  Impulse Control:  Good   Risk Assessment: Danger to Self:  No Self-injurious Behavior: No Danger to Others: No Duty to Warn:no Physical Aggression / Violence:No  Access to Firearms a concern: No  Gang Involvement:No   Subjective:   Patient in today reporting anxiety and depression, with definite decrease in depression reported.  Started new job this past Monday and finds it exciting. Realizing her need to take "better care of myself overall." Not feeling as overwhelmed. Is in training for new job this week. Feeling better about herself. Still confronting anxious, stressful thoughts about her new job, but feels she is managing the stress "ok". Continues working to make some priorities in task management to get more accomplished and stay more focused.Shared some sadness in leaving prior job last week and able to process these feelings today. Reports still struggling with "not being in control and difficulty tolerating that". Discussed some examples of control issues she is confronting and feels so many changes occurring recently in her life, has definitely contributed to her feeling "so much is out of my  control". Paying good attention to her self-care during this time of transition and settling in to a new job which for her is exciting and also stressful. States she's trying to overthink less and believe in herself more.  Interventions: Solution-Oriented/Positive Psychology, Ego-Supportive, and Insight-Oriented  Diagnosis:   ICD-10-CM   1. Generalized anxiety disorder  F41.1      Treatment goal plan :  Patient not signing tx plan on computer screen due to Covid. Treatment Goals: Goals will remain on tx plan as patient works with strategies to achieve her goals. Progress will be noted each session and documented in "Progress" section of goal plan. Long term goal: (Measurable) 1. Stabilize anxiety level while increasing ability to function on a daily basis.  2. Patient will eventually progress to where she rates her anxiety as a "3" or less on "1-10 Anxiety scale" for at least 2 months. Short term goal: Increase understanding of beliefs and messages that produce anxiety, worry,fear, and negativity.  Strategies: Identify, challenge, and replace anxious/fearful/negative with positive, hopeful, and empoweriing self-talk.     Plan:  Patient today showing good motivation ad participation in session today. Worked on managing the changes and stressors of transitioning to new job, believing in herself more, trying to not be perfectionistic, but remaining motivated and focused on good self-care and getting g herself through this stressful time of starting her new job. Encouraged patient and practicing positive behaviors that have helped her previously including: Getting outside daily and walking, reaching out to people in her support  network, staying in the present focusing on what she can control, good nutrition and exercise, interrupting and challenging/replacing anxious/negative/depressive/fearful thoughts to be more reality based, spending time with her dog which is very therapeutic for patient,  continue working on adjusting more easily when things do not go as planned, let go of negative thoughts and assumptions that hold her back, stop imagining worst case scenarios, practice consistent positive self talk, stop self negating, intentionally look for more positives within herself, practice self forgiveness and forgiveness of others, look each day for more positives versus negatives, and feel good about the strength she shows working with goal-directed behaviors to move in a direction that supports improved emotional health.   Goal review and progress/challenges noted with patient.  Next appointment within 2-3 weeks.  This record has been created using [employer].  Chart creation errors have been sought, but may not always have been located and corrected.  Such creation errors do not reflect on the standard of medical care provided.    Mathis Fare, LCSW

## 2021-06-13 ENCOUNTER — Ambulatory Visit: Payer: 59 | Admitting: Psychiatry

## 2021-06-13 ENCOUNTER — Other Ambulatory Visit: Payer: Self-pay

## 2021-06-13 DIAGNOSIS — F411 Generalized anxiety disorder: Secondary | ICD-10-CM

## 2021-06-13 NOTE — Progress Notes (Signed)
[facility] Counselor/Therapist Progress Note  Patient ID: Mariah Young, MRN: [id number],    Date: 06/13/2021  Time Spent: 60 minutes   Treatment Type: Individual Therapy  Reported Symptoms: anxiety, depression  Mental Status Exam:  Appearance:   Casual     Behavior:  Appropriate, Sharing, and Motivated  Motor:  Normal  Speech/Language:   Clear and Coherent  Affect:  Depressed and anxious  Mood:  anxious and depressed  Thought process:  goal directed  Thought content:    Some overthinking  Sensory/Perceptual disturbances:    WNL  Orientation:  oriented to person, place, time/date, situation, day of week, month of year, year, and stated date of Nov. 23, 2022  Attention:  Good  Concentration:  Good and Fair  Memory:  WNL  Fund of knowledge:   Good  Insight:    Good  Judgment:   Good  Impulse Control:  Good   Risk Assessment: Danger to Self:  No Self-injurious Behavior: No Danger to Others: No Duty to Warn:no Physical Aggression / Violence:No  Access to Firearms a concern: No  Gang Involvement:No   Subjective:  Patient in today reporting anxiety and depression, with some improvement. 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 Discussed and signed ADA paperwork today for patient's accommodations for her new job. Following up from last session, reports trying to take better care of herself but feeling very overwhelmed with personal and work issues and with things feeling "unsettled" in new job and somewhat in relationship with BF. Dealing with lots of changes personally and in her career, and difficult to acknowledge the positives as she focused more on potential negatives. Encouraged patient to interrupt her negative/anxious thoughts and change them to more realistic thoughts that do not feed her anxiety nor depression. Still struggles with "not being in control and difficulty tolerating that." Trying to improve her task management and task completion.  Overthinking "but working to decrease it."   Interventions: Solution-Oriented/Positive Psychology, Environmental manager, and Insight-Oriented  Treatment goal plan :  Patient not signing tx plan on computer screen due to Covid. Treatment Goals: Goals will remain on tx plan as patient works with strategies to achieve her goals. Progress will be noted each session and documented in "Progress" section of goal plan. Long term goal: (Measurable) 1. Stabilize anxiety level while increasing ability to function on a daily basis.  2. Patient will eventually progress to where she rates her anxiety as a "3" or less on "1-10 Anxiety scale" for at least 2 months. Short term goal: Increase understanding of beliefs and messages that produce anxiety, worry,fear, and negativity.  Strategies: Identify, challenge, and replace anxious/fearful/negative with positive, hopeful, and empoweriing self-talk.    Diagnosis:   ICD-10-CM   1. Generalized anxiety disorder  F41.1       Plan:  Patient today showing good motivation and engagement in session as she continues to work on the changes in her life including the changes and stress of transitioning to new job, including decreasing her perfectionism, improving her outlook to focus more on the positive versus the "potential negatives", and working to have more self understanding.  Patient encouraged to follow through in more positive behaviors including: Believe in herself more, getting outside daily and walking some with her dog, staying in the present focusing on what she can control, reaching out to people in her support network, trying to stop jumping ahead and assuming worst case scenarios, good nutrition and exercise, interrupting and challenging/replacing  anxious/negative/depressive/fearful thoughts to be more reality based, spending more time with her dog which is very therapeutic for patient, continue working on adjusting more easily when things do not go as planned,  letting go of negative thoughts and assumptions that hold her back, stop imagining worst case scenarios, practice consistent positive self talk, stop self negating, intentionally look for more positives within herself, practice self forgiveness and forgiveness of others, look each day for more positives versus negatives, and recognize the strengths she shows working with goal-directed behaviors to move in a direction that supports her improved emotional health.  Goal review and progress/challenges noted with patient.  Next appointment within 2 weeks.  This record has been created using [employer].  Chart creation errors have been sought, but may not always have been located and corrected.  Such creation errors do not reflect on the standard of medical care provided.    Mathis Fare, LCSW

## 2021-06-28 ENCOUNTER — Ambulatory Visit (INDEPENDENT_AMBULATORY_CARE_PROVIDER_SITE_OTHER): Payer: BC Managed Care – PPO | Admitting: Psychiatry

## 2021-06-28 ENCOUNTER — Other Ambulatory Visit: Payer: Self-pay

## 2021-06-28 DIAGNOSIS — F411 Generalized anxiety disorder: Secondary | ICD-10-CM | POA: Diagnosis not present

## 2021-06-28 NOTE — Progress Notes (Signed)
[facility] Counselor/Therapist Progress Note  Patient ID: Mariah Young, MRN: [id number],    Date: 06/28/2021  Time Spent: 60 minutes   Treatment Type: Individual Therapy  Reported Symptoms: anxiety, depression  Mental Status Exam:  Appearance:   Casual and Neat     Behavior:  Appropriate, Sharing, and Motivated  Motor:  Normal  Speech/Language:   Clear and Coherent  Affect:  Depressed and anxious  Mood:  anxious and depressed  Thought process:  goal directed  Thought content:    obsessiveness  Sensory/Perceptual disturbances:    WNL  Orientation:  oriented to person, place, time/date, situation, day of week, month of year, year, and stated date of Dec. 8, 2022  Attention:  Good  Concentration:  Good  Memory:  WNL  Fund of knowledge:   Good  Insight:    Good and Fair  Judgment:   Good  Impulse Control:  Good   Risk Assessment: Danger to Self:  No Self-injurious Behavior: No Danger to Others: No Duty to Warn:no Physical Aggression / Violence:No  Access to Firearms a concern: No  Gang Involvement:No   Subjective:  Patient in today reporting anxiety, depression, overwhelmed, and tearfulness, all of which we focused on in session today. Stressors include her relationship with BF, new job training stress!, self-esteem, uncertainties in some upcoming changes including her new job and expectations. Reviewed more positive self-care and "not looking for what might go wrong versus right. Still trying to manage personal and relationship stressors more effectively and without as much anxiety. Working with her anxious/negative/depressive thoughts, challenging them and replacing them with more realistic and empowering thoughts that don't feed her anxiety and depression. Still working on some "control issues"' and being able to better tolerate when things are not in her control.Also working to decrease her overthinking and obsessiveness, and trying to improve task completion with  less procrastination.   Interventions: Cognitive Behavioral Therapy, Solution-Oriented/Positive Psychology, and Insight-Oriented  Treatment goal plan :  Patient not signing tx plan on computer screen due to Covid. Treatment Goals: Goals will remain on tx plan as patient works with strategies to achieve her goals. Progress will be noted each session and documented in "Progress" section of goal plan. Long term goal: (Measurable) 1. Stabilize anxiety level while increasing ability to function on a daily basis.  2. Patient will eventually progress to where she rates her anxiety as a "3" or less on "1-10 Anxiety scale" for at least 2 months. Short term goal: Increase understanding of beliefs and messages that produce anxiety, worry,fear, and negativity.  Strategies: Identify, challenge, and replace anxious/fearful/negative with positive, hopeful, and empoweriing self-talk.    Diagnosis:   ICD-10-CM   1. Generalized anxiety disorder  F41.1      Plan: Patient today showing motivation and active participation in session as she worked further on stressors within her relationship with boyfriend and family, intense new job training, self-esteem, uncertainties and some upcoming changes including her new job and expectations.  Did well in reviewing the positive self-care that we have worked on in prior sessions and also trying to change her automatic assumptions from being so negative and assuming the worst.  Patient encouraged in her practice of more positive behaviors including: Believing in herself more and that she can make significant changes she is needing to make, getting outside daily and walking with her dog, staying in the present focusing on what she can control, trying to stop jumping ahead and assuming worst case scenarios,  reaching out to people in her support network, good nutrition and exercise, interrupting and challenging/replacing anxious/negative/depressive/fearful thoughts to be more  reality based, spending more time with her dog which is very therapeutic for her, letting go of negative thoughts and assumptions that hold her back, continue working on adjusting more easily when things do not go as planned or are out of her control, stop self negating, practice more consistent positive self talk, intentionally look for more positives within herself daily, look each day for more positives versus negatives, practicing forgiveness of others and of herself, and feel good about the strength she shows working with goal-directed behaviors to move in a direction that supports improved emotional health.   Goal review and progress/challenges noted with patient.  Next appointment within 2 weeks.  This record has been created using [employer].  Chart creation errors have been sought, but may not always have been located and corrected.  Such creation errors do not reflect on the standard of medical care provided.   Mathis Fare, LCSW

## 2021-07-11 ENCOUNTER — Other Ambulatory Visit: Payer: Self-pay

## 2021-07-11 ENCOUNTER — Ambulatory Visit (INDEPENDENT_AMBULATORY_CARE_PROVIDER_SITE_OTHER): Payer: BC Managed Care – PPO | Admitting: Psychiatry

## 2021-07-11 DIAGNOSIS — F411 Generalized anxiety disorder: Secondary | ICD-10-CM

## 2021-07-11 NOTE — Progress Notes (Signed)
[facility] Counselor/Therapist Progress Note  Patient ID: Mariah Young, MRN: [id number],    Date: 07/11/2021  Time Spent: 58 minutes   Treatment Type: Individual Therapy  Reported Symptoms: anxiety, "overwhelmed" with training for new job  Mental Status Exam:  Appearance:   Casual     Behavior:  Appropriate, Sharing, and Motivated  Motor:  Normal  Speech/Language:   Clear and Coherent  Affect:  anxious  Mood:  anxious  Thought process:  goal directed  Thought content:    Some overthinking and obsessiveness  Sensory/Perceptual disturbances:    WNL  Orientation:  oriented to person, place, time/date, situation, day of week, month of year, year, and stated date of Dec. 21, 2022  Attention:  Good  Concentration:  Good and Fair  Memory:  WNL  Fund of knowledge:   Good  Insight:    Good and Fair  Judgment:   Good  Impulse Control:  Good   Risk Assessment: Danger to Self:  No Self-injurious Behavior: No Danger to Others: No Duty to Warn:no Physical Aggression / Violence:No  Access to Firearms a concern: No  Gang Involvement:No   Subjective: Patient in today reporting anxiety and feeling overwhelmed, both of which are mostly related to her current relationship, training for new job, and family relationships within her family and her BF's family. Discussed patient's concerns, fears, beliefs, and anxieties related to her family, relationship with BF, and especially int her intensive training for new job (training is intensive). Looking at what she can control and what she can't, not looking for things to go wrong versus right, giving people the benefit of the doubt and not pre-judge, being willing to forgive self and others, and going into situations looking and hoping for the best.  Patient did seem to be feeling calmer, more intuitive, and sensitive about some upcoming situations where she had been anticipating "worst case scenarios".  Did review positive self-care more  with her again this session.  Is trying to manage a lot of personal and work relationship stressors more effectively.  Confronting anxious/negative/depressive thoughts and challenging them to replace with more reality-based and empowering thoughts that do not feed her anxiety nor depression.  Definitely seems less depressed today and more anxious.  Continues to work on decreasing her overthinking and obsessiveness while improving her task completion and not letting procrastination get in the way of her being successful.  Interventions: Cognitive Behavioral Therapy, Solution-Oriented/Positive Psychology, and Insight-Oriented  Treatment goal plan :  Patient not signing tx plan on computer screen due to Covid. Treatment Goals: Goals will remain on tx plan as patient works with strategies to achieve her goals. Progress will be noted each session and documented in "Progress" section of goal plan. Long term goal: (Measurable) 1. Stabilize anxiety level while increasing ability to function on a daily basis.  2. Patient will eventually progress to where she rates her anxiety as a "3" or less on "1-10 Anxiety scale" for at least 2 months. Short term goal: Increase understanding of beliefs and messages that produce anxiety, worry,fear, and negativity.  Strategies: Identify, challenge, and replace anxious/fearful/negative with positive, hopeful, and empoweriing self-talk.  Diagnosis:   ICD-10-CM   1. Generalized anxiety disorder  F41.1      Plan: Patient today showing motivation and active engagement in session as we worked on her anxiety, tendency to look for what might go wrong versus right, prejudging people or events, giving people the benefit of the doubt especially when she  does not know them, and not making assumptions as to how things are going to turn out before they occur.  Patient encouraged in her practice of more positive behaviors including: Believing in herself more and that she can make  significant changes that are needed, getting outside daily and walking with her dog, staying in the present focusing on what she can control, trying to stop jumping ahead and assuming worst case scenarios, reaching out to people in her support network, good nutrition and exercise, interrupting and challenging/replacing anxious/negative/depressive/fearful thoughts to be more reality based, spending more time with her dog which is very therapeutic for her, letting go of negative thoughts and assumptions that hold her back, continue working on adjusting more easily when things do not go as planned or are out of her control, stop self negating, practice more consistent positive self talk, intentionally look for more positives within herself each day, look for more positives versus negatives daily, practice forgiveness of others and of herself, for every negative thought create 2 positives, and recognize the strengths she shows working with goal-directed behaviors to move in a direction that supports improved emotional health and overall stability.  Goal review and progress/challenges noted with patient.  Next appointment within 2 weeks.  This record has been created using [employer].  Chart creation errors have been sought, but may not always have been located and corrected.  Such creation errors do not reflect on the standard of medical care provided.  Mathis Fare, LCSW

## 2021-07-24 ENCOUNTER — Ambulatory Visit (INDEPENDENT_AMBULATORY_CARE_PROVIDER_SITE_OTHER): Payer: BC Managed Care – PPO | Admitting: Psychiatry

## 2021-07-24 ENCOUNTER — Other Ambulatory Visit: Payer: Self-pay

## 2021-07-24 DIAGNOSIS — F411 Generalized anxiety disorder: Secondary | ICD-10-CM

## 2021-07-24 NOTE — Progress Notes (Signed)
[facility] Counselor/Therapist Progress Note  Patient ID: Mariah Young, MRN: [id number],    Date: 07/24/2021  Time Spent: 55 minutes   Treatment Type: Individual Therapy  Reported Symptoms: anxiety, depression  Mental Status Exam:  Appearance:   Neat     Behavior:  Appropriate, Sharing, and Motivated  Motor:  Normal  Speech/Language:   Clear and Coherent  Affect:  Depressed and anxiety  Mood:  anxious and depressed  Thought process:  goal directed  Thought content:    Some overthinking  Sensory/Perceptual disturbances:    WNL  Orientation:  oriented to person, place, time/date, situation, day of week, month of year, year, and stated date of Jan. 3, 2023  Attention:  Good  Concentration:  Good  Memory:  WNL  Fund of knowledge:   Good  Insight:    Good  Judgment:   Good  Impulse Control:  Good   Risk Assessment: Danger to Self:  No Self-injurious Behavior: No Danger to Others: No Duty to Warn:no Physical Aggression / Violence:No  Access to Firearms a concern: No  Gang Involvement:No   Subjective:  Patient in today reporting anxiety and depression regarding work and personal/relationship concerns. Not feeling quite as overwhelmed as last session. "A lot of changes going on in my life" and processed these in more detail and with tearfulness, and did seem to feel more calm and relieved by session end. 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 Focused on what she can control and what she cannot control and to decrease her tendency to look for things to go wrong versus right., practicing self-forgiveness and forgiving others, trying not to pre-judge, approaching situations with more positive mindset, and feeling more self-confident. Reports she is trying to better manage relationship concerns and not jump too far ahead making assumptions that may not be accurate. Self-care  still important need for patient as she is juggling a lot of stress and challenges currently. Is making some progress in confronting anxious/negative/depressive thoughts and challenging them and replacing with more realistic and empowering thoughts that do not feed her anxiety nor depression. Holidays had some extra stress as she spent time for the first time with BF's extended family. Not as much overthinking nor obsessiveness, and seemed to be making some progress in her self esteem and self-confidence.   Interventions: Cognitive Behavioral Therapy and Insight-Oriented  Treatment goal plan :  Patient not signing tx plan on computer screen due to Covid. Treatment Goals: Goals will remain on tx plan as patient works with strategies to achieve her goals. Progress will be noted each session and documented in "Progress" section of goal plan. Long term goal: (Measurable) 1. Stabilize anxiety level while increasing ability to function on a daily basis.  2. Patient will eventually progress to where she rates her anxiety as a "3" or less on "1-10 Anxiety scale" for at least 2 months. Short term goal: Increase understanding of beliefs and messages that produce anxiety, worry,fear, and negativity.  Strategies: Identify, challenge, and replace anxious/fearful/negative with positive, hopeful, and empoweriing self-talk.   Diagnosis:   ICD-10-CM   1. Generalized anxiety disorder  F41.1      Plan: Patient today showing good motivation and actively involved in session as we worked on her anxiety and depression re: work Engineer, maintenance (IT) for new job, and personal/family relationships as noted above.  Showing improved self-confidence and working to stay more in the present  and not jump too far ahead. Patient encouraged in her practice of more positive behaviors including:  Recognizing some of her growth and progress, believing in herself more and that she can make significant changes in a more positive direction,  getting outside daily and walking with her dog, staying in the present focusing on what she can control, trying to stop jumping ahead and assuming worst case scenarios, reaching out to people in her support network, good nutrition and exercise, interrupting and challenging/replacing anxious/negative/depressive/fearful thoughts to be more reality based, spending more time with her dog which is very therapeutic for her, letting go of negative thoughts and assumptions that hold her back, continue working on adjusting more easily when things do not go as planned or are out of her control, stop self negating, practice more consistent positive self talk, intentionally look for positives within herself each day, looking for more positives versus negatives overall, practice forgiveness of others and of herself, for every negative thought create 2 positives, and feel good about the strength she shows working with goal-directed behaviors to move in a direction that supports improved emotional health.  Goal review and progress/challenges noted with patient.  Next appointment within 2 weeks.  This record has been created using [employer].  Chart creation errors have been sought, but may not always have been located and corrected.  Such creation errors do not reflect on the standard of medical care provided.   Mathis Fare, LCSW

## 2021-07-25 ENCOUNTER — Telehealth: Payer: Self-pay

## 2021-07-25 NOTE — Telephone Encounter (Signed)
Prior Authorization submitted and approved for AMPHETAMINE-DEXTROAMPHETAMINE ER effective 07/24/2021-07/22/2024 with [facility].

## 2021-07-27 ENCOUNTER — Telehealth: Payer: Self-pay | Admitting: Adult Health

## 2021-07-27 NOTE — Telephone Encounter (Signed)
Pt LVM asking for status of Prior Auth for her Adderall.  Next appt. 2/3

## 2021-07-27 NOTE — Telephone Encounter (Signed)
Tried calling patient to let her know about her PA but mailbox was full.

## 2021-07-30 ENCOUNTER — Telehealth: Payer: Self-pay | Admitting: Adult Health

## 2021-07-30 NOTE — Telephone Encounter (Signed)
Called patient again and mailbox is full.  

## 2021-07-30 NOTE — Telephone Encounter (Signed)
Patient called and she was told her PA had been approved.

## 2021-07-31 NOTE — Telephone Encounter (Signed)
Created in error

## 2021-08-08 ENCOUNTER — Ambulatory Visit (INDEPENDENT_AMBULATORY_CARE_PROVIDER_SITE_OTHER): Payer: BC Managed Care – PPO | Admitting: Psychiatry

## 2021-08-08 ENCOUNTER — Other Ambulatory Visit: Payer: Self-pay

## 2021-08-08 DIAGNOSIS — F411 Generalized anxiety disorder: Secondary | ICD-10-CM | POA: Diagnosis not present

## 2021-08-08 NOTE — Progress Notes (Signed)
[facility] Counselor/Therapist Progress Note  Patient ID: Mariah Young, MRN: [id number],    Date: 08/08/2021  Time Spent: 58 minutes   Treatment Type: Individual Therapy  Reported Symptoms: anxiety, depression, some obsessive thought, some tearfulness  Mental Status Exam:  Appearance:   Casual     Behavior:  Appropriate, Sharing, and Motivated  Motor:  Normal  Speech/Language:   Clear and Coherent  Affect:  Depressed and anxiety  Mood:  anxious and depressed  Thought process:  goal directed  Thought content:    Obsessions and overthinking  Sensory/Perceptual disturbances:    WNL  Orientation:  oriented to person, place, time/date, situation, day of week, month of year, year, and stated date of Jan. 18, 2023  Attention:  Good  Concentration:  Good/Fair  Memory:  WNL  Fund of knowledge:   Good  Insight:    Good and Fair  Judgment:   Good  Impulse Control:  Good   Risk Assessment: Danger to Self:  No Self-injurious Behavior: No Danger to Others: No Duty to Warn:no Physical Aggression / Violence:No  Access to Firearms a concern: No  Gang Involvement:No   Subjective:  Patient in today reporting anxiety and depression mostly due to personal/relationship and work concerns. 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 Talked through this and feeling some better realizing it had nothing to do with her role as she was observing what she was being trained on, but still upsetting for her to experience this for the first time.  Lots of stressors personally, with work, with family and relationships. 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 Trying to focus more on looking for more positive than negatives, not assuming the worst case scenarios, what she can change or control vs what she cannot, trying to have a more positive mindset, practicing self-forgiveness and forgiving others, not pre-judging others, positive self-care, interrupting anxious thoughts to replace with more empowering thoughts, and working to be more self-confident. Noticing her obsessiveness and overthinking accelerates when under increased stress.   Interventions: Solution-Oriented/Positive Psychology, Ego-Supportive, and Insight-Oriented  Treatment goal plan :  Patient not signing tx plan on computer screen due to Covid. Treatment Goals: Goals will remain on tx plan as patient works with strategies to achieve her goals. Progress will be noted each session and documented in "Progress" section of goal plan. Long term goal: (Measurable) 1. Stabilize anxiety level while increasing ability to function on a daily basis.  2. Patient will eventually progress to where she rates her anxiety as a "3" or less on "1-10 Anxiety scale" for at least 2 months. Short term goal: Increase understanding of beliefs and messages that produce anxiety, worry,fear, and negativity.  Strategies: Identify, challenge, and replace anxious/fearful/negative with positive, hopeful, and empoweriing self-talk.  Diagnosis:   ICD-10-CM   1. 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 Also shared and worked on her concerns with her apt complex and a "big issue with multiple mice over a period of time at her apt" as noted above . Patient encouraged in her practice of more positive behaviors including: Reflecting on her progress frequently, recognizing more of her growth and progress, believing in herself more and that she can make significant changes in a more positive direction, getting outside daily and walking with her dog, staying in the present focusing on what she can control, tried to stop jumping ahead and assuming worst case scenarios, reaching out to people in her support network, good nutrition and exercise, interrupting and challenging/replacing anxious/negative/depressive/fearful thoughts to be more reality based, spending more free time with her dog which is very therapeutic for her, letting go of negative thoughts and assumptions that hold her back, continue working on adjusting more easily when things do not go as planned or are out of her control, stop self negating, practice more consistent positive self talk, intentionally look for positives within herself each day and be able to list them, practice forgiveness of others and of herself, for every negative thought create 2 positives, and recognize the strengths she shows working with goal-directed behaviors to move in a direction that supports improved emotional health.  Goal review and progress/challenges noted with patient.  Next appointment within 2 weeks.  This record has been created using [employer].  Chart creation errors have been sought, but may not always have been located and corrected.  Such creation errors do not reflect on the standard of medical care provided.  Mathis Fare, LCSW

## 2021-08-21 ENCOUNTER — Ambulatory Visit (INDEPENDENT_AMBULATORY_CARE_PROVIDER_SITE_OTHER): Payer: BC Managed Care – PPO | Admitting: Psychiatry

## 2021-08-21 ENCOUNTER — Other Ambulatory Visit: Payer: Self-pay

## 2021-08-21 DIAGNOSIS — F411 Generalized anxiety disorder: Secondary | ICD-10-CM

## 2021-08-21 NOTE — Progress Notes (Signed)
[facility] Counselor/Therapist Progress Note  Patient ID: Mariah Young, MRN: [id number],    Date: 08/21/2021  Time Spent: 58 minutes   Treatment Type: Individual Therapy  Reported Symptoms: anxiety, depression   Mental Status Exam:  Appearance:   Casual     Behavior:  Appropriate, Sharing, and Motivated  Motor:  Normal  Speech/Language:   Clear and Coherent  Affect:  Depressed and anxious  Mood:  anxious and depressed  Thought process:  goal directed  Thought content:    Some overthinking and obsessiveness  Sensory/Perceptual disturbances:    WNL  Orientation:  oriented to person, place, time/date, situation, day of week, month of year, year, and stated date of Jan.31, 2023  Attention:  Fair  Concentration:  Good and Fair  Memory:  WNL  Fund of knowledge:   Good  Insight:    Good and Fair  Judgment:   Good  Impulse Control:  Good   Risk Assessment: Danger to Self:  No Self-injurious Behavior: No Danger to Others: No Duty to Warn:no Physical Aggression / Violence:No  Access to Firearms a concern: No  Gang Involvement:No   Subjective:  Patient in today reporting anxiety and depression related to work concerns and also personal/relationship issues. States "I need to work on believing in myself and trust that I'll be happy in the future." Some obsessiveness and overthinking continues. Very stressed to day with her intensive training for new job. Became tearful and processed the challenges and "worries" she is experiencing. She recognized later that she is tending to focus more on self-doubt and "what might go wrong versus right", although so far she is getting through the work training within the time allowed and passing periodic tests. 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 Also worked on some family relationship issues and ongoing  problems at her apt which is heightening her overall stress. Catching herself with the "negatives" and trying to be more positive and believe in herself versus making negative assumptions. Focusing more on "what I can change and accept what I can't", not being judgmental, trying to become more confident in myself, and exercising more forgiveness of others and myself. More calm and grounded by session end, expressing some more hopefulness mixed with some apprehension in making decisions.   Interventions: Solution-Oriented/Positive Psychology, Ego-Supportive, and Insight-Oriented  Treatment goal plan :  Patient not signing tx plan on computer screen due to Covid. Treatment Goals: Goals will remain on tx plan as patient works with strategies to achieve her goals. Progress will be noted each session and documented in "Progress" section of goal plan. Long term goal: (Measurable) 1. Stabilize anxiety level while increasing ability to function on a daily basis.  2. Patient will eventually progress to where she rates her anxiety as a "3" or less on "1-10 Anxiety scale" for at least 2 months. Short term goal: Increase understanding of beliefs and messages that produce anxiety, worry,fear, and negativity.  Strategies: Identify, challenge, and replace anxious/fearful/negative with positive, hopeful, and empoweriing self-talk.   Diagnosis:   ICD-10-CM   1. Generalized anxiety disorder  F41.1      Plan: Patient today showing good motivation and active participation in session today focusing on her anxiety, fearful thinking, work stress, apt issues, relationship concerns, and depression. Recognized tendency to have more self-doubt and looking for what "might go wrong versus right."  Tearfully shared and processed her feelings related to the above-stated concerns. Work training is rigorous but she states this is the field that she is wanting to be in. Encouraged patient in practicing more positive behaviors  including: Reflecting on her progress more often, recognizing more of her growth and progress, believing in herself more and that she can make significant changes in more positive directions, getting outside daily and walking with her dog, staying in the present focusing on what she can control, trying to stop jumping ahead and assuming worst case scenarios, reaching out to people in her support network, good nutrition and exercise, interrupting and challenging/replacing her anxious/negative/depressive/fearful thoughts to be more reality based in her thinking, spending more free time with her dog which is very therapeutic for her, letting go of negative thoughts and assumptions that hold her back, continue working on adjusting more easily when things do not go as planned or are out of her control, stop self negating, practice more consistent positive self talk, intentionally look for positives within herself each day and be able to name them, practice forgiveness of others and of herself, for every negative thought create 2 positives, and feel good about the strength she shows working with goal-directed behaviors to move in a direction that supports improved emotional health.  Goal review and progress/challenges noted with patient.  Next appointment within 2 to 3 weeks.  This record has been created using [employer].  Chart creation errors have been sought, but may not always have been located and corrected.  Such creation errors do not reflect on the standard of medical care provided.   Shanon Ace, LCSW

## 2021-08-24 ENCOUNTER — Encounter: Payer: Self-pay | Admitting: Adult Health

## 2021-08-24 ENCOUNTER — Other Ambulatory Visit: Payer: Self-pay

## 2021-08-24 ENCOUNTER — Ambulatory Visit (INDEPENDENT_AMBULATORY_CARE_PROVIDER_SITE_OTHER): Payer: BC Managed Care – PPO | Admitting: Adult Health

## 2021-08-24 DIAGNOSIS — F422 Mixed obsessional thoughts and acts: Secondary | ICD-10-CM | POA: Diagnosis not present

## 2021-08-24 DIAGNOSIS — F410 Panic disorder [episodic paroxysmal anxiety] without agoraphobia: Secondary | ICD-10-CM | POA: Diagnosis not present

## 2021-08-24 DIAGNOSIS — F411 Generalized anxiety disorder: Secondary | ICD-10-CM | POA: Diagnosis not present

## 2021-08-24 DIAGNOSIS — F902 Attention-deficit hyperactivity disorder, combined type: Secondary | ICD-10-CM | POA: Diagnosis not present

## 2021-08-24 MED ORDER — DESVENLAFAXINE SUCCINATE ER 100 MG PO TB24: 100.0000 mg | ORAL_TABLET | Freq: Every day | ORAL | 5 refills | Status: DC

## 2021-08-24 MED ORDER — AMPHETAMINE-DEXTROAMPHET ER 25 MG PO CP24: ORAL_CAPSULE | ORAL | 0 refills | Status: DC

## 2021-08-24 NOTE — Progress Notes (Signed)
Mariah Young DA:[id number] [birth year] 26 y.o.  Subjective:   Patient ID:  Mariah Young is a 26 y.o. (DOB 06-03-1996) female.  Chief Complaint: No chief complaint on file.   HPI Mariah Young presents to the office today for follow-up of ADHD, panic disorder, GAD, and obsessional thoughts.   Describes mood today as "ok". Flat. Decreased tearfulness. Mood symptoms - reports depression, anxiety, and irritability. Denies panic attacks. Stating "I'm in an adjustment period". Started a new job and is in a transition period - "learning a lot". She and partner doing well - traveling back and forth to visit each other on the weekend. Improved interest and motivation. Working with therapist - Rinaldo Cloud. Taking medications as prescribed. Energy levels stable. Active, does not have a regular exercise routine.  Enjoys some usual interests and activities. In a relationship. Lives alone with dog - "[name]". Family in [place]. Appetite adequate. Weight gain - a few pounds. Sleep is improving. Averages 6 to 8 hours - not feeling as rested.  Focus and concentration stable. Completing tasks. Managing aspects of household. Has started a new job and is still adjusting.  Denies SI or HI.  Denies AH or VH.  Previous medication trials: Prozac    GAD-7    Flowsheet Row Office Visit from 11/14/2020 in [facility]  Total GAD-7 Score 11      PHQ2-9    Scissors Office Visit from 11/14/2020 in [facility]  PHQ-2 Total Score 3  PHQ-9 Total Score 14      Flowsheet Row ED from 03/08/2021 in [facility]  ED from 02/22/2021 in [facility] [place] [facility] from 11/02/2020 in [facility]  C-SSRS RISK CATEGORY No Risk No Risk No Risk        Review of Systems:  Review of Systems  Musculoskeletal:  Negative for gait problem.   Neurological:  Negative for tremors.  Psychiatric/Behavioral:         Please refer to HPI   Medications: I have reviewed the patient's current medications.  Current Outpatient Medications  Medication Sig Dispense Refill   desvenlafaxine (PRISTIQ) 100 MG 24 hr tablet Take 1 tablet (100 mg total) by mouth daily. 30 tablet 5   ALPRAZolam (XANAX) 1 MG tablet Take 1 tablet (1 mg total) by mouth 2 (two) times daily as needed for anxiety. 60 tablet 2   amphetamine-dextroamphetamine (ADDERALL XR) 25 MG 24 hr capsule Take two capsules by mouth daily after breakfast. 60 capsule 0   amphetamine-dextroamphetamine (ADDERALL XR) 25 MG 24 hr capsule Take 2 capsules by mouth daily after breakfast. 60 capsule 0   amphetamine-dextroamphetamine (ADDERALL XR) 25 MG 24 hr capsule Take two capsules by mouth daily after breakfast. 60 capsule 0   amphetamine-dextroamphetamine (ADDERALL) 20 MG tablet Take 1 tablet (20 mg total) by mouth daily. 30 tablet 0   amphetamine-dextroamphetamine (ADDERALL) 20 MG tablet Take 1 tablet (20 mg total) by mouth daily before supper. 30 tablet 0   amphetamine-dextroamphetamine (ADDERALL) 20 MG tablet Take 1 tablet (20 mg total) by mouth daily. 30 tablet 0   Multiple Vitamin (ONE-DAILY MULTI-VITAMIN) TABS Take 1 tablet by mouth daily.     No current facility-administered medications for this visit.    Medication Side Effects: None  Allergies:  Allergies  Allergen Reactions   Clarithromycin Other (See Comments) and Rash    Vasculitis Vasculitis  Other reaction(s): Unknown  Penicillins Other (See Comments)    Other reaction(s): Intolerance vasculitis vasculitis    Penicillin G     Other reaction(s): Unknown    Past Medical History:  Diagnosis Date   Acute non-recurrent maxillary sinusitis 07/12/2017   ADHD (attention deficit hyperactivity disorder)    Anxiety    Cough 07/12/2017   Diaphoresis    Dysmenorrhea    IUD (intrauterine device) in place    Obesity  (BMI 30.0-34.9)    Obsessive compulsive disorder     Past Medical History, Surgical history, Social history, and Family history were reviewed and updated as appropriate.   Please see review of systems for further details on the patient's review from today.   Objective:   Physical Exam:  There were no vitals taken for this visit.  Physical Exam Constitutional:      General: She is not in acute distress. Musculoskeletal:        General: No deformity.  Neurological:     Mental Status: She is alert and oriented to person, place, and time.     Coordination: Coordination normal.  Psychiatric:        Attention and Perception: Attention and perception normal. She does not perceive auditory or visual hallucinations.        Mood and Affect: Mood normal. Mood is not anxious or depressed. Affect is not labile, blunt, angry or inappropriate.        Speech: Speech normal.        Behavior: Behavior normal.        Thought Content: Thought content normal. Thought content is not paranoid or delusional. Thought content does not include homicidal or suicidal ideation. Thought content does not include homicidal or suicidal plan.        Cognition and Memory: Cognition and memory normal.        Judgment: Judgment normal.     Comments: Insight intact    Lab Review:     Component Value Date/Time   NA 140 05/14/2021 1049   K 4.6 05/14/2021 1049   CL 101 05/14/2021 1049   CO2 22 05/14/2021 1049   GLUCOSE 90 05/14/2021 1049   GLUCOSE 95 11/14/2020 1007   BUN 12 05/14/2021 1049   CREATININE 0.70 05/14/2021 1049   CALCIUM 10.0 05/14/2021 1049   PROT 7.7 05/14/2021 1049   ALBUMIN 5.1 (H) 05/14/2021 1049   AST 17 05/14/2021 1049   ALT 13 05/14/2021 1049   ALKPHOS 76 05/14/2021 1049   BILITOT 0.2 05/14/2021 1049   GFRNONAA >60 11/14/2020 1007       Component Value Date/Time   WBC 7.5 05/14/2021 1049   WBC 10.5 11/02/2020 0828   RBC 4.85 05/14/2021 1049   RBC 4.28 11/02/2020 0828   HGB 15.1  05/14/2021 1049   HCT 44.1 05/14/2021 1049   PLT 256 11/02/2020 0828   MCV 91 05/14/2021 1049   MCH 31.1 05/14/2021 1049   MCH 30.4 11/02/2020 0828   MCHC 34.2 05/14/2021 1049   MCHC 34.1 11/02/2020 0828   RDW 12.6 05/14/2021 1049   LYMPHSABS 2.3 05/14/2021 1049   MONOABS 0.6 11/02/2020 0828   EOSABS 0.0 05/14/2021 1049   BASOSABS 0.0 05/14/2021 1049    No results found for: POCLITH, LITHIUM   No results found for: PHENYTOIN, PHENOBARB, VALPROATE, CBMZ   .res Assessment: Plan:    Plan:  PDMP reviewed  Continue:  1. Adderall XR 25mg  2. Adderall 20mg  daily - not using as much 3. Xanax 1mg  BID 4. D/C  Pristiq 50mg  daily 5. D/C Pristiq 25mg  daily 6. Add Pristiq 100mg  every morning   RTC 3 months  Patient advised to contact office with any questions, adverse effects, or acute worsening in signs and symptoms.  Discussed potential benefits, risk, and side effects of benzodiazepines to include potential risk of tolerance and dependence, as well as possible drowsiness.  Advised patient not to drive if experiencing drowsiness and to take lowest possible effective dose to minimize risk of dependence and tolerance.  Discussed potential benefits, risks, and side effects of stimulants with patient to include increased heart rate, palpitations, insomnia, increased anxiety, increased irritability, or decreased appetite.  Instructed patient to contact office if experiencing any significant tolerability issues. Diagnoses and all orders for this visit:  Generalized anxiety disorder -     desvenlafaxine (PRISTIQ) 100 MG 24 hr tablet; Take 1 tablet (100 mg total) by mouth daily.  Attention deficit hyperactivity disorder (ADHD), combined type, moderate -     amphetamine-dextroamphetamine (ADDERALL XR) 25 MG 24 hr capsule; Take two capsules by mouth daily after breakfast.  Panic disorder  Mixed obsessional thoughts and acts -     desvenlafaxine (PRISTIQ) 100 MG 24 hr tablet; Take 1  tablet (100 mg total) by mouth daily.     Please see After Visit Summary for patient specific instructions.  Future Appointments  Date Time Provider [place]  09/04/2021  5:00 PM Shanon Ace, LCSW [facility] None  09/18/2021  5:00 PM Shanon Ace, LCSW [facility] None  09/26/2021  5:00 PM Shanon Ace, LCSW [facility] None  10/29/2021  5:00 PM Shanon Ace, LCSW [facility] None  11/12/2021  5:00 PM Shanon Ace, LCSW [facility] None  11/20/2021  5:00 PM Shanon Ace, LCSW [facility] None  12/04/2021  5:00 PM Shanon Ace, LCSW [facility] None  05/16/2022  3:10 PM Early, Coralee Pesa, NP [facility]    No orders of the defined types were placed in this encounter.   -------------------------------

## 2021-09-04 ENCOUNTER — Other Ambulatory Visit: Payer: Self-pay

## 2021-09-04 ENCOUNTER — Ambulatory Visit (INDEPENDENT_AMBULATORY_CARE_PROVIDER_SITE_OTHER): Payer: BC Managed Care – PPO | Admitting: Psychiatry

## 2021-09-04 DIAGNOSIS — F411 Generalized anxiety disorder: Secondary | ICD-10-CM

## 2021-09-04 NOTE — Progress Notes (Signed)
[facility] Counselor/Therapist Progress Note  Patient ID: Mariah Young, MRN: [id number],    Date: 09/04/2021  Time Spent: 57 minutes   Treatment Type: Individual Therapy  Reported Symptoms: anxiety, depression  Mental Status Exam:  Appearance:   Casual     Behavior:  Appropriate, Sharing, and Motivated  Motor:  Normal  Speech/Language:   Clear and Coherent  Affect:  Depressed and anxious  Mood:  anxious and depressed  Thought process:  Some overthinking  Thought content:    Obsessions and some overthinking  Sensory/Perceptual disturbances:    WNL  Orientation:  oriented to person, place, time/date, situation, day of week, month of year, year, and stated date of Feb. 14, 2023  Attention:  Good  Concentration:  Good  Memory:  WNL  Fund of knowledge:   Good  Insight:    Good  Judgment:   Good  Impulse Control:  Fair   Risk Assessment: Danger to Self:  No Self-injurious Behavior: No Danger to Others: No Duty to Warn:no Physical Aggression / Violence:No  Access to Firearms a concern: No  Gang Involvement:No   Subjective: Patient in for session today reporting anxiety and depression, "wondering what is wrong with me?"  "Why can't shake the feelings of I'm failing?" Recognizing some her tendency to assume the worst and difficulty staying in the present, both of which we worked on in session. We worked with specific examples in patient's like where both of these concerns tend to play out, which helped patient see more clearly and be less judgmental of herself. Tearfully admits her stress level has been higher more recently due to some stressful training for work. We reflected on her success thus far in her work training. Encouraged much more positive self-talk and believing in herself. After venting, processing, and releasing tears, patient was much more calm and grounded.  Interventions: Solution-Oriented/Positive Psychology and Insight-Oriented  Treatment goal plan :   Patient not signing tx plan on computer screen due to Covid. Treatment Goals: Goals will remain on tx plan as patient works with strategies to achieve her goals. Progress will be noted each session and documented in "Progress" section of goal plan. Long term goal: (Measurable) 1. Stabilize anxiety level while increasing ability to function on a daily basis.  2. Patient will eventually progress to where she rates her anxiety as a "3" or less on "1-10 Anxiety scale" for at least 2 months. Short term goal: Increase understanding of beliefs and messages that produce anxiety, worry,fear, and negativity.  Strategies: Identify, challenge, and replace anxious/fearful/negative with positive, hopeful, and empoweriing self-talk.  Diagnosis:   ICD-10-CM   1. Generalized anxiety disorder  F41.1      Plan: Patient in today showing good motivation and actively participated in session focusing on her anxiety, fearful thinking, self doubt, issues that are stressing her at her apartment, work stressors, relationship concerns and depression.  These issues do tend to be interrelated and patient did well in confronting them in session today. Continues to try and decrease her assuming worst case scenarios and looking for what might go wrong versus right.  Her training for her new job is demanding and patient will find herself sometimes doubting herself or assuming "the worst", but states that she does not doubt but what this is still the field she wants to be in.  Worked well in processing her stressors, mostly work-related, and releasing a lot of tears that have been building up.  As noted above she  was much more calm and grounded by the end of session, seeming to feel more self confident upon leaving session. Encouraged patient in her practice of more positive behaviors including: Recognizing and reflecting more often on her progress and personal growth, believing in herself more and that she can make significant  changes in more positive directions, getting outside daily as weather permits, walking some daily, spending time with her dog as she is very therapeutic for patient, staying in the present focusing on what she can control, trying to stop jumping ahead and assuming worst case scenarios, reaching out to people in her support network, good nutrition and exercise, interrupting and challenging/replacing her anxious/negative/depressive/fearful thoughts to be more reality based in her thinking, letting go of negative thoughts and assumptions that hold her back, continue working on adjusting more easily when things do not go as planned or are out of her control, stop self negating, practice more consistent positive self talk, intentionally look for positives within herself daily and be able to name them, practice forgiveness of others and of herself, for every negative thought create 2 positives, and recognize the strengths she shows working with goal-directed behaviors to move in a direction that supports improved emotional health and overall wellbeing.  Goal review and progress/challenges noted with patient.  Next appointment within 2 weeks.  This record has been created using [employer].  Chart creation errors have been sought, but may not always have been located and corrected.  Such creation errors do not reflect on the standard of medical care provided.   Mathis Fare, LCSW

## 2021-09-18 ENCOUNTER — Other Ambulatory Visit: Payer: Self-pay

## 2021-09-18 ENCOUNTER — Ambulatory Visit (INDEPENDENT_AMBULATORY_CARE_PROVIDER_SITE_OTHER): Payer: BC Managed Care – PPO | Admitting: Psychiatry

## 2021-09-18 DIAGNOSIS — F411 Generalized anxiety disorder: Secondary | ICD-10-CM

## 2021-09-18 NOTE — Progress Notes (Signed)
[facility] Counselor/Therapist Progress Note  Patient ID: Mariah Young, MRN: [id number],    Date: 09/18/2021  Time Spent: 55 minutes   Treatment Type: Individual Therapy  Reported Symptoms: anxiety, depression  Mental Status Exam:  Appearance:   Casual     Behavior:  Appropriate, Sharing, and Motivated  Motor:  Normal  Speech/Language:   Clear and Coherent  Affect:  Depressed and anxious  Mood:  anxious and depressed  Thought process:  goal directed  Thought content:    Obsessions and overthinking  Sensory/Perceptual disturbances:    WNL  Orientation:  oriented to person, place, time/date, situation, day of week, month of year, year, and stated date of Feb. 28, 2023  Attention:  Good  Concentration:  Good  Memory:  WNL  Fund of knowledge:   Good  Insight:    Good  Judgment:   Good  Impulse Control:  Good   Risk Assessment: Danger to Self:  No Self-injurious Behavior: No Danger to Others: No Duty to Warn:no Physical Aggression / Violence:No  Access to Firearms a concern: No  Gang Involvement:No   Subjective:  Patient in today reporting anxiety and depression. Overwhelmed at work and still in training. Passed most recent test which allows her to move forward. "Letting my anxiety talk me into believing I can't do certain things."  Doubting herself in her training for work position, not feeling like she's doing well in everything as "I'm not following through on everything I feel like I need to be doing more. Overwhelmed with work and training recently and needing to take better care of herself. Shares some relationship concerns due to a "pivotal moment".  Patient shared the difficult conversation she and BF had in which he was questioning her being on meds and other issues that she took personally, and felt judged. Further processed her concerns about their conversation and talking later to share their love for each other and did seem to eventually reach a better place  and wanting to work together to move forward, and feel more free to share their doubts/insecurities that may come up at times. Positive self-care and positive self-talk encouraged. Encouraged her belief in herself and feeling good about how she is managing some volatility in her life and in relationship.   Interventions: Solution-Oriented/Positive Psychology and Insight-Oriented   Treatment goal plan :  Patient not signing tx plan on computer screen due to Covid. Treatment Goals: Goals will remain on tx plan as patient works with strategies to achieve her goals. Progress will be noted each session and documented in "Progress" section of goal plan. Long term goal: (Measurable) 1. Stabilize anxiety level while increasing ability to function on a daily basis.  2. Patient will eventually progress to where she rates her anxiety as a "3" or less on "1-10 Anxiety scale" for at least 2 months. Short term goal: Increase understanding of beliefs and messages that produce anxiety, worry,fear, and negativity.  Strategies: Identify, challenge, and replace anxious/fearful/negative with positive, hopeful, and empoweriing self-talk.    Diagnosis:   ICD-10-CM   1. Generalized anxiety disorder  F41.1      Plan:  Patient in today with good motivation and engagement in session working on her self doubt, anxiety, fearful thinking, and stress management.  Symptoms are mostly related to work stressors, personal stressors, relationship concerns, and issues at her apartment. Needed to focus more today on the challenges she is facing in her work environment/training and some issues in the relationship  with her boyfriend as the 2 of them have been getting more serious in their relationship.  Although difficult, patient describes how she has handled the challenges both at work and with her boyfriend rather well, showing lots of strength in addition to some fear, and lots of compassion and understanding as well.  It seems  that her and her boyfriend are actually pulling together more than what she had feared could be a potential break-up.  Patient reports that she is continuing to work on not assuming worst case scenarios and always looking for what might go wrong versus right.  She is starting to recognize how that sets her up to be more negative and less trusting in some circumstances.  Also realizing how the demanding training that she is involved in for a new job is placing significant stress on her and is working on better ways of managing that stress, per our discussion in session today.  Expressed a lot of tears in session and explained towards the end that they were really 2 years more of relief in being able to talk through some things that she had held in most recently.  Really wanted to work harder on not assuming the worst, believing in herself more and trusting herself and handling the many stressors that she is handling right now, with more emphasis on increasing her self acceptance and overall self-care.  Encouraged patient in her practice of more positive behaviors including: Reflecting more often on her progress and personal growth, believing in herself more and that she can make significant changes in more positive directions, getting outside daily as weather permits, walking some each day, spending time with her dog as this is very therapeutic for patient, staying in the present focusing on what she can control, trying to stop jumping ahead and assuming worst case scenarios, reaching out to people in her support network, good nutrition and exercise, interrupting and challenging/replacing her anxious/negative/depressive/fearful thoughts to be more reality based in her thinking, letting go of negative thoughts and assumptions that hold her back, continue working on adjusting more easily when things do not go as planned or are out of her control, stop self negating, practice more consistent positive self talk,  intentionally look for positives within herself daily and be able to name them, practice forgiveness of others and of herself, for every negative thought create 2 positives, and realize the strengths she shows working with goal-directed behaviors to move in a direction that supports her improved emotional health.  Goal review and progress/challenges noted with patient.  Next appointment within 2 weeks.  This record has been created using [employer].  Chart creation errors have been sought, but may not always have been located and corrected.  Such creation errors do not reflect on the standard of medical care provided.    Mathis Fare, LCSW

## 2021-09-26 ENCOUNTER — Ambulatory Visit (INDEPENDENT_AMBULATORY_CARE_PROVIDER_SITE_OTHER): Payer: BC Managed Care – PPO | Admitting: Psychiatry

## 2021-09-26 ENCOUNTER — Other Ambulatory Visit: Payer: Self-pay

## 2021-09-26 DIAGNOSIS — F411 Generalized anxiety disorder: Secondary | ICD-10-CM

## 2021-09-26 NOTE — Progress Notes (Signed)
?    [facility] Counselor/Therapist Progress Note ? ?Patient ID: Mariah Young, MRN: [id number],   ? ?Date: 09/26/2021 ? ?Time Spent: 57 minutes  ? ?Treatment Type: Individual Therapy ? ?Reported Symptoms: anxiety, stressed, depression ("decreased some") ? ?Mental Status Exam: ? ?Appearance:   Casual     ?Behavior:  Appropriate, Sharing, and Motivated  ?Motor:  Normal  ?Speech/Language:   Clear and Coherent  ?Affect:  anxious  ?Mood:  anxious and some depression  ?Thought process:  goal directed  ?Thought content:    Some obsessiveness and overthinking  ?Sensory/Perceptual disturbances:    WNL  ?Orientation:  oriented to person, place, time/date, situation, day of week, month of year, year, and stated date of September 26, 2021  ?Attention:  Good  ?Concentration:  Good  ?Memory:  WNL  ?Fund of knowledge:   Good  ?Insight:    Good  ?Judgment:   Good  ?Impulse Control:  Good  ? ?Risk Assessment: ?Danger to Self:  No ?Self-injurious Behavior: No ?Danger to Others: No ?Duty to Warn:no ?Physical Aggression / Violence:No  ?Access to Firearms a concern: No     ?Gang Involvement:No  ? ?Subjective:   Patient in today reporting depression and anxiety, with anxiety being her stronger symptom. Depression decreased some. Work training is rigorous she and feels that is adding to her anxiety and stress. Working more with her self-doubt today, especially as it surfaces often in her work training, and is feeling some better. Is needing to elevate her self-care to include more frequent positive self-talk, good physical care of herself, allow for good sleep pattern, believing in herself more, and feel more self-assured in how she is trying to manage her stress and uncertainty more effectively. ? ?Interventions: Solution-Oriented/Positive Psychology, Ego-Supportive, and Insight-Oriented ? ?Treatment goal plan :  ?Patient not signing tx plan on computer screen due to Covid. ?Treatment Goals: ?Goals will remain on tx plan as patient works  with strategies to achieve her goals. Progress will be noted each session and documented in "Progress" section of goal plan. ?Long term goal: (Measurable) ?1. Stabilize anxiety level while increasing ability to function on a daily basis.  ?2. Patient will eventually progress to where she rates her anxiety as a "3" or less on "1-10 Anxiety scale" for at least 2 months. ?Short term goal: ?Increase understanding of beliefs and messages that produce anxiety, worry,fear, and negativity.  ?Strategies: ?Identify, challenge, and replace anxious/fearful/negative with positive, hopeful, and empoweriing self-talk. ? ?Diagnosis: ?  ICD-10-CM   ?1. Generalized anxiety disorder  F41.1   ?  ? ?Plan:  Patient in for appointment today with good motivation and active participation in session as she continues to work on her anxiety, stress management, depression, fearful thinking, relationship issues, and self doubt.  Symptoms are related to personal and relationship concerns, issues at her apartment, and work-related stressors.  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  She is a little over half way through the training and stress and anxiety have increased and patient is showing some increased strength but also struggling with some of the challenges.  Relationship would be if has also had a couple of challenges but they were able to talk through things and seemed to have arrived at a better place in the relationship.  Trying to  not assume worst case scenarios and trying to look more for things to go in a positive direction.  These are challenges for patient and she is working on them as she is gaining some insight as to how negative habits set her up to be more negative, less trusting, and in some situations doubting herself more.  Encouraged her to  look also at what can help set her up to be more positive, more trusting, and less doubting of herself which we discussed some in session today.  Due to the demands of her training for her new work position, we also focused on some more positive ways of practicing better self-care and managing the stress in that part of her life. Encouraged patient in her practice of more positive behaviors including: Believing in herself more and that she can make significant changes in more positive directions, reflecting more on her progress and personal growth daily, getting outside daily as weather permits, walking some each day, spending time with her dog as this is very therapeutic for patient, staying in the present focusing on what she can control, trying to stop jumping ahead and assuming worst case scenarios, reaching out to people in her support network, good nutrition and exercise, interrupting and challenging/replacing her anxious/negative/depressive/fearful thoughts to be more reality based in her thinking, let go of negative thoughts and assumptions that hold her back, continue working on adjusting more easily when things do not go as planned or are out of her control, stop self negating, practice more consistent positive self talk, intentionally look for positives within herself daily and be able to name them, practice forgiveness of others and of herself, for every negative thought create 2 positives, and recognize the strength she shows working with goal-directed behaviors to move in a direction that supports her improved emotional health. ? ?Goal review and progress/challenges noted with patient. ? ?Next appointment within 2 weeks. ? ?This record has been created using [employer].  Chart creation errors have been sought, but may not always have been located and corrected.  Such creation errors do not reflect on the standard of medical care provided. ? ? ?Mathis Fare,  LCSW ? ? ? ? ? ? ? ? ? ? ? ? ? ? ? ? ? ? ?

## 2021-09-27 ENCOUNTER — Ambulatory Visit (INDEPENDENT_AMBULATORY_CARE_PROVIDER_SITE_OTHER): Payer: BC Managed Care – PPO | Admitting: Adult Health

## 2021-09-27 ENCOUNTER — Encounter: Payer: Self-pay | Admitting: Adult Health

## 2021-09-27 DIAGNOSIS — F410 Panic disorder [episodic paroxysmal anxiety] without agoraphobia: Secondary | ICD-10-CM

## 2021-09-27 DIAGNOSIS — F422 Mixed obsessional thoughts and acts: Secondary | ICD-10-CM

## 2021-09-27 DIAGNOSIS — F411 Generalized anxiety disorder: Secondary | ICD-10-CM

## 2021-09-27 DIAGNOSIS — F902 Attention-deficit hyperactivity disorder, combined type: Secondary | ICD-10-CM

## 2021-09-27 MED ORDER — AMPHETAMINE-DEXTROAMPHETAMINE 20 MG PO TABS: 20.0000 mg | ORAL_TABLET | Freq: Every day | ORAL | 0 refills | Status: DC

## 2021-09-27 MED ORDER — AMPHETAMINE-DEXTROAMPHET ER 25 MG PO CP24: 50.0000 mg | ORAL_CAPSULE | Freq: Every day | ORAL | 0 refills | Status: DC

## 2021-09-27 NOTE — Progress Notes (Signed)
Mariah Young Court [id number] 02/19/1996 26 y.o.  Subjective:   Patient ID:  Mariah Young is a 26 y.o. (DOB 02/19/1996) female.  Chief Complaint: No chief complaint on file.   HPI Mariah Young presents to the office today for follow-up of ADHD, panic disorder, GAD, and obsessional thoughts.   Describes mood today as "ok". Flat. Decreased tearfulness. Mood symptoms - reports decreased depression, anxiety, and irritability. Denies panic attacks. Stating "I'm doing well". Feels like the increase in Pristiq has been helpful. She and partner doing well. Improved interest and motivation. Working with therapist - Rockne Menghiniebbie Dowd. Taking medications as prescribed. Energy levels stable. Active, does not have a regular exercise routine.  Enjoys some usual interests and activities. In a relationship. Lives alone with dog - "[name]". [place]. Appetite adequate. Weight stable. Sleep is improving. Averages 6 to 8 hours - recently got a new bed. Focus and concentration stable. Completing tasks. Managing aspects of household. Working full time.  Denies SI or HI.  Denies AH or VH.  Previous medication trials: Prozac   GAD-7    Flowsheet Row Office Visit from 11/14/2020 in [facility]  Total GAD-7 Score 11      PHQ2-9    Flowsheet Row Office Visit from 11/14/2020 in [facility]  PHQ-2 Total Score 3  PHQ-9 Total Score 14      Flowsheet Row [facility]  [facility] from 11/02/2020 in [facility]  C-SSRS RISK CATEGORY No Risk No Risk No Risk        Review of Systems:  Review of Systems  Musculoskeletal:  Negative for gait problem.  Neurological:  Negative for tremors.  Psychiatric/Behavioral:         Please refer to HPI   Medications: I have reviewed the  patient's current medications.  Current Outpatient Medications  Medication Sig Dispense Refill   ALPRAZolam (XANAX) 1 MG tablet Take 1 tablet (1 mg total) by mouth 2 (two) times daily as needed for anxiety. 60 tablet 2   amphetamine-dextroamphetamine (ADDERALL XR) 25 MG 24 hr capsule Take two capsules by mouth daily after breakfast. 60 capsule 0   amphetamine-dextroamphetamine (ADDERALL XR) 25 MG 24 hr capsule Take two capsules by mouth daily after breakfast. 60 capsule 0   amphetamine-dextroamphetamine (ADDERALL XR) 25 MG 24 hr capsule Take 2 capsules by mouth daily after breakfast. 60 capsule 0   amphetamine-dextroamphetamine (ADDERALL) 20 MG tablet Take 1 tablet (20 mg total) by mouth daily. 30 tablet 0   amphetamine-dextroamphetamine (ADDERALL) 20 MG tablet Take 1 tablet (20 mg total) by mouth daily. 30 tablet 0   amphetamine-dextroamphetamine (ADDERALL) 20 MG tablet Take 1 tablet (20 mg total) by mouth daily before supper. 30 tablet 0   desvenlafaxine (PRISTIQ) 100 MG 24 hr tablet Take 1 tablet (100 mg total) by mouth daily. 30 tablet 5   Multiple Vitamin (ONE-DAILY MULTI-VITAMIN) TABS Take 1 tablet by mouth daily.     No current facility-administered medications for this visit.    Medication Side Effects: None  Allergies:  Allergies  Allergen Reactions   Clarithromycin Other (See Comments) and Rash    Vasculitis Vasculitis  Other reaction(s): Unknown   Penicillins Other (See Comments)    Other reaction(s): Intolerance vasculitis vasculitis    Penicillin G     Other reaction(s): Unknown  Past Medical History:  Diagnosis Date   Acute non-recurrent maxillary sinusitis 07/12/2017   ADHD (attention deficit hyperactivity disorder)    Anxiety    Cough 07/12/2017   Diaphoresis    Dysmenorrhea    IUD (intrauterine device) in place    Obesity (BMI 30.0-34.9)    Obsessive compulsive disorder     Past Medical History, Surgical history, Social history, and Family history  were reviewed and updated as appropriate.   Please see review of systems for further details on the patient's review from today.   Objective:   Physical Exam:  There were no vitals taken for this visit.  Physical Exam Constitutional:      General: She is not in acute distress. Musculoskeletal:        General: No deformity.  Neurological:     Mental Status: She is alert and oriented to person, place, and time.     Coordination: Coordination normal.  Psychiatric:        Attention and Perception: Attention and perception normal. She does not perceive auditory or visual hallucinations.        Mood and Affect: Mood normal. Mood is not anxious or depressed. Affect is not labile, blunt, angry or inappropriate.        Speech: Speech normal.        Behavior: Behavior normal.        Thought Content: Thought content normal. Thought content is not paranoid or delusional. Thought content does not include homicidal or suicidal ideation. Thought content does not include homicidal or suicidal plan.        Cognition and Memory: Cognition and memory normal.        Judgment: Judgment normal.     Comments: Insight intact    Lab Review:     Component Value Date/Time   NA 140 05/14/2021 1049   K 4.6 05/14/2021 1049   CL 101 05/14/2021 1049   CO2 22 05/14/2021 1049   GLUCOSE 90 05/14/2021 1049   GLUCOSE 95 11/14/2020 1007   BUN 12 05/14/2021 1049   CREATININE 0.70 05/14/2021 1049   CALCIUM 10.0 05/14/2021 1049   PROT 7.7 05/14/2021 1049   ALBUMIN 5.1 (H) 05/14/2021 1049   AST 17 05/14/2021 1049   ALT 13 05/14/2021 1049   ALKPHOS 76 05/14/2021 1049   BILITOT 0.2 05/14/2021 1049   GFRNONAA >60 11/14/2020 1007       Component Value Date/Time   WBC 7.5 05/14/2021 1049   WBC 10.5 11/02/2020 0828   RBC 4.85 05/14/2021 1049   RBC 4.28 11/02/2020 0828   HGB 15.1 05/14/2021 1049   HCT 44.1 05/14/2021 1049   PLT 256 11/02/2020 0828   MCV 91 05/14/2021 1049   MCH 31.1 05/14/2021 1049   MCH  30.4 11/02/2020 0828   MCHC 34.2 05/14/2021 1049   MCHC 34.1 11/02/2020 0828   RDW 12.6 05/14/2021 1049   LYMPHSABS 2.3 05/14/2021 1049   MONOABS 0.6 11/02/2020 0828   EOSABS 0.0 05/14/2021 1049   BASOSABS 0.0 05/14/2021 1049    No results found for: POCLITH, LITHIUM   No results found for: PHENYTOIN, PHENOBARB, VALPROATE, CBMZ   .res Assessment: Plan:    Plan:  PDMP reviewed  Continue:  Adderall XR 25mg  capsule daily Adderall 20mg  daily - not using as much Xanax 1mg  BID Pristiq 100mg  every morning   117/80 - 102 - will monitor between visits  RTC 3 months  Patient advised to contact office with any questions, adverse effects, or  acute worsening in signs and symptoms.  Discussed potential benefits, risk, and side effects of benzodiazepines to include potential risk of tolerance and dependence, as well as possible drowsiness.  Advised patient not to drive if experiencing drowsiness and to take lowest possible effective dose to minimize risk of dependence and tolerance.  Discussed potential benefits, risks, and side effects of stimulants with patient to include increased heart rate, palpitations, insomnia, increased anxiety, increased irritability, or decreased appetite.  Instructed patient to contact office if experiencing any significant tolerability issues.  Diagnoses and all orders for this visit:  Generalized anxiety disorder  Attention deficit hyperactivity disorder (ADHD), combined type, moderate -     amphetamine-dextroamphetamine (ADDERALL XR) 25 MG 24 hr capsule; Take 2 capsules by mouth daily after breakfast. -     amphetamine-dextroamphetamine (ADDERALL) 20 MG tablet; Take 1 tablet (20 mg total) by mouth daily before supper.  Panic disorder  Mixed obsessional thoughts and acts     Please see After Visit Summary for patient specific instructions.  Future Appointments  Date Time Provider Department Center  10/29/2021  5:00 PM Mathis Fare, LCSW [facility]  None  11/12/2021  5:00 PM Mathis Fare, LCSW [facility] None  11/20/2021  5:00 PM Mathis Fare, LCSW [facility] None  12/04/2021  5:00 PM Mathis Fare, LCSW [facility] None  12/18/2021  5:00 PM Mathis Fare, LCSW [facility] None  01/01/2022  5:00 PM Mathis Fare, LCSW [facility] None  01/21/2022  5:00 PM Mathis Fare, LCSW [facility] None  05/16/2022  3:30 PM Early, Sung Amabile, NP [facility]    No orders of the defined types were placed in this encounter.   -------------------------------

## 2021-10-29 ENCOUNTER — Ambulatory Visit (INDEPENDENT_AMBULATORY_CARE_PROVIDER_SITE_OTHER): Payer: BC Managed Care – PPO | Admitting: Psychiatry

## 2021-10-29 DIAGNOSIS — F411 Generalized anxiety disorder: Secondary | ICD-10-CM

## 2021-10-29 NOTE — Progress Notes (Signed)
?    [facility] Counselor/Therapist Progress Note ? ?Patient ID: Mariah Young, MRN: [id number],   ? ?Date: 10/29/2021 ? ?Time Spent: 50 minutes  ? ?Treatment Type: Individual Therapy ? ?Reported Symptoms: anxiety, depression ,"anxiety is the stronger one" ? ?Mental Status Exam: ? ?Appearance:   Casual     ?Behavior:  Appropriate, Sharing, and Motivated  ?Motor:  Normal  ?Speech/Language:   Clear and Coherent  ?Affect:  Depressed and anxious  ?Mood:  anxious and depressed  ?Thought process:  goal directed  ?Thought content:    Overthinking, obsessive thoughts  ?Sensory/Perceptual disturbances:    WNL  ?Orientation:  oriented to person, place, time/date, situation, day of week, month of year, year, and stated date of October 29, 2021  ?Attention:  Fair/Good  ?Concentration:  Fair  ?Memory:  WNL  ?Fund of knowledge:   Good  ?Insight:    Good  ?Judgment:   Good  ?Impulse Control:  Good and Fair  ? ?Risk Assessment: ?Danger to Self:  No ?Self-injurious Behavior: No ?Danger to Others: No ?Duty to Warn:no ?Physical Aggression / Violence:No  ?Access to Firearms a concern: No  ?Gang Involvement:No  ? ?Subjective:  Patient in today reporting anxiety as stronger symptom, along with depression. No SI. Reports making progress in her job training and doing "pretty well" . To be in a wedding in a few weeks and looking forward to that. Some "tearfulness that is stress-related." Emotionally, feeling like I'm some better but also feeling very overwhelmed with job training, "feeling like I need to be perfect" in my training. Worked on her not adding to her stress with unrealistic expectations which she was able to process more today and to be more patient with herself in her process of work training, and also certain expectations within her relationship with BF and moving forward. Self-doubt "some better but I need patience and contentment in work training and in her relationship." Better with her self-care as discussed in last  session and again with more noticeable progress today.  Has followed through with some improvement in her self-care including trying to have better sleep patterns except when it is more difficult when she travels, improved positive self talk, good physical care of herself, feeling more self assured in her management of uncertainties and stressors, and believing in herself more that she can manage when things do not quite go as hoped for or planned. ? ?Interventions: Solution-Oriented/Positive Psychology and Insight-Oriented ? ?Treatment goal plan :  ?Patient not signing tx plan on computer screen due to Covid. ?Treatment Goals: ?Goals will remain on tx plan as patient works with strategies to achieve her goals. Progress will be noted each session and documented in "Progress" section of goal plan. ?Long term goal: (Measurable) ?1. Stabilize anxiety level while increasing ability to function on a daily basis.  ?2. Patient will eventually progress to where she rates her anxiety as a "3" or less on "1-10 Anxiety scale" for at least 2 months. ?Short term goal: ?Increase understanding of beliefs and messages that produce anxiety, worry,fear, and negativity.  ?Strategies: ?Identify, challenge, and replace anxious/fearful/negative with positive, hopeful, and empoweriing self-talk. ? ?Diagnosis: ?  ICD-10-CM   ?1. Generalized anxiety disorder  F41.1   ?  ? ?Plan:  Patient today showing good motivation and active participation in session as she worked on her anxiety, some depression, stress management, relationship issues, and the tendency to be self doubtful and leaning towards fearful thinking especially in things that are out of  her control which we processed several circumstances today that are definitely out of her control and she is feeling stressed by.  Overall, her depression has decreased and anxiety is decreasing some but not as noticeable as the depression.  Continues trying not to assume worst case scenarios and  to notice the positives more than the negatives and situations.  This continues to be a challenge for patient but she is showing effort and growth.  Worked with her to look at more positive, trusting ways of viewing herself versus the self doubtful ways she has held onto.  She has however been decreasing her self doubt gradually but needs to keep working on that consistently. Encouraged patient to remain in her practice of more positive behaviors including: Reflecting more on her progress and personal growth daily, believing in herself more and that she can make significant changes in positive directions, getting outside daily, walking some each day, spending time with her dog as this is very therapeutic for patient, staying in the present focusing on what she can control, trying to stop jumping ahead and assuming worst case scenarios, reaching out to people in her support network, good nutrition and exercise, interrupting and challenging/replacing her anxious/negative/depressive/fearful thoughts to be more reality based in her thinking, let go of negative thoughts and assumptions that hold her back, continue working on adjusting more easily when things do not go as planned or are out of her control, stop self negating, practice more consistent positive self talk, intentionally look for positives within herself and be able to name them, practice forgiveness of others and of herself, and realize the strength she shows working with goal-directed behaviors to move in a direction that supports her improved emotional health and overall wellbeing. ? ?Goal review and progress/challenges noted with patient. ? ?Next appointment within 3 weeks. ? ?This record has been created using [employer].  Chart creation errors have been sought, but may not always have been located and corrected.  Such creation errors do not reflect on the standard of medical care provided. ? ? ?Shanon Ace,  LCSW ? ? ? ? ? ? ? ? ? ? ? ? ? ? ? ? ? ? ?

## 2021-11-12 ENCOUNTER — Ambulatory Visit: Payer: BC Managed Care – PPO | Admitting: Psychiatry

## 2021-11-20 ENCOUNTER — Ambulatory Visit (INDEPENDENT_AMBULATORY_CARE_PROVIDER_SITE_OTHER): Payer: BC Managed Care – PPO | Admitting: Psychiatry

## 2021-11-20 DIAGNOSIS — F411 Generalized anxiety disorder: Secondary | ICD-10-CM | POA: Diagnosis not present

## 2021-11-20 NOTE — Progress Notes (Signed)
?    [facility] Counselor/Therapist Progress Note ? ?Patient ID: Mariah Young, MRN: [id number],   ? ?Date: 11/20/2021 ? ?Time Spent:  55 minutes ? ?Treatment Type: Individual Therapy ? ?Reported Symptoms: anxiety, depression ("some") ? ?Mental Status Exam: ? ?Appearance:   Casual     ?Behavior:  Appropriate, Sharing, and Motivated  ?Motor:  Normal  ?Speech/Language:   Clear and Coherent  ?Affect:  anxious  ?Mood:  anxious  ?Thought process:  goal directed  ?Thought content:    overthinking  ?Sensory/Perceptual disturbances:    WNL  ?Orientation:  oriented to person, place, time/date, situation, day of week, month of year, year, and stated date of Nov 20, 2021  ?Attention:  Good  ?Concentration:  Good  ?Memory:  WNL  ?Fund of knowledge:   Good  ?Insight:    Good and Fair  ?Judgment:   Good  ?Impulse Control:  Good  ? ?Risk Assessment: ?Danger to Self:  No ?Self-injurious Behavior: No ?Danger to Others: No ?Duty to Warn:no ?Physical Aggression / Violence:No  ?Access to Firearms a concern: No  ?Gang Involvement:No  ? ?Subjective:  Patient in today reporting anxiety, some depression, and stressed mostly regarding personal and work situations. Has been in job training that she feels has gone pretty well, but some layoffs happening and "that makes me nervous. Overwhelmed, worries, tearful, tending to look for what may go wrong versus right. Shared a lot of her anxious/fearful/depressive thoughts/feelings which helped patient in sorting through a lot of her thoughts and being able to feel more calm and grounded "rather than so on edge."Getting closer to BF and talking possibly getting married eventually. Feeling the need "to be perfect" on her job which is adding to her stress. Struggles with some self-doubt but seems to be improving. Practicing more patience with herself an others. Self-progress continues.Sleep is some better. Self-talk improving. Progress in managing uncertainties and stressors. Overall not feeling  better about herself. ? ?Interventions: Solution-Oriented/Positive Psychology and Insight-Oriented ? ?Treatment goal plan :  ?Patient not signing tx plan on computer screen due to Covid. ?Treatment Goals: ?Goals will remain on tx plan as patient works with strategies to achieve her goals. Progress will be noted each session and documented in "Progress" section of goal plan. ?Long term goal: (Measurable) ?1. Stabilize anxiety level while increasing ability to function on a daily basis.  ?2. Patient will eventually progress to where she rates her anxiety as a "3" or less on "1-10 Anxiety scale" for at least 2 months. ?Short term goal: ?Increase understanding of beliefs and messages that produce anxiety, worry,fear, and negativity.  ?Strategies: ?Identify, challenge, and replace anxious/fearful/negative with positive, hopeful, and empoweriing self-talk. ? ?Diagnosis: ?  ICD-10-CM   ?1. Generalized anxiety disorder  F41.1   ?  ? ?Plan: Patient today showing good motivation and actively participated in session as she focused on personal, relationship, and work stressors and adjustments and some of her own growth personally.  Encouraged to continue the progress that she is sensing also, using goal-directed behaviors, improved self talk and self-care, trusting more herself and others, and being able to work through times of heightened anxiety and stress while building more confidence in herself to work through those feelings and move forward. Encouraged patient to continue working on practicing more positive behaviors including: Reflecting more on her progress and personal growth often, believing in herself more that she can make significant changes and positive directions, getting outside daily and walking, spending time with her dog as this  is very therapeutic for her, staying in the present focusing on what she can control, trying to stop jumping ahead and assuming worst case scenarios, reaching out to people in her  support network, good nutrition and exercise, interrupting and challenging/replacing her anxious/negative/depressive/fearful thoughts to be more reality based in her thinking, let go of negative thoughts and assumptions that hold her back, continue working on adjusting more easily when things do not go as planned or are out of her control, stop self negating, practice more consistent positive self talk, intentionally look for positives within herself and be able to name them, practice forgiveness of others and of herself, and recognize the strength she shows working with goal-directed behaviors to move in a direction that supports her improved emotional health. ? ?Goal review and progress/challenges noted with patient. ? ?Next appointment within 3 weeks. ? ?This record has been created using [employer].  Chart creation errors have been sought, but may not always have been located and corrected.  Such creation errors do not reflect on the standard of medical care provided. ? ? ?Mathis Fare, LCSW ? ? ? ? ? ? ? ? ? ? ? ? ? ? ? ? ? ? ?

## 2021-12-04 ENCOUNTER — Ambulatory Visit (INDEPENDENT_AMBULATORY_CARE_PROVIDER_SITE_OTHER): Payer: BC Managed Care – PPO | Admitting: Psychiatry

## 2021-12-04 DIAGNOSIS — F411 Generalized anxiety disorder: Secondary | ICD-10-CM | POA: Diagnosis not present

## 2021-12-04 NOTE — Progress Notes (Signed)
?    [facility] Counselor/Therapist Progress Note ? ?Patient ID: Mariah Young, MRN: [id number],   ? ?Date: 12/04/2021 ? ?Time Spent:  50 minutes  ? ?Treatment Type: Individual Therapy ? ?Reported Symptoms: anxiety, some depression ? ?Mental Status Exam: ? ?Appearance:   Casual     ?Behavior:  Appropriate, Sharing, and Motivated  ?Motor:  Normal  ?Speech/Language:   Clear and Coherent  ?Affect:  Depressed and anxiety  ?Mood:  anxious and depressed  ?Thought process:  goal directed  ?Thought content:    WNL  ?Sensory/Perceptual disturbances:    overthinking  ?Orientation:  oriented to person, place, time/date, situation, day of week, month of year, year, and stated date of Dec 04, 2021  ?Attention:  Good  ?Concentration:  Good and Fair  ?Memory:  WNL  ?Fund of knowledge:   Good  ?Insight:    Good and Fair  ?Judgment:   Good  ?Impulse Control:  Good  ? ?Risk Assessment: ?Danger to Self:  No ?Self-injurious Behavior: No ?Danger to Others: No ?Duty to Warn:no ?Physical Aggression / Violence:No  ?Access to Firearms a concern: No  ?Gang Involvement:No  ? ?Subjective: Patient in today reporting anxiety, depression, overthinking, all mostly related to personal, relationship, and work situations.  Stated "I am really struggling with patience!" Described and gave examples of how her lack of patience is playing out in several areas of her life including her relationship, personally, and at work. Processing some issues occurring within her work environment  and in her relationship with long term BF. Tearfully shared her concerns with some recent issues between her and BF and patient having to deal with multiple uncertainties. Coworker's husband died suddenly yesterday and patient very saddened by this which she shared today.  Trying not to rush through decisions and yet feeling the impatience also, and is working towards having more patience in order to make better decisions especially in the midst of several uncertainties  for her and BF.  Sleep is still okay, self-doubt not quite as strong, although needing to work further on the "need to be perfect".  ? ?Interventions: Solution-Oriented/Positive Psychology, Ego-Supportive, and Insight-Oriented ? ?Treatment goal plan :  ?Patient not signing tx plan on computer screen due to Covid. ?Treatment Goals: ?Goals will remain on tx plan as patient works with strategies to achieve her goals. Progress will be noted each session and documented in "Progress" section of goal plan. ?Long term goal: (Measurable) ?1. Stabilize anxiety level while increasing ability to function on a daily basis.  ?2. Patient will eventually progress to where she rates her anxiety as a "3" or less on "1-10 Anxiety scale" for at least 2 months. ?Short term goal: ?Increase understanding of beliefs and messages that produce anxiety, worry,fear, and negativity.  ?Strategies: ?Identify, challenge, and replace anxious/fearful/negative with positive, hopeful, and empoweriing self-talk. ?  ?Diagnosis: ?  ICD-10-CM   ?1. Generalized anxiety disorder  F41.1   ?  ? ?Plan:   Patient today participated well in session and showed good motivation as she worked further on relationship issues, work stressors, and her own personal growth issues, and "dealing more with uncertainties and not over thinking everything".  Overthinking has increased more recently as a result of some conversations with her and BF in reference to their future.  Patient working hard not to "assume worst case scenarios" and also not rushing to make big decisions in spite of her impatience.  Worked some today on being more tolerant in the face of current  uncertainties, and trying to be more understanding of herself and also her BF as they negotiate some decisions in their relationship.  Encouraged patient to feel good about the hard work that she is doing and in trying to be more patience with herself, with others, and with things that are out of her control.  Encouraged patient in practicing more positive behaviors between sessions including: Believing in herself more that she can make significant changes in positive directions, reflect on her progress often, get outside daily and walk, spending time with her dog as this is very therapeutic for her, stay in the present focusing on what she can control or change, trying to stop jumping ahead and assuming worst case scenarios, reach out to people in her support network, good nutrition and exercise, interrupt and challenge/replace her anxious/negative/depressive/fearful thoughts to be more reality based in her thinking, let go of negative thoughts and assumptions that hold her back, continue working on adjusting more easily when things do not go as planned or are out of her control, stop self negating, practice more consistent positive self talk, intentionally look for positives within herself and be able to name them, practice forgiveness of others and of herself, and realize the strength she shows working with goal directed behaviors to move in a direction that supports her improved emotional health and overall outlook. ? ?Goal review and progress/challenges noted with patient. ? ?Next appointment within 2 to 3 weeks. ? ?This record has been created using [employer].  Chart creation errors have been sought, but may not always have been located and corrected.  Such creation errors do not reflect on the standard of medical care provided. ? ? ?Mathis Fare, LCSW ? ? ? ? ? ? ? ? ? ? ? ? ? ? ? ? ? ? ?

## 2021-12-07 ENCOUNTER — Other Ambulatory Visit: Payer: Self-pay | Admitting: Adult Health

## 2021-12-07 ENCOUNTER — Telehealth: Payer: Self-pay | Admitting: Adult Health

## 2021-12-07 DIAGNOSIS — F902 Attention-deficit hyperactivity disorder, combined type: Secondary | ICD-10-CM

## 2021-12-07 MED ORDER — AMPHETAMINE-DEXTROAMPHET ER 25 MG PO CP24: ORAL_CAPSULE | ORAL | 0 refills | Status: DC

## 2021-12-07 NOTE — Telephone Encounter (Signed)
Patient called in for refill on Adderall XR 25mg . States she is completely out and needs refill sent to [facility] [address] [place] Ph: [place] [phone] Appt 6/12

## 2021-12-07 NOTE — Telephone Encounter (Signed)
Script sent  

## 2021-12-18 ENCOUNTER — Ambulatory Visit (INDEPENDENT_AMBULATORY_CARE_PROVIDER_SITE_OTHER): Payer: BC Managed Care – PPO | Admitting: Psychiatry

## 2021-12-18 DIAGNOSIS — F411 Generalized anxiety disorder: Secondary | ICD-10-CM | POA: Diagnosis not present

## 2021-12-18 NOTE — Progress Notes (Signed)
[facility] Counselor/Therapist Progress Note  Patient ID: Mariah Young, MRN: [id number],    Date: 12/18/2021  Time Spent: 55 minutes   Treatment Type: Individual Therapy  Reported Symptoms: anxiety, depression  Mental Status Exam:  Appearance:   Casual     Behavior:  Appropriate, Sharing, and Motivated  Motor:  Normal  Speech/Language:   Clear and Coherent  Affect:  Depressed and anxious  Mood:  anxious and depressed  Thought process:  goal directed  Thought content:    Obsessions  Sensory/Perceptual disturbances:    WNL  Orientation:  oriented to person, place, time/date, situation, day of week, month of year, year, and stated date of Dec 18, 2021  Attention:  Good  Concentration:  Good and Fair  Memory:  WNL  Fund of knowledge:   Good  Insight:    Good and Fair  Judgment:   Good  Impulse Control:  Good and Fair   Risk Assessment: Danger to Self:  No Self-injurious Behavior: No Danger to Others: No Duty to Warn:no Physical Aggression / Violence:No  Access to Firearms a concern: No  Gang Involvement:No   Subjective:  Patient in today reporting anxiety and depression, mostly related to personal, relationship, and work concerns.Very torn and stressed in her work and relationship due to some recent occurrences re: job that also impacts relationship. Tearfully processed these in session today, recognizing how it has escalated her anxiety. Some self-doubt, overthinking, imagining worse case scenarios, depression, and brief tearfulness. Impatience increased some expecially in personal, work, and relationship issues. Working on some disappointment issues, managing her anxiety and impatience more effectively, along with uncertainties in areas that are important for her. Really focusing on trying to make good decisions in midst of a lot of stress and seems to be weighing certain options well and understanding some things don't have to be decided right now. Still struggles  with perfectionism which we continue to work on in sessions. Agrees to focus more on allowing for improved sleep.  Interventions: Solution-Oriented/Positive Psychology, Ego-Supportive, and Insight-Oriented   Treatment goal plan :  Patient not signing tx plan on computer screen due to Covid. Treatment Goals: Goals will remain on tx plan as patient works with strategies to achieve her goals. Progress will be noted each session and documented in "Progress" section of goal plan. Long term goal: (Measurable) 1. Stabilize anxiety level while increasing ability to function on a daily basis.  2. Patient will eventually progress to where she rates her anxiety as a "3" or less on "1-10 Anxiety scale" for at least 2 months. Short term goal: Increase understanding of beliefs and messages that produce anxiety, worry,fear, and negativity.  Strategies: Identify, challenge, and replace anxious/fearful/negative with positive, hopeful, and empoweriing self-talk.  Diagnosis:   ICD-10-CM   1. Generalized anxiety disorder  F41.1      Plan:  Patient showing good motivation and participation in session today as she focused on her anxiety and depression that are very present in her personal, work, and relationship concerns.  Doing better and working through relationship concerns.  Continued work on her overthinking, imagining worst case scenarios, self doubt, and not rushing into big decisions.  Trying to manage her anxiety and impatience better as she is in the midst of making significant decisions regarding important areas of her life.  (Not all details included in this note due to patient privacy needs.)  Does seem to be getting stronger within herself and gaining some confidence that she has  needed although it has been in the midst of a lot of stress and uncertainties.  Did well today and confronting issues and realizing the importance of "timing" on some of her decision making as she is showing maturity in that  respect.  Trying not to over think as much and she reports getting better at catching herself and stopping that cycle.  Feeling some overwhelmingness at times with uncertainties and things that are out of her control, but also feels the support of her BF and her parents which are helpful. Encouraged patient to be practicing the positive behaviors discussed in sessions including: Believing in herself and her ability to make significant changes in positive directions, reflect on her progress often, get outside daily and walk, spending time with her dog which is very therapeutic for her, stay in the present focusing on what she can control or change, trying to stop jumping ahead and assuming worst case scenarios, reach out to people in her support network, good nutrition and exercise, interrupt and challenge/replace her anxious/negative/depressive/fearful thoughts to be more reality based in her thinking, let go of negative thoughts and assumptions that hold her back, continue working on adjusting more easily when things do not go as planned or are out of her control, stop self negating, practice more consistent positive self talk, intentionally look for positives within herself and be able to name them, practice forgiveness of others and of herself, and recognize the strength she shows working with goal directed behaviors to move in a direction that supports her improved emotional health and overall outlook.  Review and progress/challenges noted with patient.  Next appointment within 2 to 3 weeks.  This record has been created using [employer].  Chart creation errors have been sought, but may not always have been located and corrected.  Such creation errors do not reflect on the standard of medical care provided.   Mathis Fare, LCSW

## 2021-12-31 ENCOUNTER — Encounter: Payer: Self-pay | Admitting: Adult Health

## 2021-12-31 ENCOUNTER — Ambulatory Visit (INDEPENDENT_AMBULATORY_CARE_PROVIDER_SITE_OTHER): Payer: BC Managed Care – PPO | Admitting: Adult Health

## 2021-12-31 ENCOUNTER — Telehealth: Payer: Self-pay | Admitting: Adult Health

## 2021-12-31 DIAGNOSIS — F411 Generalized anxiety disorder: Secondary | ICD-10-CM

## 2021-12-31 DIAGNOSIS — F422 Mixed obsessional thoughts and acts: Secondary | ICD-10-CM | POA: Diagnosis not present

## 2021-12-31 DIAGNOSIS — F410 Panic disorder [episodic paroxysmal anxiety] without agoraphobia: Secondary | ICD-10-CM

## 2021-12-31 DIAGNOSIS — F902 Attention-deficit hyperactivity disorder, combined type: Secondary | ICD-10-CM | POA: Diagnosis not present

## 2021-12-31 MED ORDER — AMPHETAMINE-DEXTROAMPHETAMINE 20 MG PO TABS: 20.0000 mg | ORAL_TABLET | Freq: Every day | ORAL | 0 refills | Status: DC

## 2021-12-31 MED ORDER — AMPHETAMINE-DEXTROAMPHET ER 25 MG PO CP24: 50.0000 mg | ORAL_CAPSULE | Freq: Every day | ORAL | 0 refills | Status: DC

## 2021-12-31 MED ORDER — DESVENLAFAXINE SUCCINATE ER 100 MG PO TB24: 100.0000 mg | ORAL_TABLET | Freq: Every day | ORAL | 5 refills | Status: DC

## 2021-12-31 MED ORDER — ALPRAZOLAM 1 MG PO TABS: 1.0000 mg | ORAL_TABLET | Freq: Two times a day (BID) | ORAL | 2 refills | Status: DC | PRN

## 2021-12-31 MED ORDER — AMPHETAMINE-DEXTROAMPHET ER 25 MG PO CP24: ORAL_CAPSULE | ORAL | 0 refills | Status: DC

## 2021-12-31 NOTE — Progress Notes (Signed)
Mariah Young [id number] 24-Dec-1995 26 y.o.  Subjective:   Patient ID:  Mariah Young is a 26 y.o. (DOB [birth year]) female.  Chief Complaint: No chief complaint on file.   HPI Dalaney Needle presents to the office today for follow-up of ADHD, panic disorder, GAD, and obsessional thoughts.   Describes mood today as "ok". Flat. Decreased tearfulness. Mood symptoms - reports decreased depression and anxiety - "a little bit". Denies irritability. Denies panic attacks. Stating "things are going ok". Feels like medications are working well. She and partner doing well. Improved interest and motivation. Working with therapist - Rockne Menghini. Taking medications as prescribed. Energy levels stable. Active, does not have a regular exercise routine.  Enjoys some usual interests and activities. In a relationship. Lives alone with dog - "[name]". Family in [place]. Appetite adequate. Weight stable. Sleep is improving. Averages 6 to 8 hours. Focus and concentration stable. Completing tasks. Managing aspects of household. Working full time.  Denies SI or HI.  Denies AH or VH.  Previous medication trials: Prozac    GAD-7    Flowsheet Row Office Visit from 11/14/2020 in [facility]  Total GAD-7 Score 11      PHQ2-9    Flowsheet Row Office Visit from 11/14/2020 in [facility]  PHQ-2 Total Score 3  PHQ-9 Total Score 14      Flowsheet Row ED from 03/08/2021 in [facility]  ED from 02/22/2021 in [facility] ED from 11/02/2020 in [facility]  C-SSRS RISK CATEGORY No Risk No Risk No Risk        Review of Systems:  Review of Systems  Musculoskeletal:  Negative for gait problem.  Neurological:  Negative for tremors.  Psychiatric/Behavioral:         Please refer to HPI    Medications: I have reviewed the  patient's current medications.  Current Outpatient Medications  Medication Sig Dispense Refill   ALPRAZolam (XANAX) 1 MG tablet Take 1 tablet (1 mg total) by mouth 2 (two) times daily as needed for anxiety. 60 tablet 2   amphetamine-dextroamphetamine (ADDERALL XR) 25 MG 24 hr capsule Take 2 capsules by mouth daily after breakfast. 60 capsule 0   [START ON 01/28/2022] amphetamine-dextroamphetamine (ADDERALL XR) 25 MG 24 hr capsule Take two capsules by mouth daily after breakfast. 60 capsule 0   [START ON 02/25/2022] amphetamine-dextroamphetamine (ADDERALL XR) 25 MG 24 hr capsule Take one capsule twice daily. 60 capsule 0   amphetamine-dextroamphetamine (ADDERALL) 20 MG tablet Take 1 tablet (20 mg total) by mouth daily. 30 tablet 0   [START ON 01/28/2022] amphetamine-dextroamphetamine (ADDERALL) 20 MG tablet Take 1 tablet (20 mg total) by mouth daily. 30 tablet 0   [START ON 02/25/2022] amphetamine-dextroamphetamine (ADDERALL) 20 MG tablet Take 1 tablet (20 mg total) by mouth daily before supper. 30 tablet 0   desvenlafaxine (PRISTIQ) 100 MG 24 hr tablet Take 1 tablet (100 mg total) by mouth daily. 30 tablet 5   Multiple Vitamin (ONE-DAILY MULTI-VITAMIN) TABS Take 1 tablet by mouth daily.     No current facility-administered medications for this visit.    Medication Side Effects: None  Allergies:  Allergies  Allergen Reactions   Clarithromycin Other (See Comments) and Rash    Vasculitis Vasculitis  Other reaction(s): Unknown   Penicillins Other (See Comments)    Other reaction(s): Intolerance vasculitis vasculitis    Penicillin G  Other reaction(s): Unknown    Past Medical History:  Diagnosis Date   Acute non-recurrent maxillary sinusitis 07/12/2017   ADHD (attention deficit hyperactivity disorder)    Anxiety    Cough 07/12/2017   Diaphoresis    Dysmenorrhea    IUD (intrauterine device) in place    Obesity (BMI 30.0-34.9)    Obsessive compulsive disorder     Past Medical  History, Surgical history, Social history, and Family history were reviewed and updated as appropriate.   Please see review of systems for further details on the patient's review from today.   Objective:   Physical Exam:  There were no vitals taken for this visit.  Physical Exam Constitutional:      General: She is not in acute distress. Musculoskeletal:        General: No deformity.  Neurological:     Mental Status: She is alert and oriented to person, place, and time.     Coordination: Coordination normal.  Psychiatric:        Attention and Perception: Attention and perception normal. She does not perceive auditory or visual hallucinations.        Mood and Affect: Mood normal. Mood is not anxious or depressed. Affect is not labile, blunt, angry or inappropriate.        Speech: Speech normal.        Behavior: Behavior normal.        Thought Content: Thought content normal. Thought content is not paranoid or delusional. Thought content does not include homicidal or suicidal ideation. Thought content does not include homicidal or suicidal plan.        Cognition and Memory: Cognition and memory normal.        Judgment: Judgment normal.     Comments: Insight intact     Lab Review:     Component Value Date/Time   NA 140 05/14/2021 1049   K 4.6 05/14/2021 1049   CL 101 05/14/2021 1049   CO2 22 05/14/2021 1049   GLUCOSE 90 05/14/2021 1049   GLUCOSE 95 11/14/2020 1007   BUN 12 05/14/2021 1049   CREATININE 0.70 05/14/2021 1049   CALCIUM 10.0 05/14/2021 1049   PROT 7.7 05/14/2021 1049   ALBUMIN 5.1 (H) 05/14/2021 1049   AST 17 05/14/2021 1049   ALT 13 05/14/2021 1049   ALKPHOS 76 05/14/2021 1049   BILITOT 0.2 05/14/2021 1049   GFRNONAA >60 11/14/2020 1007       Component Value Date/Time   WBC 7.5 05/14/2021 1049   WBC 10.5 11/02/2020 0828   RBC 4.85 05/14/2021 1049   RBC 4.28 11/02/2020 0828   HGB 15.1 05/14/2021 1049   HCT 44.1 05/14/2021 1049   PLT 256 11/02/2020  0828   MCV 91 05/14/2021 1049   MCH 31.1 05/14/2021 1049   MCH 30.4 11/02/2020 0828   MCHC 34.2 05/14/2021 1049   MCHC 34.1 11/02/2020 0828   RDW 12.6 05/14/2021 1049   LYMPHSABS 2.3 05/14/2021 1049   MONOABS 0.6 11/02/2020 0828   EOSABS 0.0 05/14/2021 1049   BASOSABS 0.0 05/14/2021 1049    No results found for: "POCLITH", "LITHIUM"   No results found for: "PHENYTOIN", "PHENOBARB", "VALPROATE", "CBMZ"   .res Assessment: Plan:    Plan:  PDMP reviewed  Continue:  Adderall XR 25mg  capsule daily Adderall 20mg  daily - not using as much Xanax 1mg  BID Pristiq 100mg  every morning   118/88/92 - will monitor between visits  RTC 3 months  Patient advised to contact office with  any questions, adverse effects, or acute worsening in signs and symptoms.  Discussed potential benefits, risk, and side effects of benzodiazepines to include potential risk of tolerance and dependence, as well as possible drowsiness.  Advised patient not to drive if experiencing drowsiness and to take lowest possible effective dose to minimize risk of dependence and tolerance.  Discussed potential benefits, risks, and side effects of stimulants with patient to include increased heart rate, palpitations, insomnia, increased anxiety, increased irritability, or decreased appetite.  Instructed patient to contact office if experiencing any significant tolerability issues.   Diagnoses and all orders for this visit:  Generalized anxiety disorder -     ALPRAZolam (XANAX) 1 MG tablet; Take 1 tablet (1 mg total) by mouth 2 (two) times daily as needed for anxiety. -     desvenlafaxine (PRISTIQ) 100 MG 24 hr tablet; Take 1 tablet (100 mg total) by mouth daily.  Panic disorder -     ALPRAZolam (XANAX) 1 MG tablet; Take 1 tablet (1 mg total) by mouth 2 (two) times daily as needed for anxiety.  Mixed obsessional thoughts and acts -     ALPRAZolam (XANAX) 1 MG tablet; Take 1 tablet (1 mg total) by mouth 2 (two) times  daily as needed for anxiety. -     desvenlafaxine (PRISTIQ) 100 MG 24 hr tablet; Take 1 tablet (100 mg total) by mouth daily.  Attention deficit hyperactivity disorder (ADHD), combined type, moderate -     amphetamine-dextroamphetamine (ADDERALL) 20 MG tablet; Take 1 tablet (20 mg total) by mouth daily. -     amphetamine-dextroamphetamine (ADDERALL) 20 MG tablet; Take 1 tablet (20 mg total) by mouth daily. -     amphetamine-dextroamphetamine (ADDERALL) 20 MG tablet; Take 1 tablet (20 mg total) by mouth daily before supper. -     amphetamine-dextroamphetamine (ADDERALL XR) 25 MG 24 hr capsule; Take 2 capsules by mouth daily after breakfast. -     amphetamine-dextroamphetamine (ADDERALL XR) 25 MG 24 hr capsule; Take two capsules by mouth daily after breakfast. -     amphetamine-dextroamphetamine (ADDERALL XR) 25 MG 24 hr capsule; Take one capsule twice daily.     Please see After Visit Summary for patient specific instructions.  Future Appointments  Date Time Provider Department Center  01/01/2022  5:00 PM Mathis FareDowd, Deborah, LCSW [facility] None  01/21/2022  5:00 PM Mathis Fareowd, Deborah, LCSW [facility] None  02/11/2022  5:00 PM Mathis Fareowd, Deborah, LCSW [facility] None  03/06/2022  5:00 PM Mathis Fareowd, Deborah, LCSW [facility] None  03/27/2022  5:00 PM Mathis Fareowd, Deborah, LCSW [facility] None  04/24/2022  5:00 PM Mathis Fareowd, Deborah, LCSW [facility] None  05/16/2022  3:30 PM Early, Sung AmabileSara E, NP [facility]    No orders of the defined types were placed in this encounter.   -------------------------------

## 2021-12-31 NOTE — Telephone Encounter (Signed)
Patient spoke with pharmacy regarding prescriptions sent in today. Mariah Young was informed she needed a Pa for her Detroxmphetamine 25mg . She also said that insurance would only cover a 90 day subscription. Please call to discuss [phone]

## 2022-01-01 ENCOUNTER — Ambulatory Visit (INDEPENDENT_AMBULATORY_CARE_PROVIDER_SITE_OTHER): Payer: BC Managed Care – PPO | Admitting: Psychiatry

## 2022-01-01 ENCOUNTER — Telehealth: Payer: Self-pay

## 2022-01-01 DIAGNOSIS — F411 Generalized anxiety disorder: Secondary | ICD-10-CM | POA: Diagnosis not present

## 2022-01-01 MED ORDER — AMPHETAMINE-DEXTROAMPHET ER 25 MG PO CP24: 50.0000 mg | ORAL_CAPSULE | Freq: Every day | ORAL | 0 refills | Status: DC

## 2022-01-01 NOTE — Telephone Encounter (Signed)
Ok - I have an appt with her at 37.

## 2022-01-01 NOTE — Telephone Encounter (Signed)
Yes rx was sent as #30 but her insurance will only allow a 90 day supply now so they cancelled what was sent and are waiting for a 90 day supply to be sent

## 2022-01-01 NOTE — Progress Notes (Signed)
[facility] Counselor/Therapist Progress Note  Patient ID: Mariah Young, MRN: [id number],    Date: 01/01/2022  Time Spent: 55 minutes   Treatment Type: Individual Therapy  Reported Symptoms: anxiety, depression  Mental Status Exam:  Appearance:   Casual     Behavior:  Appropriate, Sharing, and Motivated  Motor:  Normal  Speech/Language:   Clear and Coherent  Affect:  Anxious  Mood:  anxious  Thought process:  normal  Thought content:    WNL  Sensory/Perceptual disturbances:    WNL  Orientation:  oriented to person, place, time/date, situation, day of week, month of year, year, and stated date of January 01, 2022  Attention:  Fair  Concentration:  Good and Fair  Memory:  [place] of knowledge:   Good  Insight:    Good and Fair  Judgment:   Good  Impulse Control:  Good   Risk Assessment: Danger to Self:  No Self-injurious Behavior: No Danger to Others: No Duty to Warn:no Physical Aggression / Violence:No  Access to Firearms a concern: No  Gang Involvement:No   Subjective:   Patient in today reporting anxiety, depression, decreased,  and some frustration related to personal, work, and relationship concerns. Feeling more independent as she's had to make some larger personal purchases recently, but feeling proud of herself. Feeling more positive in relationship with less stress more recently. Some decrease in self-doubt. No tearfulness, decreased impatience personally and at work. Overthinking. Taking her decision-making more seriously and wanting  Shares that she is often getting very stressed "as I feel like I'm letting people down,"but not worrying about being perfect at work anymore as so much is out of her control." States her perfectionistic tendencies have decreased "a little and I'm still working on it."   Interventions: Solution-Oriented/Positive Psychology and Insight-Oriented  Long term goal:  1. Stabilize anxiety level while increasing ability to function  on a daily basis.  2. Patient will eventually progress to where she rates her anxiety as a "3" or less on "1-10 Anxiety scale" for at least 2 months. Short term goal: Increase understanding of beliefs and messages that produce anxiety, worry,fear, and negativity.  Strategies: Identify, challenge, and replace anxious/fearful/negative with positive, hopeful, and empoweriing self-talk.  Diagnosis:   ICD-10-CM   1. Generalized anxiety disorder  F41.1      Plan:    Patient today showing good participation and motivation in session as she worked on her anxiety, depression, and some decision making within her personal life, work, and relationship.  Trying to budget her time better in ways that helps her anxiety to be lower. Also noticing when I do things well and not just see the negatives. Eating regularly and trying to get good sleep and feels she's "doing pretty good with that." Trying to plan ahead more and want to get more exercise. Some increased self-confidence. Showing more personal strength in some ways.  Review of goals and also highlighting some of her gains and how to maintain those gains going forward.  Feeling stronger within herself and less avoidant of any issues. Remains goal-focused and motivated.  Continues to recognize that there are a lot of things regarding personal and work issues that carry significant uncertainties right now but he is remaining motivated and hopeful, trying to focus on her strengths versus obstacles. Encouraged patient in her practice of more positive behaviors including: Believing in herself and her ability to make changes in positive directions, reflecting on her progress often, getting  outside daily and walking, spending quality time with her dog which is very therapeutic for her, remain in the present focusing on what she can control or change, stop jumping ahead and assuming worst case scenarios, reaching out to people in her support network, healthy nutrition and  exercise, interrupt/challenge her anxious/negative/depressive/fearful thoughts to be more reality-based and empowering, let go of negative thoughts and assumptions that hold her back, continue working and adjusting more easily when things do not go as planned or are out of her control, stop self negating, practice more consistent positive self talk, intentionally look for positives within herself and be able to name them, also look for positives within others, practice forgiveness of others and of herself, and realize the strength she shows working with goal directed behaviors to move in a direction that supports her improved emotional health and overall outlook.  Goal review and progress/challenges noted with patient.  Next appointment within 3 weeks.  This record has been created using [employer].  Chart creation errors have been sought, but may not always have been located and corrected.  Such creation errors do not reflect on the standard of medical care provided.   Shanon Ace, LCSW

## 2022-01-01 NOTE — Telephone Encounter (Signed)
Ok I did not know that. Ok to pend.

## 2022-01-01 NOTE — Telephone Encounter (Signed)
Script sent  

## 2022-01-01 NOTE — Telephone Encounter (Signed)
It's pended in this encounter

## 2022-01-01 NOTE — Telephone Encounter (Signed)
You saw pt yesterday,she is at the pharmacy now waiting for this to be approved before they run out

## 2022-01-01 NOTE — Telephone Encounter (Signed)
All scripts sent yesterday at appt.

## 2022-01-03 NOTE — Telephone Encounter (Signed)
PA approved Amphetamine-Dextroamphetamine 20 mg [employer] Effective:  01/03/22 to 01/01/25

## 2022-01-21 ENCOUNTER — Ambulatory Visit: Payer: BC Managed Care – PPO | Admitting: Psychiatry

## 2022-01-21 ENCOUNTER — Ambulatory Visit (INDEPENDENT_AMBULATORY_CARE_PROVIDER_SITE_OTHER): Payer: BC Managed Care – PPO | Admitting: Psychiatry

## 2022-01-21 DIAGNOSIS — F411 Generalized anxiety disorder: Secondary | ICD-10-CM | POA: Diagnosis not present

## 2022-01-21 NOTE — Progress Notes (Signed)
[facility] Counselor/Therapist Progress Note  Patient ID: Mariah Young, MRN: [id number],    Date: 01/21/2022  Time Spent: 55 minutes   Treatment Type: Individual Therapy  Reported Symptoms: anxiety, sadness/fears/uncertainties about her dog  Mental Status Exam:  Appearance:   Neat     Behavior:  Appropriate, Sharing, and Motivated  Motor:  Normal  Speech/Language:   Clear and Coherent  Affect:  Tearful and anxious, fearful  Mood:  anxious and sad  Thought process:  goal directed  Thought content:    Obsessions  Sensory/Perceptual disturbances:    WNL  Orientation:  oriented to person, place, time/date, situation, day of week, month of year, year, and stated date of January 21, 2022  Attention:  Good  Concentration:  Good  Memory:  WNL  Fund of knowledge:   Good  Insight:    Good and Fair  Judgment:   Good  Impulse Control:  Good   Risk Assessment: Danger to Self:  No Self-injurious Behavior: No Danger to Others: No Duty to Warn:no Physical Aggression / Violence:No  Access to Firearms a concern: No  Gang Involvement:No   Subjective:  Patient in today tearfully reporting anxiety, fearful thoughts, sadness, and "assuming the worst" in situations re: work and her dog. Discovered her dog has some type of "place inside her mouth bleeding some" and is taking her to vet this afternoon. Very anxious, worried and hard not to assume the worst, which she talked through more during session today which did seem to be helpful in patient being able to become more grounded during session.  Patient is also experiencing a lot of of anxiety and stress in her job situation. Still working on self-doubt. Overthinking. States she is working on her perfectionism as this strongly correlates with her overthinking and discussed in prior sessions. Not worrying as much while at work and not feeling as strongly that she is letting people down, as described last session.    Interventions: Cognitive  Behavioral Therapy, Solution-Oriented/Positive Psychology, and Insight-Oriented  Long term goal:  1. Stabilize anxiety level while increasing ability to function on a daily basis.  2. Patient will eventually progress to where she rates her anxiety as a "3" or less on "1-10 Anxiety scale" for at least 2 months. Short term goal: Increase understanding of beliefs and messages that produce anxiety, worry,fear, and negativity.  Strategies: Identify, challenge, and replace anxious/fearful/negative with positive, hopeful, and empoweriing self-talk.   Diagnosis:   ICD-10-CM   1. Generalized anxiety disorder  F41.1      Plan:  Patient today showing good motivation and participation in session as she focused on her anxiety, sadness, fearful thoughts, and assuming the worst in situations relating to her job and to her dog who has developed a sudden health concerns.  Patient able to talk through her anxiety and fearfulness about her dog's illness and also some concerns that her create more stress at her job.  Did seem to become more grounded by the end of session and trying to interrupt the over thinking and assuming worst case scenarios.  Had been feeling stronger within herself and less avoidant of situations until current stressors heightened this past week and has affected patient's coping with uncertainties.  In addition to being more grounded, she did also seem more motivated and hopeful as she was able to reflect on her strengths more than her fears. Encouraged patient in her practice of more positive behaviors including: Believing in herself to be able  to make more changes in positive directions, getting outside daily and walking, spending time with her dog which is very therapeutic for her, reflecting her progress often, stay in the present focusing on what she can control or change, refraining from jumping ahead and assuming worst case scenarios, reaching out to people in her support network, healthy  nutrition and exercise, interrupt/challenge her anxious/negative/depressive/fearful thoughts to be more reality based and empowering, let go of negative thoughts and assumptions that hold her back, continue working and adjusting more easily when things do not go as planned or are out of her control, stop self negating, practice more consistent positive self talk, intentionally look for positives within herself and be able to name them, looking for positives within others, practicing forgiveness of others and herself, and recognize the strength she shows when working with goal directed behaviors to move in a direction that supports her improved emotional health.  Review and progress/challenges noted with patient.  Next appointment within 2 to 3 weeks.  This record has been created using [employer].  Chart creation errors have been sought, but may not always have been located and corrected.  Such creation errors do not reflect on the standard of medical care provided.   Mathis Fare, LCSW

## 2022-02-11 ENCOUNTER — Ambulatory Visit (INDEPENDENT_AMBULATORY_CARE_PROVIDER_SITE_OTHER): Payer: BC Managed Care – PPO | Admitting: Psychiatry

## 2022-02-11 DIAGNOSIS — F411 Generalized anxiety disorder: Secondary | ICD-10-CM

## 2022-02-11 NOTE — Progress Notes (Signed)
[facility] Counselor/Therapist Progress Note  Patient ID: Mariah Young, MRN: [id number],    Date: 02/11/2022  Time Spent: 55 minutes   Treatment Type: Individual Therapy  Reported Symptoms: anxiety, depression, not sleeping as well "due to her dog and her job"  Mental Status Exam:  Appearance:   Neat     Behavior:  Appropriate, Sharing, and Motivated  Motor:  Normal  Speech/Language:   Negative  Affect:  Depressed and anxious  Mood:  anxious and depressed  Thought process:  goal directed  Thought content:    Some obsessiveness and overthinking  Sensory/Perceptual disturbances:    WNL  Orientation:  oriented to person, place, time/date, situation, day of week, month of year, year, and stated date of February 11, 2022  Attention:  Good  Concentration:  Good  Memory:  WNL  Fund of knowledge:   Good  Insight:    Good  Judgment:   Good  Impulse Control:  Fair/Good   Risk Assessment: Danger to Self:  No Self-injurious Behavior: No Danger to Others: No Duty to Warn:no Physical Aggression / Violence:No  Access to Firearms a concern: No  Gang Involvement:No   Subjective:  Patient in today reporting anxiety and depression mostly related to personal, job, and her dog. Tired with commuting with her job/training.Some tearfulness re: her dog's surgery more serious than expected and waiting to hear pathology report. Processed this more in session today, as her dog has been a very therapeutic presence for patient. 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 Working on decreasing her overthinking and perfectionism. Not worrying as much about "letting people down". Thought processes about herself are some healthier and less self-doubt but also very stressed  with her personal circumstances.   Interventions: Cognitive Behavioral Therapy  Long term goal:  1. Stabilize anxiety level while increasing ability to function on a daily basis.  2. Patient will eventually progress to where she rates her anxiety as a "3" or less on "1-10 Anxiety scale" for at least 2 months. Short term goal: Increase understanding of beliefs and messages that produce anxiety, worry,fear, and negativity.  Strategies: Identify, challenge, and replace anxious/fearful/negative with positive, hopeful, and empoweriing self-talk.  Diagnosis:   ICD-10-CM   1. Generalized anxiety disorder  F41.1      Plan: Patient today showing good motivation and active participation in session today as she tearfully worked on multiple stressors especially her anxiety the critical surgery and health issues of her 25 yr old dog. Also working on work-related frustrations and stressors that have been difficult for patient at a time when she's already struggling regarding her dog's surgery and waiting for the pathology report and learning next steps.  Working on "not assuming the worst nor assuming the very best but rather trying to stay neutral and having the confidence that she can deal with whatever the outcome may be and has the support as needed."  This seemed helpful to patient and states that she is hopeful of hearing more from the vet within a couple days.  Was able to interrupt some of her anxious thinking more towards the end of session and seemed to feel more strength and confidence in herself. Encouraged patient in practicing more positive behaviors including: Spending  time with her dog which is very therapeutic for her, getting outside daily and walking, believing in herself more that she can make changes to move and positive directions, reflecting on her progress often, stay in the present focusing on what she can control or change, refrain from jumping ahead and assuming worst case scenarios,  healthy nutrition and exercise, reaching out to people in her support network, interrupt/challenge her anxious/negative thoughts to be more reality based and empowering, let go of negative thoughts and assumptions that hold her back, continue working with adjusting more easily when things do not go as planned or are out of her control, refrain from self negating, positive self talk, intentionally look for more positives within herself and be able to name them, look for positives within others, practice forgiveness of others and herself, and realize the strength she shows working with goal directed behaviors to move in a direction that supports her improved emotional health and overall outlook.  Goal review and progress/challenges noted with patient.  Next appointment within 2 to 3 weeks.  This record has been created using [employer].  Chart creation errors have been sought, but may not always have been located and corrected.  Such creation errors do not reflect on the standard of medical care provided.   Mathis Fare, LCSW

## 2022-02-14 IMAGING — CT CT NECK W/ CM
3 series · 8 of 14 positions shown, 9 images · IV contrast (omnipaque)
Comparison: None available.

CLINICAL DATA: Initial evaluation for acute sore throat.

EXAM:
CT NECK WITH CONTRAST
TECHNIQUE: Multidetector CT imaging of the neck was performed using the
standard protocol following the bolus administration of intravenous
contrast.
CONTRAST:  75mL OMNIPAQUE IOHEXOL 300 MG/ML  SOLN

[Series 2: axial neck (person_name) · axial · 0.58mm/px · z∈[-559,-489]mm · 2 of 106 slices shown]
[im 36/106  bone]
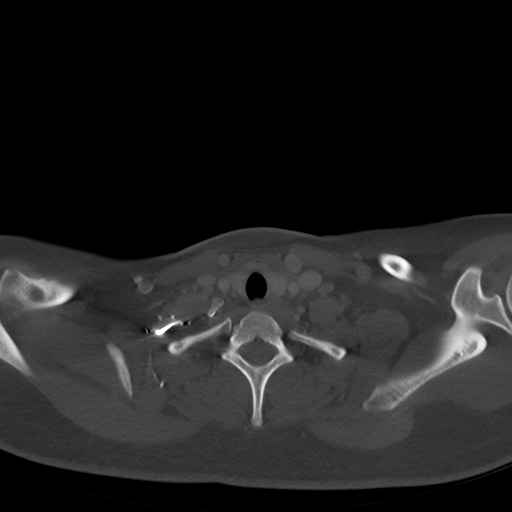
[im 71/106  bone]
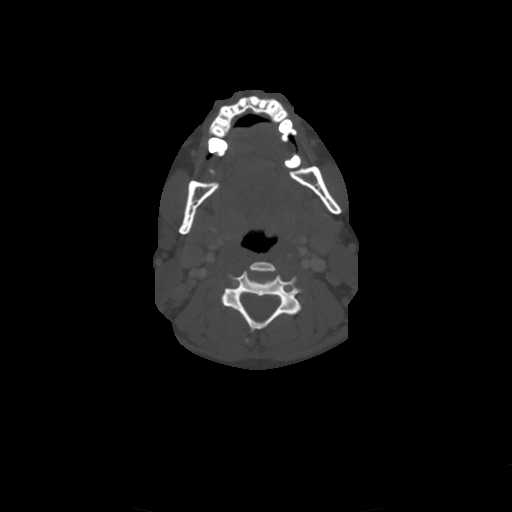

[Series 3: ax bone · axial · 0.58mm/px · z∈[-577,-473]mm · 3 of 106 slices shown]
[im 27/106  bone]
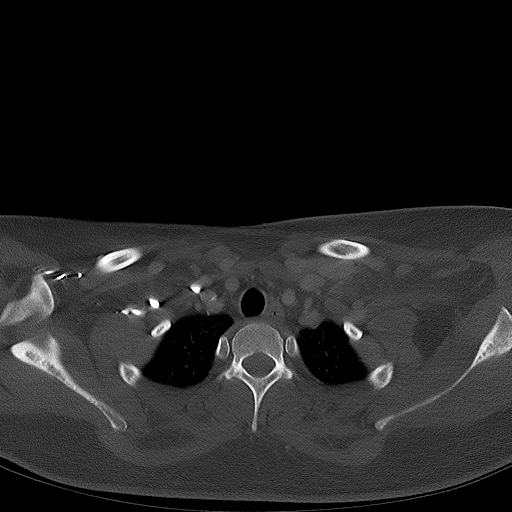
[im 53/106  bone]
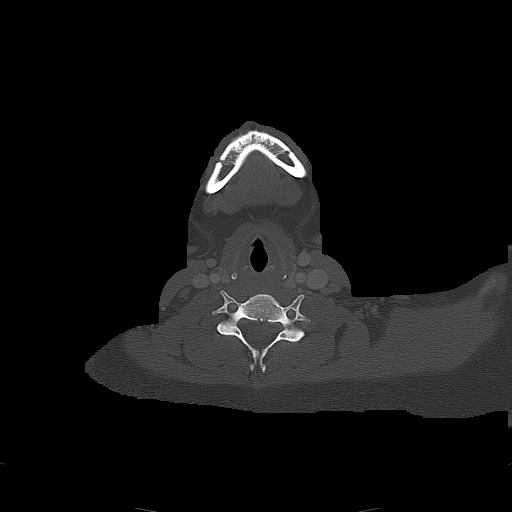
[im 79/106  bone]
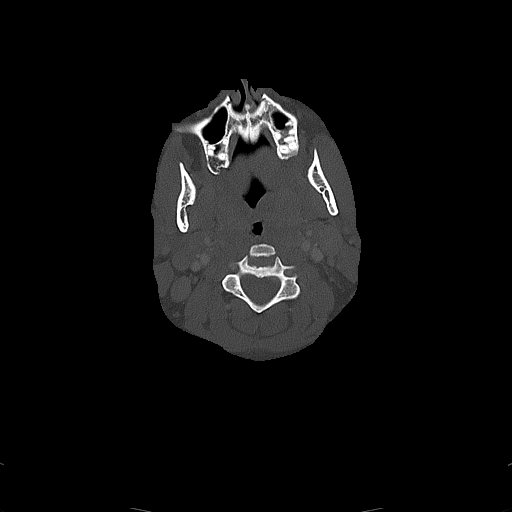

[Series 6: ax oropharynx · axial · 0.49mm/px · z∈[-608,-502]mm · 3 of 108 slices shown, 4 images]
[im 27/108  soft-tissue]
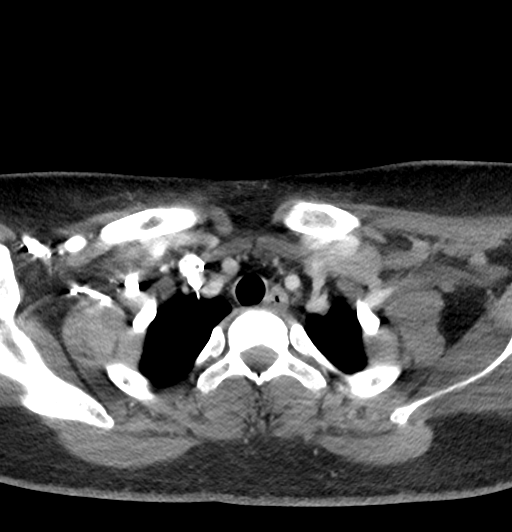
[im 27/108  bone]
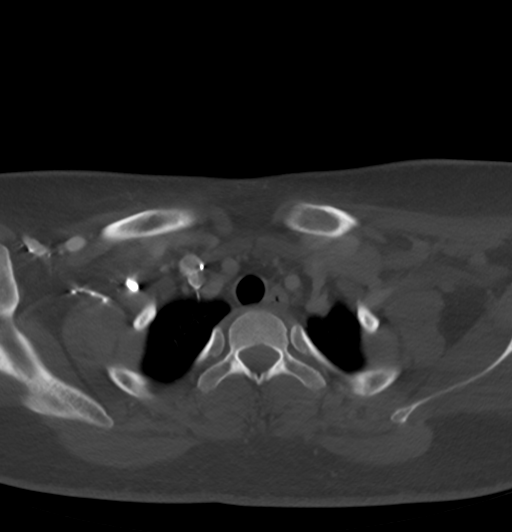
[im 54/108  bone]
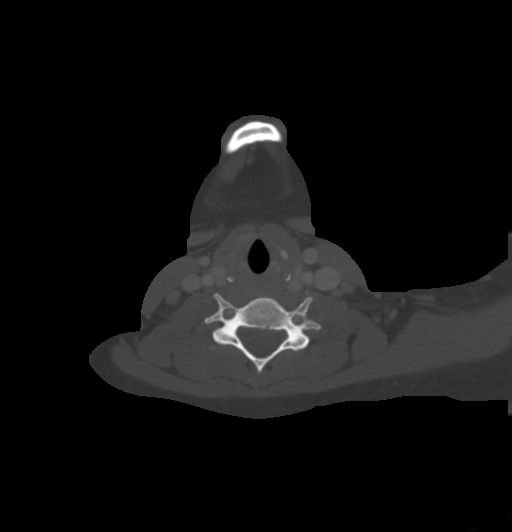
[im 81/108  bone]
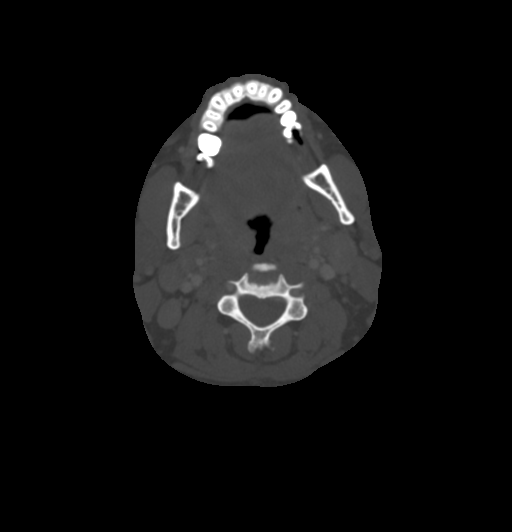

[8 of 14 positions shown; findings below may reference images not displayed]

FINDINGS: Pharynx and larynx: Oral cavity within normal limits. No acute
abnormality about the dentition. Palatine and lingual tonsils are
hypertrophied and hyperenhancing, suggesting acute tonsillitis. No
discrete tonsillar or peritonsillar abscess. Parapharyngeal fat
maintained. Associated mild mucosal edema within the oropharynx,
suggesting associated pharyngitis. Nasopharynx normal. No
retropharyngeal collection or swelling. Epiglottis within normal
limits. Remainder of the hypopharynx and supraglottic larynx within
normal limits. Glottis normal. Subglottic airway patent clear.

Salivary glands: Parotid and submandibular glands are normal.

Thyroid: Normal.

Lymph nodes: Enlarged bilateral level II lymph nodes measure up to
1.7 cm on the left and 1.4 cm on the right, presumably reactive. No
other enlarged or pathologic adenopathy within the neck.

Vascular: Normal intravascular enhancement seen throughout the neck.

Limited intracranial: Unremarkable.

Visualized orbits: Unremarkable.

Mastoids and visualized paranasal sinuses: Visualized paranasal
sinuses are clear. Visualized mastoids and middle ear cavities are
well pneumatized and free of fluid.

Skeleton: No acute osseous finding. No discrete or worrisome osseous
lesions.

Upper chest: Visualized upper chest demonstrates no acute finding.
Partially visualized lungs are clear.

Other: None.
IMPRESSION: 1. Findings consistent with acute tonsillitis/pharyngitis as above.
No discrete tonsillar or peritonsillar abscess.
2. Enlarged bilateral level II lymph nodes, presumably reactive.

## 2022-03-06 ENCOUNTER — Ambulatory Visit (INDEPENDENT_AMBULATORY_CARE_PROVIDER_SITE_OTHER): Payer: BC Managed Care – PPO | Admitting: Psychiatry

## 2022-03-06 DIAGNOSIS — F411 Generalized anxiety disorder: Secondary | ICD-10-CM

## 2022-03-06 NOTE — Progress Notes (Signed)
[facility] Counselor/Therapist Progress Note  Patient ID: Mariah Young, MRN: [id number],    Date: 03/06/2022  Time Spent: 55 minutes   Treatment Type: Individual Therapy  Reported Symptoms: anxiety, depression, self-critical mostly at work  Mental Status Exam:  Appearance:   Neat     Behavior:  Appropriate, Sharing, and Motivated  Motor:  Normal  Speech/Language:   Clear and Coherent  Affect:  Anxiety, some depression  Mood:  anxious and depressed  Thought process:  goal directed  Thought content:    Obsessions and obsessive thoughts  Sensory/Perceptual disturbances:    WNL  Orientation:  oriented to person, place, time/date, situation, day of week, month of year, year, and stated date of March 06, 2022  Attention:  Good  Concentration:  Good  Memory:  WNL  Fund of knowledge:   Good  Insight:    Good  Judgment:   Good  Impulse Control:  Good   Risk Assessment: Danger to Self:  No Self-injurious Behavior: No Danger to Others: No Duty to Warn:no Physical Aggression / Violence:No  Access to Firearms a concern: No  Gang Involvement:No   Subjective:  Patient in today reporting anxiety and depression. Is relieved that her dog's tumor was not cancerous and is being successfully treated. Relationship with BF seems better more recently. He is moving to different apt that feels more safe. Patient visiting him in [place] a couple times per month. Had her first on-call weekend for work that "was horrible" and very stressful, and processed her feelings about the stress of the weekend in session today, which she felt was helpful to her in "de-stressing" but also in realizing she needs to set some personal boundaries, not look for worst case scenarios, and reduce her overthinking. Shared some feedback she got at work that she is concerned about as "they always say I do good I something BUT...." and is feeling she can never be good enough. Has also received some good feedback  from others over her, and I encouraged patient to see the "good feedback" in addition to the feedback that includes suggestions for some change. Struggles with not taking things personally and "that exhausts me." Discussed how she could more intentionally use some self care strategies to feel better about herself and less self-critical, being able to see her positives.    Interventions: Cognitive Behavioral Therapy and Ego-Supportive  Long term goal:  1. Stabilize anxiety level while increasing ability to function on a daily basis.  2. Patient will eventually progress to where she rates her anxiety as a "3" or less on "1-10 Anxiety scale" for at least 2 months. Short term goal: Increase understanding of beliefs and messages that produce anxiety, worry,fear, and negativity.  Strategies: Identify, challenge, and replace anxious/fearful/negative with positive, hopeful, and empoweriing self-talk.  Diagnosis:   ICD-10-CM   1. Generalized anxiety disorder  F41.1      Plan:  Patient today actively participating in session and showing good motivation working on her anxiety, some depression, and self-criticalness. Is improving and rebound time from things that upset her is lessening. Worked on focusing more on her strengths vs "need areas", which seemed very helpful to patient.  She and her BF seem to have pulled closer together and he is a positive support her outpatient.  Patient reports making some progress on not assuming the negatives or worst case scenarios as often.  Followed up from last session and working on "trying to stay in "neutral" during stressful  times (versus assuming the worst and over worrying/overthinking) and have more confidence in herself to deal with whenever she has to deal with and that she can make good decisions and have good outcomes going forward. Encouraged patient in her practice of positive behaviors including: Getting outside daily and walking, believing more in herself  that she can continue to make changes to move forward in positive directions, spending time with her dog which is very therapeutic for her, reflecting on her progress often, remaining in the present focusing on what she can control or change, refrain from jumping ahead and assuming worst case scenarios, healthy nutrition and exercise, reaching out to people in her support network, interrupt/challenge her anxious/negative thoughts to be more reality based and empowering, letting go of negative thoughts and assumptions that hold her back, continue working with adjusting more easily when things do not go as planned or out of her control, stop self negating, use positive self-talk, intentionally look for more positives within herself, practice forgiveness of others and herself, and recognize the strength she shows working with goal directed behaviors to move in a direction that supports her improved emotional health.  Goal review and progress/challenges noted with patient.  Next appointment within 3 weeks.  This record has been created using [employer].  Chart creation errors have been sought, but may not always have been located and corrected.  Such creation errors do not reflect on the standard of medical care provided.    Mathis Fare, LCSW

## 2022-03-27 ENCOUNTER — Ambulatory Visit: Payer: BC Managed Care – PPO | Admitting: Psychiatry

## 2022-04-01 ENCOUNTER — Ambulatory Visit (INDEPENDENT_AMBULATORY_CARE_PROVIDER_SITE_OTHER): Payer: BC Managed Care – PPO | Admitting: Psychiatry

## 2022-04-01 DIAGNOSIS — F411 Generalized anxiety disorder: Secondary | ICD-10-CM

## 2022-04-01 NOTE — Progress Notes (Signed)
[facility] Counselor/Therapist Progress Note  Patient ID: Mariah Young, MRN: [id number],    Date: 04/01/2022  Time Spent: 57 minutes   Treatment Type: Individual Therapy  Reported Symptoms: anxiety, depression, physically and mentally tired  Mental Status Exam:  Appearance:   Casual     Behavior:  Appropriate, Sharing, and Motivated  Motor:  Normal  Speech/Language:   Clear and Coherent  Affect:  Depressed and anxious  Mood:  anxious and depressed  Thought process:  goal directed  Thought content:    overthinking  Sensory/Perceptual disturbances:    WNL  Orientation:  oriented to person, place, time/date, situation, day of week, month of year, year, and stated date of Sept. 11, 2023  Attention:  Good  Concentration:  Good  Memory:  WNL  Fund of knowledge:   Good  Insight:    Good and Fair  Judgment:   Good  Impulse Control:  Good and Fair   Risk Assessment: Danger to Self:  No Self-injurious Behavior: No Danger to Others: No Duty to Warn:no Physical Aggression / Violence:No  Access to Firearms a concern: No  Gang Involvement:No   Subjective: Patient in today reporting anxiety, depression, and physically/emotionally tired, and tearfulness with anxiety being her strongest reported symptom. "Overwhelmed with work (healthcare) and life." Lots of work challenges and actually broke down in tears recently at work. Needed session today to share and process her thoughts and feelings regarding work and personal stressors.  Tearful. Was able to talk and work through the challenges she has been facing and continues to face, as well as discuss what could help her trying to move forward. Does sense pressure on the job and concerned about some of the rules there. Also senses her adjustment issues in training with multiple different trainers and is experiencing a wide difference in their behaviors and communication.  Also needing to work on better personal boundaries, reducing her  overthinking, and not looking for worst case scenarios.  Still having some feelings at work that she is not "good enough" as others will sometimes say "you did well, but.", Which tends to lead patient to feel she is not doing well enough.  Encouraged patient to also reflect on some of the positive feedback she has received from her direct supervisor as well as others.  She admits that she has issues with taking criticism very personally but feels lately there is just been too many stressors to deal with both work and outside of work and that has depleted her energy some as well as her ability to handle feedback that feels negative.  Interventions: Cognitive Behavioral Therapy and Ego-Supportive   Long term goal:  1. Stabilize anxiety level while increasing ability to function on a daily basis.  2. Patient will eventually progress to where she rates her anxiety as a "3" or less on "1-10 Anxiety scale" for at least 2 months. Short term goal: Increase understanding of beliefs and messages that produce anxiety, worry,fear, and negativity.  Strategies: Identify, challenge, and replace anxious/fearful/negative with positive, hopeful, and empoweriing self-talk.  Diagnosis:   ICD-10-CM   1. Generalized anxiety disorder  F41.1      Plan: Patient today showing good motivation and participation in session today as she processed a lot of her anxiety, depression, feeling emotionally and physically exhausted, as well as tearfulness that is a part of all of this.  Seemed to help her to really vent freely today and talked through situations to gain a broader  perspective as what is going on with her and her work/life stressors.  BF is getting moved into his apartment this week and that will be a plus for her as he is a big support for patient.  Work today more on her increasing personal boundaries, not assuming the worst case scenarios, and definitely reducing her overthinking.  Self criticalness not as bad towards  the end of session and she stated she was tired but that it "was not good, tired after having talked through so much stress and feeling hurt and validated in session.  Relationship with BF does seem to be getting closer and he seems to be a really good support for her and she for him.  Trying to believe more in herself that she can truly make good decisions, have good outcomes in the future, and be able to handle situations more confidently whether the outcomes are good or not quite what she had hoped for. Encouraged patient in her practice of positive behaviors including: Believing more in herself that she can continue to make changes to move forward in positive directions, spending time with her dog which is very therapeutic for her, getting outside daily and walking, reflecting on her progress more often, stay in the present focusing on what she can control or change, refrain from jumping ahead and assuming worst case scenarios, healthy nutrition and exercise, reach out to people in her support network, interrupt/challenge her anxious/negative thoughts to be more reality based and empowering, letting go of negative thoughts and assumptions that hold her back, continue working with adjusting more easily when things do not go as planned or are out of her control, stop self negating, intentionally look for more positives within herself, practice forgiveness of others and herself, use of more positive self talk, and recognize the strength she shows when working with goal directed behaviors to move in a direction that supports her improved emotional health and overall wellbeing.  Goal review and progress/challenges noted with patient.  Next appointment within approximately 2 weeks.  This record has been created using [employer].  Chart creation errors have been sought, but may not always have been located and corrected.  Such creation errors do not reflect on the standard of medical care  provided.   Mathis Fare, LCSW

## 2022-04-03 ENCOUNTER — Encounter: Payer: Self-pay | Admitting: Adult Health

## 2022-04-03 ENCOUNTER — Ambulatory Visit (INDEPENDENT_AMBULATORY_CARE_PROVIDER_SITE_OTHER): Payer: BC Managed Care – PPO | Admitting: Adult Health

## 2022-04-03 DIAGNOSIS — F902 Attention-deficit hyperactivity disorder, combined type: Secondary | ICD-10-CM | POA: Diagnosis not present

## 2022-04-03 DIAGNOSIS — F411 Generalized anxiety disorder: Secondary | ICD-10-CM | POA: Diagnosis not present

## 2022-04-03 DIAGNOSIS — F422 Mixed obsessional thoughts and acts: Secondary | ICD-10-CM

## 2022-04-03 DIAGNOSIS — F410 Panic disorder [episodic paroxysmal anxiety] without agoraphobia: Secondary | ICD-10-CM

## 2022-04-03 MED ORDER — ALPRAZOLAM 1 MG PO TABS: 1.0000 mg | ORAL_TABLET | Freq: Two times a day (BID) | ORAL | 0 refills | Status: AC | PRN

## 2022-04-03 MED ORDER — AMPHETAMINE-DEXTROAMPHETAMINE 20 MG PO TABS: 20.0000 mg | ORAL_TABLET | Freq: Every day | ORAL | 0 refills | Status: DC

## 2022-04-03 MED ORDER — AMPHETAMINE-DEXTROAMPHET ER 25 MG PO CP24: ORAL_CAPSULE | ORAL | 0 refills | Status: DC

## 2022-04-03 MED ORDER — DESVENLAFAXINE SUCCINATE ER 100 MG PO TB24: 100.0000 mg | ORAL_TABLET | Freq: Every day | ORAL | 1 refills | Status: DC

## 2022-04-03 NOTE — Progress Notes (Signed)
Mariah Young [id number] 1996/05/07 26 y.o.  Subjective:   Patient ID:  Mariah Young is a 26 y.o. (DOB [birth year]) female.  Chief Complaint: No chief complaint on file.   HPI Alysabeth Scalia presents to the office today for follow-up of ADHD, panic disorder, GAD, and obsessional thoughts.   Describes mood today as "ok". Flat. Decreased tearfulness. Mood symptoms - reports decreased depression. Increased anxiety at times. Denies irritability. Denies panic attacks. Mood is consistent. Stating "I think I'm doing ok". Feels like medications are working well. She and partner doing well. Improved interest and motivation. Working with therapist - Rockne Menghini. Taking medications as prescribed. Energy levels stable. Active, does not have a regular exercise routine.  Enjoys some usual interests and activities. In a relationship. Lives alone with dog - "[name]". Family in [place]. Appetite adequate. Weight stable. Sleep is improving. Averages 6 hours. Focus and concentration stable. Completing tasks. Managing aspects of household. Working full time.  Denies SI or HI.  Denies AH or VH. Denies self harm. Denies substance use.  Previous medication trials: Prozac   GAD-7    Flowsheet Row Office Visit from 11/14/2020 in [facility]  Total GAD-7 Score 11      PHQ2-9    Flowsheet Row Office Visit from 11/14/2020 in [facility]  PHQ-2 Total Score 3  PHQ-9 Total Score 14      Flowsheet Row ED from 03/08/2021 in [facility]  ED from 02/22/2021 in [facility] ED from 11/02/2020 in [facility]  C-SSRS RISK CATEGORY No Risk No Risk No Risk        Review of Systems:  Review of Systems  Musculoskeletal:  Negative for gait problem.  Neurological:  Negative for tremors.  Psychiatric/Behavioral:         Please  refer to HPI    Medications: I have reviewed the patient's current medications.  Current Outpatient Medications  Medication Sig Dispense Refill   ALPRAZolam (XANAX) 1 MG tablet Take 1 tablet (1 mg total) by mouth 2 (two) times daily as needed for anxiety. 180 tablet 0   amphetamine-dextroamphetamine (ADDERALL XR) 25 MG 24 hr capsule Take one capsule twice daily. 60 capsule 0   amphetamine-dextroamphetamine (ADDERALL XR) 25 MG 24 hr capsule Take 2 capsules by mouth daily after breakfast. 180 capsule 0   amphetamine-dextroamphetamine (ADDERALL XR) 25 MG 24 hr capsule Take two capsules by mouth daily after breakfast. 180 capsule 0   amphetamine-dextroamphetamine (ADDERALL) 20 MG tablet Take 1 tablet (20 mg total) by mouth daily. 30 tablet 0   amphetamine-dextroamphetamine (ADDERALL) 20 MG tablet Take 1 tablet (20 mg total) by mouth daily before supper. 30 tablet 0   amphetamine-dextroamphetamine (ADDERALL) 20 MG tablet Take 1 tablet (20 mg total) by mouth daily. 90 tablet 0   desvenlafaxine (PRISTIQ) 100 MG 24 hr tablet Take 1 tablet (100 mg total) by mouth daily. 90 tablet 1   Multiple Vitamin (ONE-DAILY MULTI-VITAMIN) TABS Take 1 tablet by mouth daily.     No current facility-administered medications for this visit.    Medication Side Effects: None  Allergies:  Allergies  Allergen Reactions   Clarithromycin Other (See Comments) and Rash    Vasculitis Vasculitis  Other reaction(s): Unknown   Penicillins Other (See Comments)    Other reaction(s): Intolerance vasculitis vasculitis    Penicillin G     Other reaction(s): Unknown  Past Medical History:  Diagnosis Date   Acute non-recurrent maxillary sinusitis 07/12/2017   ADHD (attention deficit hyperactivity disorder)    Anxiety    Cough 07/12/2017   Diaphoresis    Dysmenorrhea    IUD (intrauterine device) in place    Obesity (BMI 30.0-34.9)    Obsessive compulsive disorder     Past Medical History, Surgical history,  Social history, and Family history were reviewed and updated as appropriate.   Please see review of systems for further details on the patient's review from today.   Objective:   Physical Exam:  There were no vitals taken for this visit.  Physical Exam Constitutional:      General: She is not in acute distress. Musculoskeletal:        General: No deformity.  Neurological:     Mental Status: She is alert and oriented to person, place, and time.     Coordination: Coordination normal.  Psychiatric:        Attention and Perception: Attention and perception normal. She does not perceive auditory or visual hallucinations.        Mood and Affect: Mood normal. Mood is not anxious or depressed. Affect is not labile, blunt, angry or inappropriate.        Speech: Speech normal.        Behavior: Behavior normal.        Thought Content: Thought content normal. Thought content is not paranoid or delusional. Thought content does not include homicidal or suicidal ideation. Thought content does not include homicidal or suicidal plan.        Cognition and Memory: Cognition and memory normal.        Judgment: Judgment normal.     Comments: Insight intact     Lab Review:     Component Value Date/Time   NA 140 05/14/2021 1049   K 4.6 05/14/2021 1049   CL 101 05/14/2021 1049   CO2 22 05/14/2021 1049   GLUCOSE 90 05/14/2021 1049   GLUCOSE 95 11/14/2020 1007   BUN 12 05/14/2021 1049   CREATININE 0.70 05/14/2021 1049   CALCIUM 10.0 05/14/2021 1049   PROT 7.7 05/14/2021 1049   ALBUMIN 5.1 (H) 05/14/2021 1049   AST 17 05/14/2021 1049   ALT 13 05/14/2021 1049   ALKPHOS 76 05/14/2021 1049   BILITOT 0.2 05/14/2021 1049   GFRNONAA >60 11/14/2020 1007       Component Value Date/Time   WBC 7.5 05/14/2021 1049   WBC 10.5 11/02/2020 0828   RBC 4.85 05/14/2021 1049   RBC 4.28 11/02/2020 0828   HGB 15.1 05/14/2021 1049   HCT 44.1 05/14/2021 1049   PLT 256 11/02/2020 0828   MCV 91 05/14/2021  1049   MCH 31.1 05/14/2021 1049   MCH 30.4 11/02/2020 0828   MCHC 34.2 05/14/2021 1049   MCHC 34.1 11/02/2020 0828   RDW 12.6 05/14/2021 1049   LYMPHSABS 2.3 05/14/2021 1049   MONOABS 0.6 11/02/2020 0828   EOSABS 0.0 05/14/2021 1049   BASOSABS 0.0 05/14/2021 1049    No results found for: "POCLITH", "LITHIUM"   No results found for: "PHENYTOIN", "PHENOBARB", "VALPROATE", "CBMZ"   .res Assessment: Plan:     Plan:  PDMP reviewed  Continue:  Adderall XR 25mg  capsule daily Adderall 20mg  daily - not using as much Xanax 1mg  BID Pristiq 100mg  every morning  107/71/84 - will monitor between visits  RTC 3 months  Patient advised to contact office with any questions, adverse effects, or acute  worsening in signs and symptoms.  Discussed potential benefits, risk, and side effects of benzodiazepines to include potential risk of tolerance and dependence, as well as possible drowsiness.  Advised patient not to drive if experiencing drowsiness and to take lowest possible effective dose to minimize risk of dependence and tolerance.  Discussed potential benefits, risks, and side effects of stimulants with patient to include increased heart rate, palpitations, insomnia, increased anxiety, increased irritability, or decreased appetite.  Instructed patient to contact office if experiencing any significant tolerability issues. Diagnoses and all orders for this visit:  Attention deficit hyperactivity disorder (ADHD), combined type, moderate -     amphetamine-dextroamphetamine (ADDERALL XR) 25 MG 24 hr capsule; Take two capsules by mouth daily after breakfast. -     amphetamine-dextroamphetamine (ADDERALL) 20 MG tablet; Take 1 tablet (20 mg total) by mouth daily.  Generalized anxiety disorder -     desvenlafaxine (PRISTIQ) 100 MG 24 hr tablet; Take 1 tablet (100 mg total) by mouth daily. -     ALPRAZolam (XANAX) 1 MG tablet; Take 1 tablet (1 mg total) by mouth 2 (two) times daily as needed for  anxiety.  Mixed obsessional thoughts and acts -     desvenlafaxine (PRISTIQ) 100 MG 24 hr tablet; Take 1 tablet (100 mg total) by mouth daily. -     ALPRAZolam (XANAX) 1 MG tablet; Take 1 tablet (1 mg total) by mouth 2 (two) times daily as needed for anxiety.  Panic disorder -     ALPRAZolam (XANAX) 1 MG tablet; Take 1 tablet (1 mg total) by mouth 2 (two) times daily as needed for anxiety.     Please see After Visit Summary for patient specific instructions.  Future Appointments  Date Time Provider Department Center  05/01/2022  5:00 PM Mathis Fare, LCSW [facility] None  05/14/2022  5:00 PM Mathis Fare, LCSW [facility] None  05/16/2022  3:30 PM Early, Sung Amabile, NP [facility]  06/04/2022  5:00 PM Mathis Fare, LCSW [facility] None  06/25/2022  4:20 PM Jaquanna Ballentine, Thereasa Solo, NP [facility] None  06/25/2022  5:00 PM Mathis Fare, LCSW [facility] None  07/18/2022  5:00 PM Mathis Fare, LCSW [facility] None  08/07/2022  5:00 PM Mathis Fare, LCSW [facility] None    No orders of the defined types were placed in this encounter.   -------------------------------

## 2022-04-05 ENCOUNTER — Telehealth: Payer: Self-pay

## 2022-04-05 NOTE — Telephone Encounter (Signed)
Prior Authorization submitted for ADDERALL XR 25 MG #180/90 DAY, not sure they will approve a 90 day for a stimulant and it is also BRAND name, pending response.

## 2022-04-07 NOTE — Telephone Encounter (Signed)
Prior Approval received on 04/05/2022, [employer] will fax specific dates

## 2022-04-08 NOTE — Telephone Encounter (Signed)
Prior approval effective 04/05/2022-04/05/2023 for ADDERALL XR 25 MG CAP

## 2022-04-11 ENCOUNTER — Other Ambulatory Visit: Payer: Self-pay

## 2022-04-11 ENCOUNTER — Telehealth: Payer: Self-pay | Admitting: Adult Health

## 2022-04-11 DIAGNOSIS — F902 Attention-deficit hyperactivity disorder, combined type: Secondary | ICD-10-CM

## 2022-04-11 MED ORDER — AMPHETAMINE-DEXTROAMPHET ER 25 MG PO CP24: ORAL_CAPSULE | ORAL | 0 refills | Status: DC

## 2022-04-11 NOTE — Telephone Encounter (Signed)
Next visit is 06/25/22. Mariah Young states that Adderall XR 25 mg is not in stock at   [id number] [address] [place], [place]  But is in stock at:  [facility], [address], [place], [place] [address], Phone number is [phone].  She is going out of town later this evening and would like to pick it up before she leaves.

## 2022-04-11 NOTE — Telephone Encounter (Signed)
Pended.

## 2022-04-22 ENCOUNTER — Other Ambulatory Visit: Payer: Self-pay

## 2022-04-22 ENCOUNTER — Emergency Department (HOSPITAL_BASED_OUTPATIENT_CLINIC_OR_DEPARTMENT_OTHER)
Admission: EM | Admit: 2022-04-22 | Discharge: 2022-04-22 | Disposition: A | Discharge status: Home / Self Care | Payer: Worker's Compensation | Attending: Emergency Medicine | Admitting: Emergency Medicine

## 2022-04-22 ENCOUNTER — Emergency Department (HOSPITAL_BASED_OUTPATIENT_CLINIC_OR_DEPARTMENT_OTHER): Payer: Worker's Compensation | Admitting: Radiology

## 2022-04-22 ENCOUNTER — Encounter (HOSPITAL_BASED_OUTPATIENT_CLINIC_OR_DEPARTMENT_OTHER): Payer: Self-pay

## 2022-04-22 DIAGNOSIS — Z20822 Contact with and (suspected) exposure to covid-19: Secondary | ICD-10-CM | POA: Insufficient documentation

## 2022-04-22 DIAGNOSIS — J069 Acute upper respiratory infection, unspecified: Secondary | ICD-10-CM | POA: Diagnosis not present

## 2022-04-22 DIAGNOSIS — Z227 Latent tuberculosis: Secondary | ICD-10-CM | POA: Insufficient documentation

## 2022-04-22 DIAGNOSIS — R799 Abnormal finding of blood chemistry, unspecified: Secondary | ICD-10-CM | POA: Diagnosis present

## 2022-04-22 LAB — RESP PANEL BY RT-PCR (FLU A&B, COVID) ARPGX2
Influenza A by PCR: NEGATIVE
Influenza B by PCR: NEGATIVE
SARS Coronavirus 2 by RT PCR: NEGATIVE

## 2022-04-22 LAB — GROUP A STREP BY PCR: Group A Strep by PCR: NOT DETECTED

## 2022-04-22 NOTE — ED Notes (Signed)
Quantiferon gold test is not available at this facility

## 2022-04-22 NOTE — ED Notes (Signed)
Discharge instructions and follow up care reviewed and explained. Pt advised that lab stated they did not have the testing supplies for a TB quant testing, pt was advised to follow up with the health department. Pt caox4, ambulatory, and denying SOB on d/c.

## 2022-04-22 NOTE — ED Provider Notes (Signed)
[facility] EMERGENCY DEPT Provider Note  CSN: [id number] Arrival date & time: 04/22/22 1648  Chief Complaint(s) Abnormal Lab  HPI Mariah Young is a 26 y.o. female without significant past medical history presenting to the emergency department with 1 week of cough, sore throat and runny nose, fatigue.  Denies night sweats, hemoptysis, weight loss.  Denies productive cough.  No chest pain.  She recently had a positive QuantiFERON test which she was also concerned about.  Past Medical History Past Medical History:  Diagnosis Date   Acute non-recurrent maxillary sinusitis 07/12/2017   ADHD (attention deficit hyperactivity disorder)    Anxiety    Cough 07/12/2017   Diaphoresis    Dysmenorrhea    IUD (intrauterine device) in place    Obesity (BMI 30.0-34.9)    Obsessive compulsive disorder    Patient Active Problem List   Diagnosis Date Noted   Encounter for annual physical exam 11/14/2020   Hematuria 11/14/2020   E-coli UTI 11/14/2020   Infectious mononucleosis without complication [id number]   Vasovagal syncope 11/14/2020   Generalized anxiety disorder 06/14/2019   Obsessive compulsive disorder 06/14/2019   Panic disorder 06/14/2019   Attention deficit hyperactivity disorder (ADHD), combined type 12/07/2018   Anxiety 12/07/2018   Home Medication(s) Prior to Admission medications   Medication Sig Start Date End Date Taking? Authorizing Provider  ALPRAZolam Duanne Moron) 1 MG tablet Take 1 tablet (1 mg total) by mouth 2 (two) times daily as needed for anxiety. 04/03/22   Mozingo, Berdie Ogren, NP  amphetamine-dextroamphetamine (ADDERALL XR) 25 MG 24 hr capsule Take one capsule twice daily. 02/25/22   Mozingo, Berdie Ogren, NP  amphetamine-dextroamphetamine (ADDERALL XR) 25 MG 24 hr capsule Take 2 capsules by mouth daily after breakfast. 01/01/22   Mozingo, Berdie Ogren, NP  amphetamine-dextroamphetamine (ADDERALL XR) 25 MG 24 hr capsule Take two capsules by  mouth daily after breakfast. 04/11/22   Mozingo, Berdie Ogren, NP  amphetamine-dextroamphetamine (ADDERALL) 20 MG tablet Take 1 tablet (20 mg total) by mouth daily. 01/28/22   Mozingo, Berdie Ogren, NP  amphetamine-dextroamphetamine (ADDERALL) 20 MG tablet Take 1 tablet (20 mg total) by mouth daily before supper. 02/25/22   Mozingo, Berdie Ogren, NP  amphetamine-dextroamphetamine (ADDERALL) 20 MG tablet Take 1 tablet (20 mg total) by mouth daily. 04/03/22   Mozingo, Berdie Ogren, NP  desvenlafaxine (PRISTIQ) 100 MG 24 hr tablet Take 1 tablet (100 mg total) by mouth daily. 04/03/22   Mozingo, Berdie Ogren, NP  Multiple Vitamin (ONE-DAILY MULTI-VITAMIN) TABS Take 1 tablet by mouth daily.    [provider]                                                                                                                                    Past Surgical History Past Surgical History:  Procedure Laterality Date   BREAST LUMPECTOMY Right 2014   BREAST SURGERY     REDUCTION MAMMAPLASTY Bilateral 2013  Family History Family History  Problem Relation Age of Onset   Hypertension Mother    Hypertension Father    Colon cancer Maternal Grandfather    Lung cancer Paternal Grandfather     Social History Social History   Tobacco Use   Smoking status: Never   Smokeless tobacco: Never  Vaping Use   Vaping Use: Never used  Substance Use Topics   Alcohol use: Yes   Drug use: Never   Allergies Clarithromycin, Penicillins, and Penicillin g  Review of Systems Review of Systems  All other systems reviewed and are negative.   Physical Exam Vital Signs  I have reviewed the triage vital signs BP 121/83 (BP Location: Right Arm)   Pulse 76   Temp 98.2 F (36.8 C)   Resp 18   Ht 5\' 8"  (1.727 m)   Wt 95.8 kg   SpO2 98%   BMI 32.11 kg/m  Physical Exam Vitals and nursing note reviewed.  Constitutional:      General: She is not in acute distress.    Appearance: She is  well-developed.  HENT:     Head: Normocephalic and atraumatic.     Mouth/Throat:     Mouth: Mucous membranes are moist.  Eyes:     Pupils: Pupils are equal, round, and reactive to light.  Cardiovascular:     Rate and Rhythm: Normal rate and regular rhythm.     Heart sounds: No murmur heard. Pulmonary:     Effort: Pulmonary effort is normal. No respiratory distress.     Breath sounds: Normal breath sounds.  Abdominal:     General: Abdomen is flat.     Palpations: Abdomen is soft.     Tenderness: There is no abdominal tenderness.  Musculoskeletal:        General: No tenderness.     Right lower leg: No edema.     Left lower leg: No edema.  Skin:    General: Skin is warm and dry.  Neurological:     General: No focal deficit present.     Mental Status: She is alert. Mental status is at baseline.  Psychiatric:        Mood and Affect: Mood normal.        Behavior: Behavior normal.     ED Results and Treatments Labs (all labs ordered are listed, but only abnormal results are displayed) Labs Reviewed  GROUP A STREP BY PCR  RESP PANEL BY RT-PCR (FLU A&B, COVID) ARPGX2                                                                                                                          Radiology DG Chest 2 View  Result Date: 04/22/2022 CLINICAL DATA:  SOB. 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 EXAM: CHEST - 2 VIEW COMPARISON:  None Available. FINDINGS: The heart and mediastinal contours are within normal limits. No focal consolidation. No  pulmonary edema. No pleural effusion. No pneumothorax. No acute osseous abnormality. IMPRESSION: No active cardiopulmonary disease. Electronically Signed   By: Iven Finn M.D.   On: 04/22/2022 17:58    Pertinent labs & imaging results that were available during my care of the patient were reviewed by me and considered in my medical decision making (see MDM for  details).  Medications Ordered in ED Medications - No data to display                                                                                                                                   Procedures Procedures  (including critical care time)  Medical Decision Making / ED Course   MDM:  26 year old female presenting with upper respiratory tract symptoms.  Patient well-appearing, exam with clear lungs, no focal findings.  Vital signs reassuring.  Chest x-ray clear without evidence of tuberculosis  Suspect upper respiratory tract infection, given clear chest x-ray doubt this is related to tuberculosis or pneumonia.  Patient does report positive QuantiFERON gold testing, advised patient that she may have latent tuberculosis and will likely need outpatient treatment with isoniazid.  Advised patient to follow-up with her employee health or [place] for further evaluation.  Doubt active tuberculosis at this time. Will discharge patient to home. All questions answered. Patient comfortable with plan of discharge. Return precautions discussed with patient and specified on the after visit summary.       Additional history obtained: -External records from outside source obtained and reviewed including: Chart review including previous notes, labs, imaging, consultation notes   Lab Tests: -I ordered, reviewed, and interpreted labs.   The pertinent results include:   Labs Reviewed  GROUP A STREP BY PCR  RESP PANEL BY RT-PCR (FLU A&B, COVID) ARPGX2     Imaging Studies ordered: I ordered imaging studies including CXR On my interpretation imaging demonstrates clear lungs I independently visualized and interpreted imaging. I agree with the radiologist interpretation   Medicines ordered and prescription drug management: No orders of the defined types were placed in this encounter.   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   Reevaluation: After the interventions noted above, I reevaluated the patient and found that they have stayed the same  Co morbidities that complicate the patient evaluation  Past Medical History:  Diagnosis Date   Acute non-recurrent maxillary sinusitis 07/12/2017   ADHD (attention deficit hyperactivity disorder)    Anxiety    Cough 07/12/2017   Diaphoresis    Dysmenorrhea    IUD (intrauterine device) in place    Obesity (BMI 30.0-34.9)    Obsessive compulsive disorder       Dispostion: Discharge    Final Clinical Impression(s) / ED Diagnoses Final diagnoses:  Viral URI  TB lung, latent  This chart was dictated using voice recognition software.  Despite best efforts to proofread,  errors can occur which can change the documentation meaning.    Cristie Hem, MD 04/22/22 [phone]

## 2022-04-22 NOTE — ED Triage Notes (Signed)
Patient here POV from Home.  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  Endorses associated Symptoms for approximately 1 Week including Fatigue, Productive Cough.  No Known Fevers. No New Night Sweats. No Blood in Cough.   NAD Noted during Triage. A&Ox4. GCS 15. Ambulatory.

## 2022-04-22 NOTE — Discharge Instructions (Addendum)
We evaluated you for you for your upper respiratory tract infection.  Your symptoms currently are probably not due to active tuberculosis.  Your chest x-ray was clear.  We do not have the equipment needed to redraw your TB test.   Please follow-up with your work health department or the [place] for treatment of your possible latent tuberculosis.  Please return to the emergency department if you have weight loss, coughing up blood, shortness of breath, or any other concerning symptoms.

## 2022-04-23 ENCOUNTER — Other Ambulatory Visit (HOSPITAL_BASED_OUTPATIENT_CLINIC_OR_DEPARTMENT_OTHER): Payer: Self-pay | Admitting: Nurse Practitioner

## 2022-04-23 ENCOUNTER — Telehealth (HOSPITAL_BASED_OUTPATIENT_CLINIC_OR_DEPARTMENT_OTHER): Payer: Self-pay

## 2022-04-23 ENCOUNTER — Encounter (HOSPITAL_BASED_OUTPATIENT_CLINIC_OR_DEPARTMENT_OTHER): Payer: Self-pay | Admitting: Nurse Practitioner

## 2022-04-23 NOTE — Telephone Encounter (Signed)
Patient called in wanting Mariah Young Early to write her a note after going to the ED

## 2022-04-23 NOTE — Telephone Encounter (Signed)
Patient called in wanting Mariah Young to write her a note to back to work.After going to the ED for positive TB test. The ED diagnosed her with upper respiratory tract infection. And advised her to follow up with the [facility] department for outpatient isoniazid treatment. Patient stated to [place] we were denying her care.  Per Laretta Bolster this information was relayed to the patient and that we were not denying care she needed to follow proper treatment with the health department as the ED instructed. Patient verbal understood this instructions and stated she would call the health department for treatment.

## 2022-04-24 ENCOUNTER — Ambulatory Visit: Payer: BC Managed Care – PPO | Admitting: Psychiatry

## 2022-05-01 ENCOUNTER — Ambulatory Visit (INDEPENDENT_AMBULATORY_CARE_PROVIDER_SITE_OTHER): Payer: BC Managed Care – PPO | Admitting: Psychiatry

## 2022-05-01 DIAGNOSIS — F411 Generalized anxiety disorder: Secondary | ICD-10-CM

## 2022-05-01 NOTE — Progress Notes (Signed)
[facility] Counselor/Therapist Progress Note  Patient ID: Mariah Young, MRN: [id number],    Date: 05/01/2022  Time Spent: 50 minutes   Treatment Type: Individual Therapy  Reported Symptoms: anxiety, depression  Mental Status Exam:  Appearance:   Casual     Behavior:  Appropriate, Sharing, and Motivated  Motor:  Normal  Speech/Language:   Clear and Coherent  Affect:  Depressed and anxious  Mood:  anxious and depressed  Thought process:  goal directed  Thought content:    Obsessive thoughts but "not as bad as they were"  Sensory/Perceptual disturbances:    WNL  Orientation:  oriented to person, place, time/date, situation, day of week, month of year, year, and stated date of Oct. 11, 2023  Attention:  Good  Concentration:  Good  Memory:  WNL  Fund of knowledge:   Good  Insight:    Good and Fair  Judgment:   Good  Impulse Control:  Good   Risk Assessment: Danger to Self:  No Self-injurious Behavior: No Danger to Others: No Duty to Warn:no Physical Aggression / Violence:No  Access to Firearms a concern: No  Gang Involvement:No   Subjective:  Patient in today reporting symptoms of anxiety, depression mostly related to personal, job, and [employer] training [employer] education [employer]. Things going some better and managing anxiety and depression. 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  Also quite stressed with unexpected work circumstances and processed this in session today, discussing her reactions in certain situations and discussing better ways of managing stress and when things do not go as planned or expected.  Used some specific examples in working on this with patient. Lots of stressors impacting patient recently including stressors with family and work. Feeling overwhelmed with some issues but also able to see some positives. Having trouble balancing work, sleep, life, family, relationship. Talked through the importance of her own self-care and work  together on some positive self-care needed immediately along with other priorities priorities for her as she is clearly overwhelmed with personal/work/family concerns.  Did state she was feeling better and more grounded by the end of session and urged her to follow through on the self-care and priorities as discussed near session end.  Interventions: Cognitive Behavioral Therapy and Ego-Supportive  Long term goal:  1. Stabilize anxiety level while increasing ability to function on a daily basis.  2. Patient will eventually progress to where she rates her anxiety as a "3" or less on "1-10 Anxiety scale" for at least 2 months. Short term goal: Increase understanding of beliefs and messages that produce anxiety, worry,fear, and negativity.  Strategies: Identify, challenge, and replace anxious/fearful/negative with positive, hopeful, and empoweriing self-talk.  Diagnosis:   ICD-10-CM   1. Generalized anxiety disorder  F41.1      Plan:  Patient today showing good participation and motivation in session working further on her anxiety and depression as the symptoms relate to her job, family, and work/life balance.  As noted above she worked hard in session today on these issues and how to better manage the stress, set healthier limits, allow for better sleep, remain on her medications as prescribed, healthy nutrition, walking or exercise as she is able, communicate her needs better, and provide better and more consistent self-care including encouraging self talk.  Continue to work on trying to believe more and herself and handling stressful times and making good decisions. Encouraged patient and practicing more positive behaviors as noted in  session including: Believing in herself more that she can continue to make significant changes to move forward in a positive direction, spending time with her dog which is very therapeutic for her, getting outside daily and walking, reflecting more on her progress,  staying in the present focusing on what she can control or change, healthy nutrition and exercise, refrain from jumping ahead and assuming worst case scenarios, reaching out to people in her support network, work letting go of negative thoughts and assumptions that hold her back, continue working with adjusting more easily when things do not go as planned or are out of her control, stop self negating, intentionally look for more positives within herself, practice forgiveness of others and herself, use of more positive self talk, and realize the strength that she shows when working with goal directed behaviors to move in a direction that supports her improved emotional health.  Goal review and progress/challenges noted with patient.  Next appointment within 3 to 4 weeks.  This record has been created using [employer].  Chart creation errors have been sought, but may not always have been located and corrected.  Such creation errors do not reflect on the standard of medical care provided.   Mathis Fare, LCSW

## 2022-05-14 ENCOUNTER — Ambulatory Visit (INDEPENDENT_AMBULATORY_CARE_PROVIDER_SITE_OTHER): Payer: BC Managed Care – PPO | Admitting: Psychiatry

## 2022-05-14 DIAGNOSIS — F411 Generalized anxiety disorder: Secondary | ICD-10-CM

## 2022-05-14 NOTE — Progress Notes (Signed)
[facility] Counselor/Therapist Progress Note  Patient ID: Mariah Young, MRN: [id number],    Date: 05/14/2022  Time Spent: 55 minutes   Treatment Type: Individual Therapy  Reported Symptoms: anxiety, depression (some better)   Mental Status Exam:  Appearance:   Casual     Behavior:  Appropriate, Sharing, and Motivated  Motor:  Normal  Speech/Language:   Clear and Coherent  Affect:  anxious  Mood:  anxious  Thought process:  goal directed  Thought content:    WNL  Sensory/Perceptual disturbances:    WNL  Orientation:  oriented to person, place, time/date, situation, day of week, month of year, year, and stated date of Oct. 24, 2023  Attention:  Good  Concentration:  Good  Memory:  WNL  Fund of knowledge:   Good  Insight:    Good  Judgment:   Good  Impulse Control:  Good   Risk Assessment: Danger to Self:  No Self-injurious Behavior: No Danger to Others: No Duty to Warn:no Physical Aggression / Violence:No  Access to Firearms a concern: No  Gang Involvement:No   Subjective: Patient today reporting anxiety as main symptom. Depression has decreased. Anxiety mostly related to work and to relationship and being apart due to jobs. 2nd anniversary of relationship with BF coming up next week. Work "going a little better than it was." Processed more of her work anxiety and how things can be overwhelming, and looking at strategies to help better manage the anxiety so as to stay more focused on her work. Does show more belief in herself and less self-doubt. Less tearfulness. Not miserable at work. Doesn't dread going into work. Sometimes gets overwhelmed due to volume of work but not that I don't like my job. Doing better financially at current job and likes current job better. Work/life balance is challenging. Some ongoing difficulty balancing work, family, relationship, and sleep. Discussed improved self-care and self-talk, setting more realistic expectations and limits with  work as she is able, trying to allow for better sleep schedule, and working around schedules to have quality time with her BF who lives out of town. Patient making very noticeable progress in being able to manage stressors as they occur without letting them overwhelm her. Really showing increased personal strength.  Interventions: Cognitive Behavioral Therapy and Ego-Supportive   Long term goal:  1. Stabilize anxiety level while increasing ability to function on a daily basis.  2. Patient will eventually progress to where she rates her anxiety as a "3" or less on "1-10 Anxiety scale" for at least 2 months. Short term goal: Increase understanding of beliefs and messages that produce anxiety, worry,fear, and negativity.  Strategies: Identify, challenge, and replace anxious/fearful/negative with positive, hopeful, and empoweriing self-talk.  Diagnosis:   ICD-10-CM   1. Generalized anxiety disorder  F41.1      Plan: Patient today showing good motivation and active participation in session working further on her anxiety, some depression, and especially worklife balance that is causing her stress and can elevate her anxiety at times.  Showing significant personal strength and growth especially in her ability to manage stressors and unexpected things more effectively as they occur. Is to follow through on goal-directed behaviors and strategies discussed in the areas as noted above, as patient continues to grow and demonstrate more resilient responses to stressors and challenges in her life personally and in her work life.  Is also happier and it shows in her affect and as she talks about some of the  ways that she sees she has gone and also some of the ways that she is still working on further growth.  Does recognize that work/life balance needs to be a real priority for her right now as she feels if she could be stronger and setting certain limits and boundaries her stress would not be as high in that  area.  Encouraged patient in her practice of more positive behaviors as discussed in sessions including: Believing more in herself that she can continue to make significant changes to move forward in a positive direction, spending time with her dog which is very therapeutic for her, getting outside daily and walking, reflecting more on her progress, staying in the present focusing what she can control her change, healthy nutrition and exercise, refrain from jumping ahead and assuming worst-case scenarios, reaching out to people and her support network, work to let go of negative thoughts and assumptions that hold her back, continue working on adjusting more easily when things do not go as planned or are out of her control, refrain from self negating, intentionally look for more positives within herself, practice forgiveness of self and others, use of more positive self talk, and recognize the strength that she shows when working with goal-directed behaviors to move in a direction that supports her improved emotional health and overall wellbeing.  Goal review and progress/challenges noted with patient.  Next appointment within 3 weeks.  This record has been created using [employer].  Chart creation errors have been sought, but may not always have been located and corrected.  Such creation errors do not reflect on the standard of medical care provided.   Shanon Ace, LCSW

## 2022-05-16 ENCOUNTER — Encounter (HOSPITAL_BASED_OUTPATIENT_CLINIC_OR_DEPARTMENT_OTHER): Payer: 59 | Admitting: Nurse Practitioner

## 2022-06-04 ENCOUNTER — Ambulatory Visit (INDEPENDENT_AMBULATORY_CARE_PROVIDER_SITE_OTHER): Payer: BC Managed Care – PPO | Admitting: Psychiatry

## 2022-06-04 DIAGNOSIS — F411 Generalized anxiety disorder: Secondary | ICD-10-CM | POA: Diagnosis not present

## 2022-06-04 NOTE — Progress Notes (Signed)
[facility] Counselor/Therapist Progress Note  Patient ID: Mariah Young, MRN: [id number],    Date: 06/04/2022  Time Spent: 57 minutes   Treatment Type: Individual Therapy  Reported Symptoms: anxiety, some depression  Mental Status Exam:  Appearance:   Casual     Behavior:  Appropriate, Sharing, and Motivated  Motor:  Normal  Speech/Language:   Clear and Coherent  Affect:  anxious  Mood:  anxious  Thought process:  goal directed  Thought content:    Obsessions  Sensory/Perceptual disturbances:    WNL  Orientation:  oriented to person, place, time/date, situation, day of week, month of year, year, and stated date of Nov. 14, 2024  Attention:  Good  Concentration:  Good  Memory:  WNL  Fund of knowledge:   Good  Insight:    Good  Judgment:   Good  Impulse Control:  Good   Risk Assessment: Danger to Self:  No Self-injurious Behavior: No Danger to Others: No Duty to Warn:no Physical Aggression / Violence:No  Access to Firearms a concern: No  Gang Involvement:No   Subjective: Patient today reporting anxiety, with depression decreasing. Feel "like I'm doing better in some ways and still struggling in other ways." Have boundaries which are better at work. Anxiety mostly related to relationship and  being separated from each other, work, and family. Shared and processed current stressors with work and relationship issues especially; some issues of indecision that was helpful for her to share and get feedback as well as look at options for ways of handling certain stressors, and also the value of being able to not respond or have more patience and delay her response. Less tearfulness, believing more in herself, "haven't felt hopeless in a long time", t"rying to balance life better and give myself my work but not 110% as I'm realizing I need time and energy for other things as well." More resilient. Does like her job but it can be overwhelming at times but not often, and  venting to someone helps. Doing better financially.   Interventions: Cognitive Behavioral Therapy and Ego-Supportive  Long term goal:  1. Stabilize anxiety level while increasing ability to function on a daily basis.  2. Patient will eventually progress to where she rates her anxiety as a "3" or less on "1-10 Anxiety scale" for at least 2 months. Short term goal: Increase understanding of beliefs and messages that produce anxiety, worry,fear, and negativity.  Strategies: Identify, challenge, and replace anxious/fearful/negative with positive, hopeful, and empoweriing self-talk.  Diagnosis:   ICD-10-CM   1. Generalized anxiety disorder  F41.1      Plan:  Patient trying focusing more on improved work/life balance which is a challenge.  To pay more attention to ongoing improvement in self-esteem,allowing for better sleep schedule, positive self talk, and setting realistic goals and boundaries.  BF remains very supportive although is still living out of town with his new job.  Patient definitely managing stressors better and able to modulate her emotional responses sometimes which is an improvement for her.  Really showing increased personal strength and perseverance towards her goals.  Making progress and needs to continue in therapy to work with goal-directed behaviors to further her progress on her goals and feeling more grounded within herself and making more sound decisions. Encouraged patient in her practice of more positive behaviors as discussed in session including: Believing in herself more that she can continue to make significant changes to move forward, spending time with her dog which  is very therapeutic for her, getting outside daily and walking, reflecting more on her progress, staying in the present focusing on what she can control or change, healthy nutrition and exercise, refrain from jumping ahead and assuming worst-case scenarios, reaching out to people and her support network, work  to let go of negative thoughts and assumptions, continue working on adjusting more easily when things do not go as planned or are out of her control, refrain from self negating, intentionally look for more positives within herself and others, practice forgiveness of self and others, positive self talk, and realize the strength that she shows when she works with goal-directed behaviors to move in a direction that supports her improved emotional health.  Goal review and progress/challenges noted with patient.  Next appointment within 3 to 4 weeks.  This record has been created using [employer].  Chart creation errors have been sought, but may not always have been located and corrected.  Such creation errors do not reflect on the standard of medical care provided.   Mathis Fare, LCSW

## 2022-06-25 ENCOUNTER — Encounter: Payer: Self-pay | Admitting: Adult Health

## 2022-06-25 ENCOUNTER — Ambulatory Visit (INDEPENDENT_AMBULATORY_CARE_PROVIDER_SITE_OTHER): Payer: BC Managed Care – PPO | Admitting: Psychiatry

## 2022-06-25 ENCOUNTER — Ambulatory Visit (INDEPENDENT_AMBULATORY_CARE_PROVIDER_SITE_OTHER): Payer: BC Managed Care – PPO | Admitting: Adult Health

## 2022-06-25 DIAGNOSIS — F422 Mixed obsessional thoughts and acts: Secondary | ICD-10-CM | POA: Diagnosis not present

## 2022-06-25 DIAGNOSIS — F411 Generalized anxiety disorder: Secondary | ICD-10-CM

## 2022-06-25 DIAGNOSIS — F410 Panic disorder [episodic paroxysmal anxiety] without agoraphobia: Secondary | ICD-10-CM | POA: Diagnosis not present

## 2022-06-25 DIAGNOSIS — F902 Attention-deficit hyperactivity disorder, combined type: Secondary | ICD-10-CM

## 2022-06-25 MED ORDER — AMPHETAMINE-DEXTROAMPHET ER 25 MG PO CP24: 50.0000 mg | ORAL_CAPSULE | Freq: Every day | ORAL | 0 refills | Status: DC

## 2022-06-25 MED ORDER — DESVENLAFAXINE SUCCINATE ER 100 MG PO TB24: 100.0000 mg | ORAL_TABLET | Freq: Every day | ORAL | 1 refills | Status: DC

## 2022-06-25 NOTE — Progress Notes (Signed)
[facility] Counselor/Therapist Progress Note  Patient ID: Mariah Young, MRN: [id number],    Date: 06/25/2022  Time Spent: 55 minutes   Treatment Type: Individual Therapy  Reported Symptoms: anxiety  Mental Status Exam:  Appearance:   Casual     Behavior:  Appropriate, Sharing, and Motivated  Motor:  Normal  Speech/Language:   Clear and Coherent  Affect:  anxious  Mood:  anxious  Thought process:  goal directed  Thought content:    Obsessive thoughts (but decreased some)  Sensory/Perceptual disturbances:    WNL  Orientation:  oriented to person, place, time/date, situation, day of week, month of year, year, and stated date of Dec. 5, 2023  Attention:  Good  Concentration:  Good  Memory:  WNL  Fund of knowledge:   Good  Insight:    Good  Judgment:   Good  Impulse Control:  Good   Risk Assessment: Danger to Self:  No Self-injurious Behavior: No Danger to Others: No Duty to Warn:no Physical Aggression / Violence:No  Access to Firearms a concern: No  Gang Involvement:No   Subjective: Patient today reporting "I'm still trying to find a balance between work and my homelife." Tiring work but trying to maintain my personal relationships. Things good with BF in [place] but with both working and traveling and taking care of home, she reports they are often tired. Broke down crying recently because "I feel like we're always stressed driving back and forth, taking care of 2 households, and work is very busy for both of [place] and requiring some travel as well. Worked today on better managing stress and anxiety more effectively, not making negative assumptions, and having better work/life balance. Would like to move closer to BF of over 2 yrs but not possible right now with company she works with. Trying to pace herself, encourage herself and hope a position will open in [place]. To continue focusing more on her self-care, healthy habits in her relationship, and  having healthy  boundaries, and manage stress in healthier ways as discussed today.   Interventions: Cognitive Behavioral Therapy, Solution-Oriented/Positive Psychology, and Ego-Supportive  Long term goal:  1. Stabilize anxiety level while increasing ability to function on a daily basis.  2. Patient will eventually progress to where she rates her anxiety as a "3" or less on "1-10 Anxiety scale" for at least 2 months. Short term goal: Increase understanding of beliefs and messages that produce anxiety, worry,fear, and negativity.  Strategies: Identify, challenge, and replace anxious/fearful/negative with positive, hopeful, and empoweriing self-talk.  Diagnosis:   ICD-10-CM   1. Generalized anxiety disorder  F41.1      Plan: Patient today working on limit setting, better self care, and setting healthier boundaries with others, talking through stresses in her life and looking at different ways of managing the stress and being able to feel more self-assured and  following through more consistently on goal-directed behaviors.  Work/life balance is important for her. Making progress and feeling stronger and more grounded. Encouraged patient in practicing more positive behaviors as noted in session including: Believe more in herself that she can continue to make significant changes to move forward, spending time with her dog which is very therapeutic for her, getting outside daily and walking, reflecting more on her progress made, staying in the present focusing on what she can control her change, healthy nutrition and exercise, refrain from jumping ahead and assuming worst-case scenarios, reaching out to people in her support network, work to let  go of negative thoughts and assumptions, continue working on adjusting more easily when things do not go as planned or are out of her control, refrain from self negating, intentionally look for more positives within herself and others, practice forgiveness of self and others,  positive self talk, and recognize the strength she shows when working with goal-directed behaviors to move in a direction that supports her improved emotional health and overall wellbeing.  Goal review and progress/challenges noted with patient.  Next appointment within 3 weeks.  This record has been created using [employer].  Chart creation errors have been sought, but may not always have been located and corrected.  Such creation errors do not reflect on the standard of medical care provided.   Mathis Fare, LCSW

## 2022-06-25 NOTE — Progress Notes (Signed)
Mariah Young [id number] [birth year] 26 y.o.  Subjective:   Patient ID:  Mariah Young is a 26 y.o. (DOB 06-02-1996) female.  Chief Complaint: No chief complaint on file.   HPI Mariah Young presents to the office today for follow-up of ADHD, panic disorder, GAD, and obsessional thoughts.   Describes mood today as "ok". Pleasant. Decreased tearfulness. Mood symptoms - reports decreased depression and anxiety - things are manageable. Denies irritability. Denies panic attacks. Mood is consistent. Stating "things are going pretty good". Feels like medications are working well. She and partner doing well. Improved interest and motivation. Working with therapist - Rockne Menghini. Taking medications as prescribed. Energy levels stable. Active, has been exercising over the past 2 weeks - disc golf.  Enjoys some usual interests and activities. In a relationship. Lives alone with dog - "[name]". Family in [place]. Appetite adequate. Weight stable. Sleep is improving. Averages 5 to 6 hours. Focus and concentration stable. Completing tasks. Managing aspects of household. Working full time - 55 hours. Denies SI or HI.  Denies AH or VH. Denies self harm. Denies substance use.  Previous medication trials: Prozac   GAD-7    Flowsheet Row Office Visit from 11/14/2020 in [facility]  Total GAD-7 Score 11      PHQ2-9    Flowsheet Row Office Visit from 11/14/2020 in [facility]  PHQ-2 Total Score 3  PHQ-9 Total Score 14      Flowsheet Row ED from 04/22/2022 in [facility] ED from 03/08/2021 in [facility]  ED from 02/22/2021 in [facility] at [facility]  C-SSRS RISK CATEGORY No Risk No Risk No Risk        Review of Systems:  Review of Systems  Musculoskeletal:  Negative for gait problem.  Neurological:  Negative for  tremors.  Psychiatric/Behavioral:         Please refer to HPI    Medications: I have reviewed the patient's current medications.  Current Outpatient Medications  Medication Sig Dispense Refill   ALPRAZolam (XANAX) 1 MG tablet Take 1 tablet (1 mg total) by mouth 2 (two) times daily as needed for anxiety. 180 tablet 0   amphetamine-dextroamphetamine (ADDERALL XR) 25 MG 24 hr capsule Take 2 capsules by mouth daily after breakfast. 180 capsule 0   desvenlafaxine (PRISTIQ) 100 MG 24 hr tablet Take 1 tablet (100 mg total) by mouth daily. 90 tablet 1   Multiple Vitamin (ONE-DAILY MULTI-VITAMIN) TABS Take 1 tablet by mouth daily.     No current facility-administered medications for this visit.    Medication Side Effects: None  Allergies:  Allergies  Allergen Reactions   Clarithromycin Other (See Comments) and Rash    Vasculitis Vasculitis  Other reaction(s): Unknown   Penicillins Other (See Comments)    Other reaction(s): Intolerance vasculitis vasculitis    Penicillin G     Other reaction(s): Unknown    Past Medical History:  Diagnosis Date   Acute non-recurrent maxillary sinusitis 07/12/2017   ADHD (attention deficit hyperactivity disorder)    Anxiety    Cough 07/12/2017   Diaphoresis    Dysmenorrhea    IUD (intrauterine device) in place    Obesity (BMI 30.0-34.9)    Obsessive compulsive disorder     Past Medical History, Surgical history, Social history, and Family history were reviewed and updated as appropriate.   Please see review of systems for further details on  the patient's review from today.   Objective:   Physical Exam:  There were no vitals taken for this visit.  Physical Exam Constitutional:      General: She is not in acute distress. Musculoskeletal:        General: No deformity.  Neurological:     Mental Status: She is alert and oriented to person, place, and time.     Coordination: Coordination normal.  Psychiatric:        Attention and  Perception: Attention and perception normal. She does not perceive auditory or visual hallucinations.        Mood and Affect: Mood normal. Mood is not anxious or depressed. Affect is not labile, blunt, angry or inappropriate.        Speech: Speech normal.        Behavior: Behavior normal.        Thought Content: Thought content normal. Thought content is not paranoid or delusional. Thought content does not include homicidal or suicidal ideation. Thought content does not include homicidal or suicidal plan.        Cognition and Memory: Cognition and memory normal.        Judgment: Judgment normal.     Comments: Insight intact     Lab Review:     Component Value Date/Time   NA 140 05/14/2021 1049   K 4.6 05/14/2021 1049   CL 101 05/14/2021 1049   CO2 22 05/14/2021 1049   GLUCOSE 90 05/14/2021 1049   GLUCOSE 95 11/14/2020 1007   BUN 12 05/14/2021 1049   CREATININE 0.70 05/14/2021 1049   CALCIUM 10.0 05/14/2021 1049   PROT 7.7 05/14/2021 1049   ALBUMIN 5.1 (H) 05/14/2021 1049   AST 17 05/14/2021 1049   ALT 13 05/14/2021 1049   ALKPHOS 76 05/14/2021 1049   BILITOT 0.2 05/14/2021 1049   GFRNONAA >60 11/14/2020 1007       Component Value Date/Time   WBC 7.5 05/14/2021 1049   WBC 10.5 11/02/2020 0828   RBC 4.85 05/14/2021 1049   RBC 4.28 11/02/2020 0828   HGB 15.1 05/14/2021 1049   HCT 44.1 05/14/2021 1049   PLT 256 11/02/2020 0828   MCV 91 05/14/2021 1049   MCH 31.1 05/14/2021 1049   MCH 30.4 11/02/2020 0828   MCHC 34.2 05/14/2021 1049   MCHC 34.1 11/02/2020 0828   RDW 12.6 05/14/2021 1049   LYMPHSABS 2.3 05/14/2021 1049   MONOABS 0.6 11/02/2020 0828   EOSABS 0.0 05/14/2021 1049   BASOSABS 0.0 05/14/2021 1049    No results found for: "POCLITH", "LITHIUM"   No results found for: "PHENYTOIN", "PHENOBARB", "VALPROATE", "CBMZ"   .res Assessment: Plan:    Plan:  PDMP reviewed  Continue:  Adderall XR 25mg  capsule daily Adderall 20mg  daily - none needed  today Xanax 1mg  BID - none needed today Pristiq 100mg  every morning  126/68/84 - will monitor between visits  RTC 3 months  Patient advised to contact office with any questions, adverse effects, or acute worsening in signs and symptoms.  Discussed potential benefits, risk, and side effects of benzodiazepines to include potential risk of tolerance and dependence, as well as possible drowsiness.  Advised patient not to drive if experiencing drowsiness and to take lowest possible effective dose to minimize risk of dependence and tolerance.  Discussed potential benefits, risks, and side effects of stimulants with patient to include increased heart rate, palpitations, insomnia, increased anxiety, increased irritability, or decreased appetite.  Instructed patient to contact office if experiencing  any significant tolerability issues.  Diagnoses and all orders for this visit:  Attention deficit hyperactivity disorder (ADHD), combined type, moderate -     amphetamine-dextroamphetamine (ADDERALL XR) 25 MG 24 hr capsule; Take 2 capsules by mouth daily after breakfast.  Generalized anxiety disorder -     desvenlafaxine (PRISTIQ) 100 MG 24 hr tablet; Take 1 tablet (100 mg total) by mouth daily.  Mixed obsessional thoughts and acts -     desvenlafaxine (PRISTIQ) 100 MG 24 hr tablet; Take 1 tablet (100 mg total) by mouth daily.  Panic disorder     Please see After Visit Summary for patient specific instructions.  Future Appointments  Date Time Provider Department Center  06/25/2022  5:00 PM Mathis Fare, LCSW [facility] None  07/18/2022  5:00 PM Mathis Fare, LCSW [facility] None  08/07/2022  5:00 PM Mathis Fare, LCSW [facility] None  08/28/2022  5:00 PM Mathis Fare, LCSW [facility] None  09/18/2022  5:00 PM Mathis Fare, LCSW [facility] None  10/09/2022  5:00 PM Mathis Fare, LCSW [facility] None  10/10/2022  5:00 PM Zhaire Locker, Thereasa Solo, NP [facility] None  10/30/2022  5:00 PM Mathis Fare, LCSW [facility] None    No  orders of the defined types were placed in this encounter.   -------------------------------

## 2022-07-18 ENCOUNTER — Ambulatory Visit (INDEPENDENT_AMBULATORY_CARE_PROVIDER_SITE_OTHER): Payer: BC Managed Care – PPO | Admitting: Psychiatry

## 2022-07-18 DIAGNOSIS — F411 Generalized anxiety disorder: Secondary | ICD-10-CM | POA: Diagnosis not present

## 2022-07-18 NOTE — Progress Notes (Signed)
[facility] Counselor/Therapist Progress Note  Patient ID: Mariah Young, MRN: [id number],    Date: 07/18/2022  Time Spent:  52 minutes  Treatment Type: Individual Therapy  Reported Symptoms: anxiety, some depression "improving some but good days and bad days", tearfulness re: work and relationship issues  Mental Status Exam:  Appearance:   Neat     Behavior:  Appropriate, Sharing, and Motivated  Motor:  Normal  Speech/Language:   Clear and Coherent  Affect:  anxious  Mood:  anxious and some depression  Thought process:  goal directed  Thought content:    WNL  Sensory/Perceptual disturbances:    WNL  Orientation:  oriented to person, place, time/date, situation, day of week, month of year, year, and stated date of Dec. 28, 2023  Attention:  Good  Concentration:  Good  Memory:  WNL  Fund of knowledge:   Good  Insight:    Good  Judgment:   Good  Impulse Control:  Good and Fair   Risk Assessment: Danger to Self:  No Self-injurious Behavior: No Danger to Others: No Duty to Warn:no Physical Aggression / Violence:No  Access to Firearms a concern: No  Gang Involvement:No   Subjective:  Patient in today reporting anxiety heightened some recently but depression is some lower. Work stressful and still longing to move to [place] to be closer to BF. Relationship with BF strengthening and seems healthy for patient. Some inter-family issues beginning that are of concern to patient but trying not to over-stress prematurely. Talked through some of the family issues today which seemed to help some and give her better clarity. Focused more on not assuming in relationships, and better communicating of needs. Also worked further on work/life balance and how it impacts  her relationship with BF which actually seems to be a good relationship for patient. Is difficult living a couple hours away from BF, and they work together to make their relationship work as well as possible. 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 Sad that she doesn't get to see BF more often and talked through that sadness today. Continues working to better manage stress and disappointments versus letting them manage her. Encouraged healthy boundaries, healthy self care and self talk.  Interventions: Cognitive Behavioral Therapy and Ego-Supportive  Long term goal:  1. Stabilize anxiety level while increasing ability to function on a daily basis.  2. Patient will eventually progress to where she rates her anxiety as a "3" or less on "1-10 Anxiety scale" for at least 2 months. Short term goal: Increase understanding of beliefs and messages that produce anxiety, worry,fear, and negativity.  Strategies: Identify, challenge, and replace anxious/fearful/negative with positive, hopeful, and empoweriing self-talk.  Diagnosis:   ICD-10-CM   1. Generalized anxiety disorder  F41.1      Plan:  Patient today showing good motivation and active participation in session focusing on her anxiety which has increased some recently and depression which is decreased by patient having some sadness and openly expressing that regarding work situations that impact her relationship with BF.  Did well in talking through this today and looking at how they can make the best of the time they do have living apart right now.  Her job is such that they cannot move her for another 11 months unless someone were to leave a position.  As noted above, this makes it difficult on her relationship with BF as  they do plan to be married eventually and enjoy life together going forward.  Currently they are trying to make "the best of the situation" and have some quality time together even though they are living a couple hours apart and both working hectic schedules.  Working more with goal-directed behaviors and particularly pain attention to what seems to make her anxiety and worry  stronger and what seems to help her in coping more effectively.  Discussed this some at the end of session and she is going to give it more thought between sessions. Encouraged patient in her practice of more positive behaviors as noted in session including: Believing in herself more that she can make significant changes to move forward, spending time with her dog which is very therapeutic for her, getting outside daily and walking, staying in the present focusing on what she can control or change, healthy nutrition and exercise, refrain from jumping ahead and assuming worst-case scenarios, reaching out to people and her support network, working to let go of negative thoughts and assumptions, continue working on adjusting more easily when things do not go as planned or are out of her control, refrain from self negating, intentionally look for more positives within herself and others, practice forgiveness of self and others, positive self talk, and realize the strength she shows when working with goal-directed behaviors to move in a direction that supports her improved emotional health and outlook.  Goal review and progress/challenges noted with patient.  Next appointment within approximately 3 weeks.  This record has been created using [employer].  Chart creation errors have been sought, but may not always have been located and corrected.  Such creation errors do not reflect on the standard of medical care provided.   Shanon Ace, LCSW

## 2022-08-07 ENCOUNTER — Ambulatory Visit (INDEPENDENT_AMBULATORY_CARE_PROVIDER_SITE_OTHER): Payer: BC Managed Care – PPO | Admitting: Psychiatry

## 2022-08-07 DIAGNOSIS — F411 Generalized anxiety disorder: Secondary | ICD-10-CM

## 2022-08-07 NOTE — Progress Notes (Deleted)
[facility] Counselor/Therapist Progress Note  Patient ID: Mariah Young, MRN: [id number],    Date: 08/07/2022  Time Spent: ***   Treatment Type: {CHL AMB THERAPY TYPES:539 167 2058}  Reported Symptoms: ***  Mental Status Exam:  Appearance:   {PSY:22683}     Behavior:  {PSY:21022743}  Motor:  {PSY:22302}  Speech/Language:   {PSY:22685}  Affect:  {PSY:22687}  Mood:  {PSY:31886}  Thought process:  {PSY:31888}  Thought content:    {PSY:430-124-0042}  Sensory/Perceptual disturbances:    {PSY:508 016 2488}  Orientation:  {PSY:30297}  Attention:  {PSY:22877}  Concentration:  {PSY:([phone] 568-3424}  Memory:  {PSY:801-685-5138}  Fund of knowledge:   {PSY:([phone] 568-3424}  Insight:    {PSY:(929) 568-3424}  Judgment:   {PSY:([phone] 568-3424}  Impulse Control:  {PSY:([phone] 568-3424}   Risk Assessment: Danger to Self:  {PSY:22692} Self-injurious Behavior: {PSY:22692} Danger to Others: {PSY:22692} Duty to Warn:{PSY:311194} Physical Aggression / Violence:{PSY:21197} Access to Firearms a concern: {PSY:21197} Gang Involvement:{PSY:21197}  Subjective: ***     Patient in today reporting anxiety heightened some recently but depression is some lower. Work stressful and still longing to move to [place] to be closer to BF. Relationship with BF strengthening and seems healthy for patient. Some inter-family issues beginning that are of concern to patient but trying not to over-stress prematurely. Talked through some of the family issues today which seemed to help some and give her better clarity. Focused more on not assuming in relationships, and better communicating of needs. Also worked further on work/life balance and how it impacts  her relationship with BF which actually seems to be a good relationship for patient. Is difficult living a couple hours away from BF, and they work together to make their relationship work as well as possible. 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 Sad that she doesn't get to see BF more often and talked through that sadness today. Continues working to better manage stress and disappointments versus letting them manage her. Encouraged healthy boundaries, healthy self care and self talk.     Interventions: {PSY:[id number]}  Long term goal:  1. Stabilize anxiety level while increasing ability to function on a daily basis.  2. Patient will eventually progress to where she rates her anxiety as a "3" or less on "1-10 Anxiety scale" for at least 2 months. Short term goal: Increase understanding of beliefs and messages that produce anxiety, worry,fear, and negativity.  Strategies: Identify, challenge, and replace anxious/fearful/negative with positive, hopeful, and empoweriing self-talk.  Diagnosis:No diagnosis found.  Plan: ***   Patient today showing good motivation and active participation in session focusing on her anxiety which has increased some recently and depression which is decreased by patient having some sadness and openly expressing that regarding work situations that impact her relationship with BF. Did well in talking through this today and looking at how they can make the best of the time they do have living apart right now. Her job is such that they cannot move her for another 11 months unless someone were to leave a position. As noted above, this makes it difficult on her relationship with BF as they do plan to be married eventually and enjoy life together going forward. Currently they are trying to make "the best of the situation" and have some quality time together even though they are living a couple hours apart and both working hectic schedules. Working more with goal-directed behaviors and particularly pain attention to what seems to make her  anxiety and worry stronger and what seems to help her in coping more effectively. Discussed this some at the end of session and she is going to give  it more thought between sessions.   /////////////////////////////////////////////////////////////////////////////////  Encouraged patient in practicing more positive behaviors as noted in session: Believe more in herself that she can continue to make significant changes and moving forward, spending time with her dog which is very therapeutic for her, getting outside daily and walking, staying in the present focusing what she can control or change, healthy nutrition and exercise, refrain from jumping ahead and assuming worst-case scenarios, practicing more patience, continue working on adjusting more easily when things do not go as planned or are out of her control, refrain from self negating, intentionally look for more positives within herself and others, practice forgiveness of self and others, positive self talk, and recognize the strengths she shows when working with goal-directed behaviors to move in a direction that supports her improved emotional health and overall wellbeing.  Goal review and progress/challenges noted with patient.  Next appointment within 3 weeks.  This record has been created using [employer].  Chart creation errors have been sought, but may not always have been located and corrected.  Such creation errors do not reflect on the standard of medical care provided.   Shanon Ace, LCSW

## 2022-08-07 NOTE — Progress Notes (Deleted)
[facility] Counselor/Therapist Progress Note  Patient ID: Mariah Young, MRN: [id number],    Date: 08/07/2022  Time Spent: ***   Treatment Type: {CHL AMB THERAPY TYPES:539 167 2058}  Reported Symptoms: ***  Mental Status Exam:  Appearance:   {PSY:22683}     Behavior:  {PSY:21022743}  Motor:  {PSY:22302}  Speech/Language:   {PSY:22685}  Affect:  {PSY:22687}  Mood:  {PSY:31886}  Thought process:  {PSY:31888}  Thought content:    {PSY:430-124-0042}  Sensory/Perceptual disturbances:    {PSY:508 016 2488}  Orientation:  {PSY:30297}  Attention:  {PSY:22877}  Concentration:  {PSY:(929) 568-3424}  Memory:  {PSY:801-685-5138}  Fund of knowledge:   {PSY:([phone] 568-3424}  Insight:    {PSY:([phone] 568-3424}  Judgment:   {PSY:([phone] 568-3424}  Impulse Control:  {PSY:([phone] 568-3424}   Risk Assessment: Danger to Self:  {PSY:22692} Self-injurious Behavior: {PSY:22692} Danger to Others: {PSY:22692} Duty to Warn:{PSY:311194} Physical Aggression / Violence:{PSY:21197} Access to Firearms a concern: {PSY:21197} Gang Involvement:{PSY:21197}  Subjective: ***     Patient in today reporting anxiety heightened some recently but depression is some lower. Work stressful and still longing to move to [place] to be closer to BF. Relationship with BF strengthening and seems healthy for patient. Some inter-family issues beginning that are of concern to patient but trying not to over-stress prematurely. Talked through some of the family issues today which seemed to help some and give her better clarity. Focused more on not assuming in relationships, and better communicating of needs. Also worked further on work/life balance and how it impacts  her relationship with BF which actually seems to be a good relationship for patient. Is difficult living a couple hours away from BF, and they work together to make their relationship work as well as possible. 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 Sad that she doesn't get to see BF more often and talked through that sadness today. Continues working to better manage stress and disappointments versus letting them manage her. Encouraged healthy boundaries, healthy self care and self talk.     Interventions: {PSY:[id number]}  Long term goal:  1. Stabilize anxiety level while increasing ability to function on a daily basis.  2. Patient will eventually progress to where she rates her anxiety as a "3" or less on "1-10 Anxiety scale" for at least 2 months. Short term goal: Increase understanding of beliefs and messages that produce anxiety, worry,fear, and negativity.  Strategies: Identify, challenge, and replace anxious/fearful/negative with positive, hopeful, and empoweriing self-talk.  Diagnosis:No diagnosis found.  Plan: ***   Patient today showing good motivation and active participation in session focusing on her anxiety which has increased some recently and depression which is decreased by patient having some sadness and openly expressing that regarding work situations that impact her relationship with BF. Did well in talking through this today and looking at how they can make the best of the time they do have living apart right now. Her job is such that they cannot move her for another 11 months unless someone were to leave a position. As noted above, this makes it difficult on her relationship with BF as they do plan to be married eventually and enjoy life together going forward. Currently they are trying to make "the best of the situation" and have some quality time together even though they are living a couple hours apart and both working hectic schedules. Working more with goal-directed behaviors and particularly pain attention to what seems to make her  anxiety and worry stronger and what seems to help her in coping more effectively. Discussed this some at the end of session and she is going to give  it more thought between sessions.   /////////////////////////////////////////////////////////////////////////////////  Encouraged patient in practicing more positive behaviors as noted in session: Believe more in herself that she can continue to make significant changes and moving forward, spending time with her dog which is very therapeutic for her, getting outside daily and walking, staying in the present focusing what she can control or change, healthy nutrition and exercise, refrain from jumping ahead and assuming worst-case scenarios, practicing more patience, continue working on adjusting more easily when things do not go as planned or are out of her control, refrain from self negating, intentionally look for more positives within herself and others, practice forgiveness of self and others, positive self talk, and recognize the strengths she shows when working with goal-directed behaviors to move in a direction that supports her improved emotional health and overall wellbeing.  Goal review and progress/challenges noted with patient.  Next appointment within 3 weeks.  This record has been created using [employer] software.  Chart creation errors have been sought, but may not always have been located and corrected.  Such creation errors do not reflect on the standard of medical care provided.   Obediah Welles, LCSW                   

## 2022-08-07 NOTE — Progress Notes (Signed)
[facility] Counselor/Therapist Progress Note  Patient ID: Mariah Young, MRN: [id number],    Date: 08/07/2022  Time Spent: 55 minutes   Treatment Type: Individual Therapy  Reported Symptoms:  anxiety, tearful, some depression  Mental Status Exam:  Appearance:   Casual and Neat     Behavior:  Appropriate, Sharing, and Motivated  Motor:  Normal  Speech/Language:   Clear and Coherent  Affect:  Depressed and anxious, tearful  Mood:  anxious, depressed, and sad  Thought process:  goal directed  Thought content:    WNL  Sensory/Perceptual disturbances:    WNL  Orientation:  oriented to person, place, time/date, situation, day of week, month of year, year, and stated date of Jan. 17, 2024  Attention:  Good  Concentration:  Good  Memory:  WNL  Fund of knowledge:   Good  Insight:    Good and Fair  Judgment:   Good  Impulse Control:  Good   Risk Assessment: Danger to Self:  No Self-injurious Behavior: No Danger to Others: No Duty to Warn:no Physical Aggression / Violence:No  Access to Firearms a concern: No  Gang Involvement:No   Subjective: Patient in today reporting some depression, more anxiety and some increased tearfulness. Feels misssing her medication recently has contributed to the tearfulness and sadness. Also had her mid-year evaluation at work, and felt some discouragement on hoping to get to [place] area to work, not on her quality of work. Needed to talk through her work and relationship concerns in session today as she has stressed more and been more anxious most recently, and the talking she felt did help her feel more settled and grounding. Really struggling with some work situations that are out of her control, and we processed these in more detail with patient realizing what she can change and what she cannot change, and not interpreting changes in her thoughts/feelings/situations as necessarily being a failure or negative on her part. Tearfulness off and  on as she talked and did reach a point near session end of being more grounded and calm. Trying to not assume as much in relationships, and communicate her needs more directly. Work/life balance continues to be a challenge and is still working to improve this.   Interventions: Cognitive Behavioral Therapy and Ego-Supportive  Long term goal:  1. Stabilize anxiety level while increasing ability to function on a daily basis.  2. Patient will eventually progress to where she rates her anxiety as a "3" or less on "1-10 Anxiety scale" for at least 2 months. Short term goal: Increase understanding of beliefs and messages that produce anxiety, worry,fear, and negativity.  Strategies: Identify, challenge, and replace anxious/fearful/negative with positive, hopeful, and empoweriing self-talk.  Diagnosis:   ICD-10-CM   1. Generalized anxiety disorder  F41.1      Plan: Patient in session today showing active participation and good motivation as she worked further on her anxiety and some depression mostly related to personal, relationship, and job issues.  Worked really well on some tough issues that she is having to confront that are both personal and relationship oriented.  Trying to make thoughtful/healthy decisions.  Was very tearful through part of this and stated later that it was good to be able to shed those tears and talk through some of her fears and anxieties that relate to big decisions in her life, including some possible future change of direction vocationally.  Overall better stress management per her report, seems to be functioning well in  relationship with her BF, and having better boundaries and other relationships.  Confronted her anxiety and depression more head on today which was a positive thing for her, and helped her to realize that she does not need to avoid it nor try to "hide" from it and that she can work through it and figure out what decisions are best for her. Encouraged patient  in practicing more positive behaviors as noted in session: Believe more in herself that she can continue to make significant changes and moving forward, spending time with her dog which is very therapeutic for her, getting outside daily and walking, staying in the present focusing what she can control or change, healthy nutrition and exercise, refrain from jumping ahead and assuming worst-case scenarios, practicing more patience, continue working on adjusting more easily when things do not go as planned or are out of her control, refrain from self negating, intentionally look for more positives within herself and others, practice forgiveness of self and others, positive self talk, and recognize the strengths she shows when working with goal-directed behaviors to move in a direction that supports her improved emotional health and overall wellbeing.  Goal review and progress/challenges noted with patient.  Next appointment within 3 weeks.  This record has been created using [employer].  Chart creation errors have been sought, but may not always have been located and corrected.  Such creation errors do not reflect on the standard of medical care provided.   Shanon Ace, LCSW

## 2022-08-07 NOTE — Progress Notes (Deleted)
[facility] Counselor/Therapist Progress Note  Patient ID: Mariah Young, MRN: [id number],    Date: 08/07/2022  Time Spent: ***   Treatment Type: {CH[facility] AMB THERAPY TYPES:[id number]}  Reported Symptoms: ***  Mental Status Exam:  Appearance:   {PSY:22683}     Behavior:  {PSY:[id number]}  Motor:  {PSY:22302}  Speech/Language:   {PSY:22685}  Affect:  {PSY:22687}  Mood:  {PSY:31886}  Thought process:  {PSY:31888}  Thought content:    {PSY:[id number]}  Sensory/Perceptual disturbances:    {PSY:508 016 2488}  Orientation:  {PSY:30297}  Attention:  {PSY:22877}  Concentration:  {[phone]}  Memory:  {PSY:[phone]}  Fund of knowledge:   {PSY:([phone]}  Insight:    {PSY:([phone] [phone]}  Judgment:   {PSY:([phone] [phone]}  Impulse Control:  {PSY:([phone]}   Risk Assessment: Danger to Self:  {PSY:22692} Self-injurious Behavior: {PSY:22692} Danger to Others: {PSY:22692} Duty to Warn:{PSY:311194} Physical Aggression / Violence:{PSY:21197} Access to Firearms a concern: {PSY:21197} Gang Involvement:{PSY:21197}  Subjective: ***     Patient in today reporting anxiety heightened some recently but depression is some lower. Work stressful and still longing to move to [place] to be closer to BF. Relationship with BF strengthening and seems healthy for patient. Some inter-family issues beginning that are of concern to patient but trying not to over-stress prematurely. Talked through some of the family issues today which seemed to help some and give her better clarity. Focused more on not assuming in relationships, and better communicating of needs. Also worked further on work/life balance and how it impacts  her relationship with BF which actually seems to be a good relationship for patient. Is difficult living a couple hours away from BF, and they work together to make their relationship work as well as possible. 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 Sad that she doesn't get to see BF more often and talked through that sadness today. Continues working to better manage stress and disappointments versus letting them manage her. Encouraged healthy boundaries, healthy self care and self talk.     Interventions: {PSY:[id number]}  Long term goal:  1. Stabilize anxiety level while increasing ability to function on a daily basis.  2. Patient will eventually progress to where she rates her anxiety as a "3" or less on "1-10 Anxiety scale" for at least 2 months. Short term goal: Increase understanding of beliefs and messages that produce anxiety, worry,fear, and negativity.  Strategies: Identify, challenge, and replace anxious/fearful/negative with positive, hopeful, and empoweriing self-talk.  Diagnosis:No diagnosis found.  Plan: ***   Patient today showing good motivation and active participation in session focusing on her anxiety which has increased some recently and depression which is decreased by patient having some sadness and openly expressing that regarding work situations that impact her relationship with BF. Did well in talking through this today and looking at how they can make the best of the time they do have living apart right now. Her job is such that they cannot move her for another 11 months unless someone were to leave a position. As noted above, this makes it difficult on her relationship with BF as they do plan to be married eventually and enjoy life together going forward. Currently they are trying to make "the best of the situation" and have some quality time together even though they are living a couple hours apart and both working hectic schedules. Working more with goal-directed behaviors and particularly pain attention to what seems to make her  anxiety and worry stronger and what seems to help her in coping more effectively. Discussed this some at the end of session and she is going to give  it more thought between sessions.   /////////////////////////////////////////////////////////////////////////////////  Encouraged patient in practicing more positive behaviors as noted in session: Believe more in herself that she can continue to make significant changes and moving forward, spending time with her dog which is very therapeutic for her, getting outside daily and walking, staying in the present focusing what she can control or change, healthy nutrition and exercise, refrain from jumping ahead and assuming worst-case scenarios, practicing more patience, continue working on adjusting more easily when things do not go as planned or are out of her control, refrain from self negating, intentionally look for more positives within herself and others, practice forgiveness of self and others, positive self talk, and recognize the strengths she shows when working with goal-directed behaviors to move in a direction that supports her improved emotional health and overall wellbeing.  Goal review and progress/challenges noted with patient.  Next appointment within 3 weeks.  This record has been created using Dragon software.  Chart creation errors have been sought, but may not always have been located and corrected.  Such creation errors do not reflect on the standard of medical care provided.   Winter Trefz, LCSW                   

## 2022-08-28 ENCOUNTER — Ambulatory Visit (INDEPENDENT_AMBULATORY_CARE_PROVIDER_SITE_OTHER): Payer: BC Managed Care – PPO | Admitting: Psychiatry

## 2022-08-28 DIAGNOSIS — F411 Generalized anxiety disorder: Secondary | ICD-10-CM

## 2022-08-28 NOTE — Progress Notes (Signed)
[facility] Counselor/Therapist Progress Note  Patient ID: Mariah Young, MRN: [id number],    Date: 08/28/2022  Time Spent: 55 minutes   Treatment Type: Individual Therapy  Reported Symptoms: anxiety, depression ("some better")  Mental Status Exam:  Appearance:   Casual and Neat     Behavior:  Appropriate, Sharing, and Motivated  Motor:  Normal  Speech/Language:   Clear and Coherent  Affect:  Anxious, some depression  Mood:  anxious and some depression  Thought process:  goal directed  Thought content:    Some obsessive thougths, "but decreased"  Sensory/Perceptual disturbances:    WNL  Orientation:  oriented to person, place, time/date, situation, day of week, month of year, year, and stated date of Feb. 7, 2024  Attention:  Good  Concentration:  Good  Memory:  WNL  Fund of knowledge:   Good  Insight:    Good  Judgment:   Good  Impulse Control:  Good and Fair   Risk Assessment: Danger to Self:  No Self-injurious Behavior: No Danger to Others: No Duty to Warn:no Physical Aggression / Violence:No  Access to Firearms a concern: No  Gang Involvement:No   Subjective:  Patient in today reporting some workplace issues and talks through these today in session. Able to think through her situation and options very clearly. Showing more patience and not rushing to decisions. Relationship with BF going well but very difficult living apart and approx 2 hrs away. Tearfully processed this in session today, "needed to address all this and think through it", and "not make a poor decision too quickly."  Trying hard to "focus on some positives" that don't feed her anxiety nor depression.  Also working to not "assume worst-case scenarios" regarding work work situations that she is hoping will happen within the year.  Was more calm and grounded by the end of the session and felt it was helpful to be able to vent her concerns and emotions during her time today to help her more clearly see  some potential positives in her situation.  More awareness of what "is in my control and what is not".  Is communicating her needs more directly with BF and other friends.  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   Interventions: Cognitive Behavioral Therapy and Ego-Supportive  Long term goal:  1. Stabilize anxiety level while increasing ability to function on a daily basis.  2. Patient will eventually progress to where she rates her anxiety as a "3" or less on "1-10 Anxiety scale" for at least 2 months. Short term goal: Increase understanding of beliefs and messages that produce anxiety, worry,fear, and negativity.  Strategies: Identify, challenge, and replace anxious/fearful/negative with positive, hopeful, and empoweriing self-talk.  Diagnosis:   ICD-10-CM   1. Generalized anxiety disorder  F41.1      Plan:  Patient today actively participating and showing good motivation in session as she focused on her anxiety and depression as noted above.  Her anxiety has been heightened, but her depression has decreased some.  Remains very motivated in in sessions and remains goal-directed in between sessions as well.  Her level of patience can be inconsistent and unpredictable at times, but she is showing more insight as to what helps and what does not help and trying to change some of her ways of looking at  things and responding to situations that is healthier for her.  Confronting tough personal and interpersonal issues but worked hard and tries to make healthy/thoughtful decisions.  Improve boundaries and other relationships.  Continues to confront her anxiety more directly which results in her feeling things more directly but the outcomes are typically more helpful to her especially in terms of her increased insight in some situations, relationships, and in her  own behavior. Encouraged patient in her practice of positive and self affirming behaviors as discussed in session including: Believing more in herself that she can continue to make significant changes and move forward, spending time with her dog which is very therapeutic for her, getting outside daily and walking, staying in the present focusing on what she can control or change, healthy nutrition and exercise, refrain from jumping ahead and assuming worst-case scenarios, practicing more patience, continue working on adjusting more easily when things do not go as planned or are out of her control, refrain from self negating, intentionally look for more positives within herself, practice forgiveness of self and others, positive self talk, and recognize the strength she shows working with goal-directed behaviors to move in a direction that supports her improved emotional health and outlook.  Goal review and progress/challenges noted with patient.  Next appointment within 3 weeks.  This record has been created using [employer].  Chart creation errors have been sought, but may not always have been located and corrected.  Such creation errors do not reflect on the standard of medical care provided.   Shanon Ace, LCSW

## 2022-09-18 ENCOUNTER — Ambulatory Visit: Payer: BC Managed Care – PPO | Admitting: Psychiatry

## 2022-10-09 ENCOUNTER — Ambulatory Visit (INDEPENDENT_AMBULATORY_CARE_PROVIDER_SITE_OTHER): Payer: BC Managed Care – PPO | Admitting: Psychiatry

## 2022-10-09 DIAGNOSIS — F411 Generalized anxiety disorder: Secondary | ICD-10-CM

## 2022-10-09 NOTE — Progress Notes (Signed)
[facility] Counselor/Therapist Progress Note  Patient ID: Mariah Young, MRN: [id number],    Date: 10/09/2022  Time Spent: 55 minutes   Treatment Type: Individual Therapy  Reported Symptoms: anxiety, depression  Mental Status Exam:  Appearance:   Casual and Neat     Behavior:  Appropriate, Sharing, and Motivated  Motor:  Normal  Speech/Language:   Clear and Coherent  Affect:  Depressed and anxiety  Mood:  anxious and depressed  Thought process:  goal directed  Thought content:    WNL  Sensory/Perceptual disturbances:    WNL  Orientation:  oriented to person, place, time/date, situation, day of week, month of year, year, and stated date of October 09, 2022  Attention:  Good  Concentration:  Good  Memory:  WNL  Fund of knowledge:   Good  Insight:    Good  Judgment:   Good  Impulse Control:  Good   Risk Assessment: Danger to Self:  No Self-injurious Behavior: No Danger to Others: No Duty to Warn:no Physical Aggression / Violence:No  Access to Firearms a concern: No  Gang Involvement:No   Subjective:   Patient in today reporting depression and anxiety related to personal, relationship, and work stressors. Shared and processed stressors in her current relationship due to their work hours and their distance between each other as BF lives about 1.5-2 hours away. Wanting to both take some time off to take a vacation bath but not sure when they can arrange it.  Discovering some of their differences in opinions on certain issues and decisions, including whether to have children and the timing on that.  Some concern on patient's part but also knowing that it is better for some of these things to come out before they are married so they can talk through things more openly and honestly which may actually strengthen their relationship rather than threaten it.  Realizing how these discussions are very important for them as they are already talking about their desire to marry and have  a "planned future together".  Is able to see the importance of these things arising now so they have some good talk time before they decide whether or not to become engaged.  Seem to feel better and more calm as session ended.  Also shared and work through some current work stressors for her and get some feedback on ways to approach certain situations.  Making some progress on not as frequently "assuming worst-case scenarios" especially regarding job and location.  Is more freely sharing her feelings/opinions/needs and seems to be doing so and healthy ways which she feels more confident about.  Trying to have a healthier work/life balance which is a work in progress.  Interventions: Cognitive Behavioral Therapy and Ego-Supportive  Long term goal:  1. Stabilize anxiety level while increasing ability to function on a daily basis.  2. Patient will eventually progress to where she rates her anxiety as a "3" or less on "1-10 Anxiety scale" for at least 2 months. Short term goal: Increase understanding of beliefs and messages that produce anxiety, worry,fear, and negativity.  Strategies: Identify, challenge, and replace anxious/fearful/negative with positive, hopeful, and empoweriing self-talk.  Diagnosis:   ICD-10-CM   1. Generalized anxiety disorder  F41.1      Plan:  Patient participating well in session today and showing good motivation as she worked further on her depression and anxiety related to work stressors, her relationship with BF, and personal issues.  Denies any SI and gives no  indication of that. Depression had heightened more recently some but seems to be leveling out more now.  Anxiety has remained a bit lower.  Showing more motivation when she comes to sessions and able to confront difficult circumstances in her life, past and present.  Her personal boundaries are improving.  Increased insight. Is making progress and needs to continue her work with goal-directed behaviors to keep moving  in a forward direction. Encouraged patient and practicing more positive/self affirming behaviors as noted in session including: Believing more in herself that she can continue to make significant changes and move forward, spend time with her dog which is very therapeutic for her, getting outside daily, staying in the present and focusing on what she can control or change, healthy nutrition and exercise, refrain from jumping ahead and assuming worst-case scenarios, practice more patience with herself, continue working on adjusting more easily when things do not go as planned or are out of her control, refrain from self negating, intentionally look for more positives within herself, practice forgiveness of self and others, positive self talk, and recognize the strengths she shows working with goal-directed behaviors to move in a direction that supports her improved emotional health and outlook.  Review and progress/challenges noted with patient.  Next appointment within 3 weeks.  This record has been created using [employer].  Chart creation errors have been sought, but may not always have been located and corrected.  Such creation errors do not reflect on the standard of medical care provided.   Shanon Ace, LCSW

## 2022-10-10 ENCOUNTER — Encounter: Payer: Self-pay | Admitting: Adult Health

## 2022-10-10 ENCOUNTER — Ambulatory Visit (INDEPENDENT_AMBULATORY_CARE_PROVIDER_SITE_OTHER): Payer: BC Managed Care – PPO | Admitting: Adult Health

## 2022-10-10 DIAGNOSIS — F902 Attention-deficit hyperactivity disorder, combined type: Secondary | ICD-10-CM | POA: Diagnosis not present

## 2022-10-10 DIAGNOSIS — F411 Generalized anxiety disorder: Secondary | ICD-10-CM

## 2022-10-10 DIAGNOSIS — F422 Mixed obsessional thoughts and acts: Secondary | ICD-10-CM

## 2022-10-10 MED ORDER — DESVENLAFAXINE SUCCINATE ER 100 MG PO TB24: 100.0000 mg | ORAL_TABLET | Freq: Every day | ORAL | 1 refills | Status: DC

## 2022-10-10 MED ORDER — AMPHETAMINE-DEXTROAMPHET ER 25 MG PO CP24: 50.0000 mg | ORAL_CAPSULE | Freq: Every day | ORAL | 0 refills | Status: DC

## 2022-10-10 NOTE — Progress Notes (Signed)
Real David [id number] August 31, 1995 27 y.o.  Subjective:   Patient ID:  Mariah Young is a 27 y.o. (DOB [birth year]) female.  Chief Complaint: No chief complaint on file.   HPI Mariah Young presents to the office today for follow-up of ADHD, panic disorder, GAD, and obsessional thoughts.   Describes mood today as "ok". Pleasant. Decreased tearfulness. Mood symptoms - reports "some" depression - "it's not unbearable". Reports anxiety - "not a ton". Denies irritability. Denies panic attacks. Mood is consistent. Stating "I feel like I'm doing well". Feels like medications are working well. She and partner doing well. Stable interest and motivation. Working with therapist - Rinaldo Cloud. Taking medications as prescribed. Energy levels stable. Active, has been exercising more. Enjoys some usual interests and activities. In a relationship. Lives alone with dog - "[name]". Family in [place]. Appetite adequate. Weight stable. Sleep is improving. Averages 5 to 6 hours. Focus and concentration stable. Completing tasks. Managing aspects of household. Working full time - 55 to 60 hours. Denies SI or HI.  Denies AH or VH. Denies self harm. Denies substance use.  Previous medication trials: Prozac     GAD-7    Flowsheet Row Office Visit from 11/14/2020 in [place] at [facility]  Total GAD-7 Score [place] Visit from 11/14/2020 in [place] at [facility]  PHQ-2 Total Score 3  PHQ-9 Total Score 14      Flowsheet Row ED from 04/22/2022 in [facility] at [facility] ED from 03/08/2021 in [facility] at [facility]) ED from 02/22/2021 in [facility] at [facility])  [place] No Risk No Risk No Risk        Review of Systems:  Review of Systems  Musculoskeletal:  Negative for  gait problem.  Neurological:  Negative for tremors.  Psychiatric/Behavioral:         Please refer to HPI    Medications: I have reviewed the patient's current medications.  Current Outpatient Medications  Medication Sig Dispense Refill   ALPRAZolam (XANAX) 1 MG tablet Take 1 tablet (1 mg total) by mouth 2 (two) times daily as needed for anxiety. 180 tablet 0   amphetamine-dextroamphetamine (ADDERALL XR) 25 MG 24 hr capsule Take 2 capsules by mouth daily after breakfast. 180 capsule 0   desvenlafaxine (PRISTIQ) 100 MG 24 hr tablet Take 1 tablet (100 mg total) by mouth daily. 90 tablet 1   Multiple Vitamin (ONE-DAILY MULTI-VITAMIN) TABS Take 1 tablet by mouth daily.     No current facility-administered medications for this visit.    Medication Side Effects: None  Allergies:  Allergies  Allergen Reactions   Clarithromycin Other (See Comments) and Rash    Vasculitis Vasculitis  Other reaction(s): Unknown   Penicillins Other (See Comments)    Other reaction(s): Intolerance vasculitis vasculitis    Penicillin G     Other reaction(s): Unknown    Past Medical History:  Diagnosis Date   Acute non-recurrent maxillary sinusitis 07/12/2017   ADHD (attention deficit hyperactivity disorder)    Anxiety    Cough 07/12/2017   Diaphoresis    Dysmenorrhea    IUD (intrauterine device) in place    Obesity (BMI 30.0-34.9)    Obsessive compulsive disorder     Past Medical History, Surgical history, Social history, and Family history were reviewed and updated as appropriate.  Please see review of systems for further details on the patient's review from today.   Objective:   Physical Exam:  There were no vitals taken for this visit.  Physical Exam Constitutional:      General: She is not in acute distress. Musculoskeletal:        General: No deformity.  Neurological:     Mental Status: She is alert and oriented to person, place, and time.     Coordination: Coordination  normal.  Psychiatric:        Attention and Perception: Attention and perception normal. She does not perceive auditory or visual hallucinations.        Mood and Affect: Mood normal. Mood is not anxious or depressed. Affect is not labile, blunt, angry or inappropriate.        Speech: Speech normal.        Behavior: Behavior normal.        Thought Content: Thought content normal. Thought content is not paranoid or delusional. Thought content does not include homicidal or suicidal ideation. Thought content does not include homicidal or suicidal plan.        Cognition and Memory: Cognition and memory normal.        Judgment: Judgment normal.     Comments: Insight intact     Lab Review:     Component Value Date/Time   NA 140 05/14/2021 1049   K 4.6 05/14/2021 1049   CL 101 05/14/2021 1049   CO2 22 05/14/2021 1049   GLUCOSE 90 05/14/2021 1049   GLUCOSE 95 11/14/2020 1007   BUN 12 05/14/2021 1049   CREATININE 0.70 05/14/2021 1049   CALCIUM 10.0 05/14/2021 1049   PROT 7.7 05/14/2021 1049   ALBUMIN 5.1 (H) 05/14/2021 1049   AST 17 05/14/2021 1049   ALT 13 05/14/2021 1049   ALKPHOS 76 05/14/2021 1049   BILITOT 0.2 05/14/2021 1049   GFRNONAA >60 11/14/2020 1007       Component Value Date/Time   WBC 7.5 05/14/2021 1049   WBC 10.5 11/02/2020 0828   RBC 4.85 05/14/2021 1049   RBC 4.28 11/02/2020 0828   HGB 15.1 05/14/2021 1049   HCT 44.1 05/14/2021 1049   PLT 256 11/02/2020 0828   MCV 91 05/14/2021 1049   MCH 31.1 05/14/2021 1049   MCH 30.4 11/02/2020 0828   MCHC 34.2 05/14/2021 1049   MCHC 34.1 11/02/2020 0828   RDW 12.6 05/14/2021 1049   LYMPHSABS 2.3 05/14/2021 1049   MONOABS 0.6 11/02/2020 0828   EOSABS 0.0 05/14/2021 1049   BASOSABS 0.0 05/14/2021 1049    No results found for: "POCLITH", "LITHIUM"   No results found for: "PHENYTOIN", "PHENOBARB", "VALPROATE", "CBMZ"   .res Assessment: Plan:    Plan:  PDMP reviewed  Continue:  Pristiq 100mg  every  morning Adderall XR 25mg  capsule daily  Adderall 20mg  daily - none needed today Xanax 1mg  BID - none needed today   126/68/84 - will monitor between visits  RTC 3 months  Patient advised to contact office with any questions, adverse effects, or acute worsening in signs and symptoms.  Discussed potential benefits, risk, and side effects of benzodiazepines to include potential risk of tolerance and dependence, as well as possible drowsiness.  Advised patient not to drive if experiencing drowsiness and to take lowest possible effective dose to minimize risk of dependence and tolerance.  Discussed potential benefits, risks, and side effects of stimulants with patient to include increased heart rate, palpitations, insomnia, increased anxiety, increased irritability,  or decreased appetite.  Instructed patient to contact office if experiencing any significant tolerability issues.  There are no diagnoses linked to this encounter.   Please see After Visit Summary for patient specific instructions.  Future Appointments  Date Time Provider [place]  10/30/2022  5:00 PM Shanon Ace, LCSW CP-CP None  11/20/2022  5:00 PM Shanon Ace, LCSW CP-CP None  12/11/2022  5:00 PM Shanon Ace, LCSW CP-CP None  01/01/2023  4:40 PM Faheem Ziemann, Berdie Ogren, NP CP-CP None  01/01/2023  5:00 PM Shanon Ace, LCSW CP-CP None  01/22/2023  5:00 PM Shanon Ace, LCSW CP-CP None    No orders of the defined types were placed in this encounter.   -------------------------------

## 2022-10-30 ENCOUNTER — Ambulatory Visit (INDEPENDENT_AMBULATORY_CARE_PROVIDER_SITE_OTHER): Payer: BC Managed Care – PPO | Admitting: Psychiatry

## 2022-10-30 DIAGNOSIS — F411 Generalized anxiety disorder: Secondary | ICD-10-CM | POA: Diagnosis not present

## 2022-10-30 NOTE — Progress Notes (Signed)
[facility] Counselor/Therapist Progress Note  Patient ID: Mariah Young, MRN: [id number],    Date: 10/30/2022  Time Spent: 50 minutes  Treatment Type: Individual Therapy  Reported Symptoms: anxiety, depression, some difficulty sleeping  Mental Status Exam:  Appearance:   Casual and Neat     Behavior:  Appropriate, Sharing, and Motivated  Motor:  Normal  Speech/Language:   Clear and Coherent  Affect:  Depressed and anxious  Mood:  anxious and depressed  Thought process:  goal directed  Thought content:    Obsessive thoughts  Sensory/Perceptual disturbances:    WNL  Orientation:  oriented to person, place, time/date, situation, day of week, month of year, year, and stated date of October 30, 2022  Attention:  Good  Concentration:  Good  Memory:  WNL  Fund of knowledge:   Good  Insight:    Good  Judgment:   Good  Impulse Control:  Good   Risk Assessment: Danger to Self:  No Self-injurious Behavior: No Danger to Others: No Duty to Warn:no Physical Aggression / Violence:No  Access to Firearms a concern: No  Gang Involvement:No   Subjective:   Patient in today reporting anxiety, depression, and some difficulty sleeping although realizing that is more due to bad decision making or traveling late from out of town. Some issues coming up in her relationship and at work that are adding to patient's stress, which she shared today in session. Admits sometimes not taking her meds quite as regularly on  weekend when she is off from work, sometimes just to take a break and sometimes forgetting. Fiance has expressed concerns about patient being on psych medication and he and patient have discussed this recently. Patient thinking more deeply and seriously about her relationship with BF including some of her questions/issues she is raising within herself about her medications and the future, and these issues are definitely impacting her emotionally which is why she needed to talk them  through today in session.  Does seem to be handling this better emotionally right now as in the past before she had made as much progress as she has now, I think it would have been even more of a difficult and volatile situation for patient.  Patient sometimes feeling a little threatening by any "differences of opinion" that she and BF have but is learning to tolerate this better and realize the difference of opinion is not going to necessarily equate to an ending of relationship, as discussed today.  She is improving in terms of not "assuming worst-case scenarios" is frequently and also developing more tolerance and patient in waiting for certain things that really matter to her.  Continues to work on a Comptroller.  Interventions: Cognitive Behavioral Therapy and Ego-Supportive  Long term goal:  1. Stabilize anxiety level while increasing ability to function on a daily basis.  2. Patient will eventually progress to where she rates her anxiety as a "3" or less on "1-10 Anxiety scale" for at least 2 months. Short term goal: Increase understanding of beliefs and messages that produce anxiety, worry,fear, and negativity.  Strategies: Identify, challenge, and replace anxious/fearful/negative with positive, hopeful, and empoweriing self-talk.   Diagnosis:   ICD-10-CM   1. Generalized anxiety disorder  F41.1      Plan:   Patient showing good motivation and participation in session today as she continued work on her anxiety, depression, and some issues in her relationship with BF that are rising, mostly related to some differences  in opinions that they have but patient's concern has been would the differences in opinion lead to some serious problems in their relationship.  She did really well in talking this through today and seemed to feel some relief in her stress by session end.  I think it is good that they are actually having more open communication about certain issues now as the  relationship has been deepening over time and have been talking about eventual marriage.  Patient's insight continues to increase and she is making progress overall.  Needs to continue working with goal-directed behaviors in order to keep moving forward. Encouraged patient in her practice of more positive/self affirming behaviors as noted in session including: Believing more in herself that she can continue to make significant changes and move forward, getting outside daily, stay in the present and focusing on what she can control or change, healthy nutrition and exercise, spending time with her dog which is very therapeutic for her, refrain from jumping ahead and assuming worst-case scenarios, practicing more patience with herself, continue working on adjusting more easily when things do not go as planned or are out of her control, refrain from self negating, intentionally look for more positives within herself, practice forgiveness of self and others, positive self talk and self-care, and recognize the strength she shows working with goal-directed behaviors to move in a direction that supports her improved emotional health and overall wellbeing.  Goal review and progress/challenges noted with patient.  Next appointment within 3 weeks.  This record has been created using [employer].  Chart creation errors have been sought, but may not always have been located and corrected.  Such creation errors do not reflect on the standard of medical care provided.   Mathis Fare, LCSW

## 2022-11-20 ENCOUNTER — Ambulatory Visit: Payer: BC Managed Care – PPO | Admitting: Psychiatry

## 2022-11-20 NOTE — Progress Notes (Deleted)
[facility] Counselor/Therapist Progress Note  Patient ID: Mariah Young, MRN: [id number],    Date: 11/20/2022  Time Spent: ***   Treatment Type: {CHL AMB THERAPY TYPES:[id number]}  Reported Symptoms: ***  Mental Status Exam:  Appearance:   {PSY:22683}     Behavior:  {PSY:21022743}  Motor:  {PSY:22302}  Speech/Language:   {PSY:22685}  Affect:  {PSY:22687}  Mood:  {PSY:31886}  Thought process:  {PSY:31888}  Thought content:    {PSY:[phone]}  Sensory/Perceptual disturbances:    {PSY:[id number]}  Orientation:  {PSY:30297}  Attention:  {PSY:22877}  Concentration:  {[phone]}  Memory:  {[phone]}  Fund of knowledge:   {[phone]}  Insight:    {[phone]}  Judgment:   {[phone]}  Impulse Control:  {[phone]}   Risk Assessment: Danger to Self:  {PSY:22692} Self-injurious Behavior: {PSY:22692} Danger to Others: {PSY:22692} Duty to Warn:{PSY:311194} Physical Aggression / Violence:{PSY:21197} Access to Firearms a concern: {PSY:21197} Gang Involvement:{PSY:21197}  Subjective: ***   Patient in session today reporting anxiety    Patient in today reporting anxiety, depression, and some difficulty sleeping although realizing that is more due to bad decision making or traveling late from out of town. Some issues coming up in her relationship and at work that are adding to patient's stress, which she shared today in session. Admits sometimes not taking her meds quite as regularly on  weekend when she is off from work, sometimes just to take a break and sometimes forgetting. Fiance has expressed concerns about patient being on psych medication and he and patient have discussed this recently. Patient thinking more deeply and seriously about her relationship with BF including some of her questions/issues she is raising within herself about her medications and the future, and these issues are definitely impacting her emotionally which is why she  needed to talk them through today in session.  Does seem to be handling this better emotionally right now as in the past before she had made as much progress as she has now, I think it would have been even more of a difficult and volatile situation for patient.  Patient sometimes feeling a little threatening by any "differences of opinion" that she and BF have but is learning to tolerate this better and realize the difference of opinion is not going to necessarily equate to an ending of relationship, as discussed today.  She is improving in terms of not "assuming worst-case scenarios" is frequently and also developing more tolerance and patient in waiting for certain things that really matter to her.  Continues to work on a Comptroller.     Interventions: {[phone]}  Long term goal:  1. Stabilize anxiety level while increasing ability to function on a daily basis.  2. Patient will eventually progress to where she rates her anxiety as a "3" or less on "1-10 Anxiety scale" for at least 2 months. Short term goal: Increase understanding of beliefs and messages that produce anxiety, worry,fear, and negativity.  Strategies: Identify, challenge, and replace anxious/fearful/negative with positive, hopeful, and empoweriing self-talk.  Diagnosis:No diagnosis found.  Plan: ***Patient today showing good participation and motivation in session as she worked on her anxiety    on her anxiety, depression, and some issues in her relationship with BF that are rising, mostly related to some differences in opinions that they have but patient's concern has been would the differences in opinion lead to some serious problems in their relationship.  She did really well in talking this through today and seemed to feel some relief  in her stress by session end.  I think it is good that they are actually having more open communication about certain issues now as the relationship has been deepening over  time and have been talking about eventual marriage.  Patient's insight continues to increase and she is making progress overall.  Needs to continue working with goal-directed behaviors in order to keep moving forward.    //////////////////////////////////////////////////////////////////////////////////////////////////////   Encouraged patient and practicing more positive/self affirming behaviors as noted in session including: Believing more in herself that she can make significant changes and move forward in life, getting outside daily, stay in the present and focused on what she can control or change, healthy nutrition and exercise, spending time with her dog which is very therapeutic for her, refrain from jumping ahead and assuming worst-case scenarios, practicing more patience within herself, continue working on adjusting more easily when things do not go as planned or are out of her control, refrain from self negating, intentionally look for more positives within herself, practice forgiveness of self and others, positive self talk and self-care, and realize the strengths she shows working with goal-directed behaviors to move in a direction that supports her improved emotional health and outlook.    Self rating scales: 1-10 depression scale- 1-10 anxiety scale- 1-10 hopefulness scale-    Goal review and progress/challenges noted with patient.   Next appointment within 2 to 3 weeks.  Mathis Fare, LCSW

## 2022-12-11 ENCOUNTER — Ambulatory Visit (INDEPENDENT_AMBULATORY_CARE_PROVIDER_SITE_OTHER): Payer: BC Managed Care – PPO | Admitting: Psychiatry

## 2022-12-11 DIAGNOSIS — F411 Generalized anxiety disorder: Secondary | ICD-10-CM

## 2022-12-11 NOTE — Progress Notes (Signed)
[facility] Counselor/Therapist Progress Note  Patient ID: Mariah Young, MRN: [id number],    Date: 12/11/2022  Time Spent: 55 minutes   Treatment Type: Individual Therapy  Reported Symptoms: anxiety, depression  Mental Status Exam:  Appearance:   Casual     Behavior:  Appropriate, Sharing, and Motivated  Motor:  Normal  Speech/Language:   Clear and Coherent  Affect:  Depressed and anxious  Mood:  anxious and depressed  Thought process:  goal directed  Thought content:    Some obsessive thoughts  Sensory/Perceptual disturbances:    WNL  Orientation:  oriented to person, place, time/date, situation, day of week, month of year, year, and stated date of Dec 11, 2022  Attention:  Good  Concentration:  Good  Memory:  WNL  Fund of knowledge:   Good  Insight:    Good  Judgment:   Good  Impulse Control:  Good   Risk Assessment: Danger to Self:  No Self-injurious Behavior: No Danger to Others: No Duty to Warn:no Physical Aggression / Violence:No  Access to Firearms a concern: No  Gang Involvement:No   Subjective:   Patient in session today reporting anxiety and depression with anxiety being the stronger symptom. Working more today on her anxiety and depression, along with better self-care. Work stress up! Feeling better in her relationship with BF as they talk about their future. Talked through some work concerns and looking at what she can change and what she can't change, and how to take better care of her self in the midst of demanding and often unpredictable work environment. Looked at and discussed some of the progress she is making personally, professionally, and in her relationship with BF. More confidence in her job and relationship. Wants to eventually have more work/life balance and tries to make some changes in that direction as she is able. Trying to not assume worst case scenarios as much.  Interventions: Cognitive Behavioral Therapy and Ego-Supportive  Long  term goal:  1. Stabilize anxiety level while increasing ability to function on a daily basis.  2. Patient will eventually progress to where she rates her anxiety as a "3" or less on "1-10 Anxiety scale" for at least 2 months. Short term goal: Increase understanding of beliefs and messages that produce anxiety, worry,fear, and negativity.  Strategies: Identify, challenge, and replace anxious/fearful/negative with positive, hopeful, and empoweriing self-talk.  Diagnosis:   ICD-10-CM   1. Generalized anxiety disorder  F41.1      Plan: Patient today actively participating in session and showing good motivation as she worked on her anxiety and depression, noting progress and "next steps". Is making progress and needs to continue her work with goal-directed behaviors to move in a forward direction.  Encouraged patient in practicing more positive/self affirming behaviors as noted in session including: Believing more in herself that she can continue to make significant changes and move forward, stay in the present and focused on what she can control or change, healthy nutrition and exercise, spending time with her dog which is very therapeutic for her, refrain from jumping ahead and assuming worst-case scenarios, practice more patience with herself, continue working on adjusting more easily when things do not go as planned or are out of her control, refrain from self negating, intentionally look for more positives within herself, practice forgiveness of self and others, positive self talk, and realize the strength she shows working with goal-directed behaviors to move in a direction that supports her improved emotional health and  outlook.  Goal review and progress/challenges noted with patient.  Next appointment within 2 to 3 weeks.  Mathis Fare, LCSW

## 2023-01-01 ENCOUNTER — Ambulatory Visit (INDEPENDENT_AMBULATORY_CARE_PROVIDER_SITE_OTHER): Payer: BC Managed Care – PPO | Admitting: Adult Health

## 2023-01-01 ENCOUNTER — Ambulatory Visit (INDEPENDENT_AMBULATORY_CARE_PROVIDER_SITE_OTHER): Payer: BC Managed Care – PPO | Admitting: Psychiatry

## 2023-01-01 ENCOUNTER — Encounter: Payer: Self-pay | Admitting: Adult Health

## 2023-01-01 DIAGNOSIS — F411 Generalized anxiety disorder: Secondary | ICD-10-CM

## 2023-01-01 DIAGNOSIS — F902 Attention-deficit hyperactivity disorder, combined type: Secondary | ICD-10-CM | POA: Diagnosis not present

## 2023-01-01 DIAGNOSIS — F410 Panic disorder [episodic paroxysmal anxiety] without agoraphobia: Secondary | ICD-10-CM

## 2023-01-01 DIAGNOSIS — F422 Mixed obsessional thoughts and acts: Secondary | ICD-10-CM

## 2023-01-01 MED ORDER — DESVENLAFAXINE SUCCINATE ER 100 MG PO TB24: 100.0000 mg | ORAL_TABLET | Freq: Every day | ORAL | 1 refills | Status: DC

## 2023-01-01 MED ORDER — AMPHETAMINE-DEXTROAMPHET ER 25 MG PO CP24: 50.0000 mg | ORAL_CAPSULE | Freq: Every day | ORAL | 0 refills | Status: DC

## 2023-01-01 NOTE — Progress Notes (Signed)
      [facility] Counselor/Therapist Progress Note  Patient ID: Mariah Young, MRN: [id number],    Date: 01/01/2023  Time Spent: 55 minutes  Treatment Type: Individual Therapy  Reported Symptoms: anxiety, depression, some feelings of being overwhelmed; confidential relationship issues  Mental Status Exam:  Appearance:   Casual and Neat     Behavior:  Appropriate, Sharing, and Motivated  Motor:  Normal  Speech/Language:   Clear and Coherent  Affect:  Anxious, depressed  Mood:  anxious and depressed  Thought process:  goal directed  Thought content:    Some obsessive thoughts  Sensory/Perceptual disturbances:    WNL  Orientation:  oriented to person, place, time/date, situation, day of week, month of year, year, and stated date of January 01, 2023  Attention:  Good  Concentration:  Good  Memory:  WNL  Fund of knowledge:   Good  Insight:    Good  Judgment:   Good  Impulse Control:  Good   Risk Assessment: Danger to Self:  No Self-injurious Behavior: No Danger to Others: No Duty to Warn:no Physical Aggression / Violence:No  Access to Firearms a concern: No  Gang Involvement:No   Subjective: Patient in today reporting anxiety, depression and feeling overwhelmed with work and life relationships. Today discussing some recent confidential issues in her relationship with BF. Patient tearfully sharing and processing her hurt in relationship with BF and their future and talked specifically about her hurt and her fears. Discussed trust issues involved and patient expressing some mixed feeling and disappointment. Stress is high at work. Talked today about some work concerns and what she can change versus cannot change, how she might cope better with the things she cannot change.  Interventions: Cognitive Behavioral Therapy and Ego-Supportive  Long term goal:  1. Stabilize anxiety level while increasing ability to function on a daily basis.  2. Patient will eventually progress to  where she rates her anxiety as a "3" or less on "1-10 Anxiety scale" for at least 2 months. Short term goal: Increase understanding of beliefs and messages that produce anxiety, worry,fear, and negativity.  Strategies: Identify, challenge, and replace anxious/fearful/negative with positive, hopeful, and empoweriing self-talk.  Diagnosis:   ICD-10-CM   1. Generalized anxiety disorder  F41.1      Plan:  Patient today participating well in session as she worked further on her anxiety ad depression. Is making noticeable progress in the midst of stressful issues in relationship and stressful work environment. Needs to continue working with goal directed behaviors to move in a forward direction. Encouraged patient in her practice of more positive/self affirming behaviors including:Believe more in herself and the changes she can make to move forward, stay in the present and focused on what  she can control, spending time with her dog which is very therapeutic for her, refrain from jumping ahead and assuming worst-case scenarios, practice more patience with herself, continue working on adjusting more easily when things do not go as planned or are out of her control, refrain from self negating, intentionally look for more positives within herself, practice forgiveness of self and others, positive self talk, and recognize the strengths she shows working with goal-directed behaviors to move in a direction that supports her improved emotional health and overall wellbeing.  Goal review and progress/challenges noted with patient.  Next appointment within 2 to 3 weeks.   Mathis Fare, LCSW

## 2023-01-01 NOTE — Progress Notes (Signed)
[name] [id number] 26-Jun-1996 27 y.o.  Subjective:   Patient ID:  Mariah Young is a 27 y.o. (DOB [birth year]) female.  Chief Complaint: No chief complaint on file.   HPI Krystil Dantin presents to the office today for follow-up of ADHD, panic disorder, GAD, and obsessional thoughts.   Describes mood today as "ok". Pleasant. Decreased tearfulness. Mood symptoms - denies depression and irritability. Reports anxiety at times.Denies panic attacks. Mood is consistent. Stating "I feel like I'm doing pretty good". Feels like medications are working well. She and partner doing well. Stable interest and motivation. Working with therapist - Rockne Menghini. Taking medications as prescribed. Energy levels stable. Active, has been exercising more. Enjoys some usual interests and activities. In a relationship. Lives alone with dog (7 and 1/2)- "[name]". Family in [place]. Appetite adequate. Weight stable. Sleep is consistent. Averages 5 to 6 hours. Focus and concentration stable. Completing tasks. Managing aspects of household. Working full time - 55 to 60 hours. Denies SI or HI.  Denies AH or VH. Denies self harm. Denies substance use.  Previous medication trials: Prozac   GAD-7    Flowsheet Row Office Visit from 11/14/2020 in [facility] at [facility]  Total GAD-7 Score 11      PHQ2-9    Flowsheet Row Office Visit from 11/14/2020 in [facility] at [facility]  PHQ-2 Total Score 3  PHQ-9 Total Score 14      Flowsheet Row ED from 04/22/2022 in [facility] ED from 03/08/2021 in [facility] at [facility] ED from 02/22/2021 in [facility] at [facility])  C-SSRS RISK CATEGORY No Risk No Risk No Risk        Review of Systems:  Review of Systems  Musculoskeletal:  Negative for gait problem.  Neurological:   Negative for tremors.  Psychiatric/Behavioral:         Please refer to HPI    Medications: I have reviewed the patient's current medications.  Current Outpatient Medications  Medication Sig Dispense Refill   ALPRAZolam (XANAX) 1 MG tablet Take 1 tablet (1 mg total) by mouth 2 (two) times daily as needed for anxiety. 180 tablet 0   amphetamine-dextroamphetamine (ADDERALL XR) 25 MG 24 hr capsule Take 2 capsules by mouth daily after breakfast. 180 capsule 0   desvenlafaxine (PRISTIQ) 100 MG 24 hr tablet Take 1 tablet (100 mg total) by mouth daily. 90 tablet 1   Multiple Vitamin (ONE-DAILY MULTI-VITAMIN) TABS Take 1 tablet by mouth daily.     No current facility-administered medications for this visit.    Medication Side Effects: None  Allergies:  Allergies  Allergen Reactions   Clarithromycin Other (See Comments) and Rash    Vasculitis Vasculitis  Other reaction(s): Unknown   Penicillins Other (See Comments)    Other reaction(s): Intolerance vasculitis vasculitis    Penicillin G     Other reaction(s): Unknown    Past Medical History:  Diagnosis Date   Acute non-recurrent maxillary sinusitis 07/12/2017   ADHD (attention deficit hyperactivity disorder)    Anxiety    Cough 07/12/2017   Diaphoresis    Dysmenorrhea    IUD (intrauterine device) in place    Obesity (BMI 30.0-34.9)    Obsessive compulsive disorder     Past Medical History, Surgical history, Social history, and Family history were reviewed and updated as appropriate.   Please see review of systems  for further details on the patient's review from today.   Objective:   Physical Exam:  There were no vitals taken for this visit.  Physical Exam Constitutional:      General: She is not in acute distress. Musculoskeletal:        General: No deformity.  Neurological:     Mental Status: She is alert and oriented to person, place, and time.     Coordination: Coordination normal.  Psychiatric:         Attention and Perception: Attention and perception normal. She does not perceive auditory or visual hallucinations.        Mood and Affect: Mood normal. Mood is not anxious or depressed. Affect is not labile, blunt, angry or inappropriate.        Speech: Speech normal.        Behavior: Behavior normal.        Thought Content: Thought content normal. Thought content is not paranoid or delusional. Thought content does not include homicidal or suicidal ideation. Thought content does not include homicidal or suicidal plan.        Cognition and Memory: Cognition and memory normal.        Judgment: Judgment normal.     Comments: Insight intact     Lab Review:     Component Value Date/Time   NA 140 05/14/2021 1049   K 4.6 05/14/2021 1049   CL 101 05/14/2021 1049   CO2 22 05/14/2021 1049   GLUCOSE 90 05/14/2021 1049   GLUCOSE 95 11/14/2020 1007   BUN 12 05/14/2021 1049   CREATININE 0.70 05/14/2021 1049   CALCIUM 10.0 05/14/2021 1049   PROT 7.7 05/14/2021 1049   ALBUMIN 5.1 (H) 05/14/2021 1049   AST 17 05/14/2021 1049   ALT 13 05/14/2021 1049   ALKPHOS 76 05/14/2021 1049   BILITOT 0.2 05/14/2021 1049   GFRNONAA >60 11/14/2020 1007       Component Value Date/Time   WBC 7.5 05/14/2021 1049   WBC 10.5 11/02/2020 0828   RBC 4.85 05/14/2021 1049   RBC 4.28 11/02/2020 0828   HGB 15.1 05/14/2021 1049   HCT 44.1 05/14/2021 1049   PLT 256 11/02/2020 0828   MCV 91 05/14/2021 1049   MCH 31.1 05/14/2021 1049   MCH 30.4 11/02/2020 0828   MCHC 34.2 05/14/2021 1049   MCHC 34.1 11/02/2020 0828   RDW 12.6 05/14/2021 1049   LYMPHSABS 2.3 05/14/2021 1049   MONOABS 0.6 11/02/2020 0828   EOSABS 0.0 05/14/2021 1049   BASOSABS 0.0 05/14/2021 1049    No results found for: "POCLITH", "LITHIUM"   No results found for: "PHENYTOIN", "PHENOBARB", "VALPROATE", "CBMZ"   .res Assessment: Plan:    Plan:  PDMP reviewed  Continue:  Pristiq 100mg  every morning Adderall XR 25mg  capsule  daily  Adderall 20mg  daily - no longer taking Xanax 1mg  BID - no longer taking  125/87/89 - will monitor between visits  RTC 3 months  Patient advised to contact office with any questions, adverse effects, or acute worsening in signs and symptoms.  Discussed potential benefits, risk, and side effects of benzodiazepines to include potential risk of tolerance and dependence, as well as possible drowsiness.  Advised patient not to drive if experiencing drowsiness and to take lowest possible effective dose to minimize risk of dependence and tolerance.  Discussed potential benefits, risks, and side effects of stimulants with patient to include increased heart rate, palpitations, insomnia, increased anxiety, increased irritability, or decreased appetite.  Instructed patient  to contact office if experiencing any significant tolerability issues.  There are no diagnoses linked to this encounter.   Please see After Visit Summary for patient specific instructions.  Future Appointments  Date Time Provider Department Center  01/01/2023  4:40 PM Savior Himebaugh, Thereasa Solo, NP [facility] None  01/01/2023  5:00 PM Mathis Fare, LCSW [facility] None  01/22/2023  5:00 PM Mathis Fare, LCSW [facility] None  02/13/2023  5:00 PM Mathis Fare, LCSW [facility] None  03/05/2023  5:00 PM Mathis Fare, LCSW [facility] None  03/26/2023  4:40 PM Liberti Appleton, Thereasa Solo, NP [facility] None  03/26/2023  5:00 PM Mathis Fare, LCSW [facility] None  04/16/2023  5:00 PM Mathis Fare, LCSW [facility] None  05/07/2023  5:00 PM Mathis Fare, LCSW [facility] None    No orders of the defined types were placed in this encounter.   -------------------------------

## 2023-01-22 ENCOUNTER — Ambulatory Visit (INDEPENDENT_AMBULATORY_CARE_PROVIDER_SITE_OTHER): Payer: BC Managed Care – PPO | Admitting: Psychiatry

## 2023-01-22 DIAGNOSIS — F411 Generalized anxiety disorder: Secondary | ICD-10-CM | POA: Diagnosis not present

## 2023-01-22 NOTE — Progress Notes (Signed)
[facility] Counselor/Therapist Progress Note  Patient ID: Mariah Young, MRN: [id number],    Date: 01/22/2023  Time Spent:  58 minutes  Treatment Type: Individual Therapy  Reported Symptoms: anxiety, depression  Mental Status Exam:  Appearance:   Casual     Behavior:  Appropriate, Sharing, and Motivated  Motor:  Normal  Speech/Language:   Clear and Coherent  Affect:  Anxious, some depression  Mood:  anxious and depressed  Thought process:  goal directed  Thought content:    Some obsessive thoughts  Sensory/Perceptual disturbances:    WNL  Orientation:  oriented to person, place, time/date, situation, day of week, month of year, year, and stated date of January 22, 2023  Attention:  Good  Concentration:  Good  Memory:  WNL  Fund of knowledge:   Good  Insight:    Good  Judgment:   Good  Impulse Control:  Good   Risk Assessment: Danger to Self:  No Self-injurious Behavior: No Danger to Others: No Duty to Warn:no Physical Aggression / Violence:No  Access to Firearms a concern: No  Gang Involvement:No   Subjective:   Patient in for session today and reporting anxiety and depression mostly related to some overwhelmedness at work, relationships, uncertainty at work, and in relationship with BF. Upset today re: recent work evaluation and she wasn't happy with the results she got, but is willing to work on various areas of her job to get her numbers up. But today is very tearful and anxious and talked through her hurt and sadness in more detail. Also is wanting to be able to move to [place] to be closer to her BF and they are talking through possibilities but nothing definite yet. Feeling better about some of her and fiance's communication as they are able to talk directly and still being respectful even when they don't exactly agree on some things. Really thinking through significant issues. Has been hard on herself about her review and working on this more prior to ending  session today. Has not brought up concern she had with BF that was discussed some last visit as she wants that to be done in person. (Not all details included in this note due to sensitivity of issue and due to patient's privacy needs.)  Interventions: Cognitive Behavioral Therapy and Ego-Supportive  Long term goal:  1. Stabilize anxiety level while increasing ability to function on a daily basis.  2. Patient will eventually progress to where she rates her anxiety as a "3" or less on "1-10 Anxiety scale" for at least 2 months. Short term goal: Increase understanding of beliefs and messages that produce anxiety, worry,fear, and negativity.  Strategies: Identify, challenge, and replace anxious/fearful/negative with positive, hopeful, and empoweriing self-talk.   Diagnosis:   ICD-10-CM   1. Generalized anxiety disorder  F41.1      Plan:  Patient in session today showing good motivation and participation with continued work on her goals and is noticeable progress even when confronted with stressful situations in her work environment or in close relationships.  Patient needs to continue working with goal-directed behaviors in order to keep moving in a forward direction. Encouraged patient and practicing more positive/self affirming behaviors including: Spending time with her dog which is very therapeutic for her, believing more in herself and the changes she can and wants to make to move forward, stay in the present focused on what she can control, refrain from jumping ahead and assuming worst-case scenarios, practice more patience with  herself, continue working on adjusting more easily when things do not go as planned or are out of her control, refrain from self negating, intentionally look for more positives within herself, practice forgiveness of self and others, positive self talk, and recognize the strength she shows working with goal-directed behaviors to move in a direction that supports her  improved emotional health and outlook.  Goal review and progress/challenges noted with patient.  Next appointment within 2 to 3 weeks.   Mathis Fare, LCSW

## 2023-02-13 ENCOUNTER — Ambulatory Visit (INDEPENDENT_AMBULATORY_CARE_PROVIDER_SITE_OTHER): Payer: BC Managed Care – PPO | Admitting: Psychiatry

## 2023-02-13 DIAGNOSIS — F411 Generalized anxiety disorder: Secondary | ICD-10-CM | POA: Diagnosis not present

## 2023-02-13 NOTE — Progress Notes (Signed)
[facility] Counselor/Therapist Progress Note  Patient ID: Mariah Young, MRN: [id number],    Date: 02/13/2023  Time Spent: 53 minutes   Treatment Type: Individual Therapy  Reported Symptoms: anxiety, depression  Mental Status Exam:  Appearance:   Casual     Behavior:  Appropriate, Sharing, and Motivated  Motor:  Normal  Speech/Language:   Clear and Coherent  Affect:  Depressed and anxious  Mood:  anxious and depressed  Thought process:  goal directed  Thought content:    WNL  Sensory/Perceptual disturbances:    WNL  Orientation:  oriented to person, place, time/date, situation, day of week, month of year, year, and stated date of February 13, 2023  Attention:  Good  Concentration:  Good  Memory:  WNL  Fund of knowledge:   Good  Insight:    Good  Judgment:   Good  Impulse Control:  Good and Fair   Risk Assessment: Danger to Self:  No Self-injurious Behavior: No Danger to Others: No Duty to Warn:no Physical Aggression / Violence:No  Access to Firearms a concern: No  Gang Involvement:No   Subjective:  Patient working well in session today and reporting depression and anxiety related to personal, family, relationship with BF, and some overwhelmedness/uncertainty at work. Frustrations at work about certain rules and with certain people which she she needed to talk through today and get some help and problem solving some better ways of managing certain stressors.  Her parents came up recently to visit which went well. Also starting to look more into the future for her and her BF. States "I know I need to talk more openly and that is what I'm trying to do," and did so today. Communication with fiance continues to grow in a healthy way. Senses improved relationships and closeness within the family. Tolerating the "wait time" in her eventually getting to [facility] and be able to live where her BF lives.  They seem to be doing pretty well and managing the stressors of living apart  and are looking forward to the time when she can moved to the [place] area and they follow through on their plans of marrying.  Patient continues to better understand some of her anxious/worrisome/fearful moments and actually has been able to do some healthy grounding of herself to deal with some of these feelings.  Use of positive and empowering self talk continues to be helpful for her personally and professionally.  Interventions: Cognitive Behavioral Therapy and Ego-Supportive  Long term goal:  1. Stabilize anxiety level while increasing ability to function on a daily basis.  2. Patient will eventually progress to where she rates her anxiety as a "3" or less on "1-10 Anxiety scale" for at least 2 months. Short term goal: Increase understanding of beliefs and messages that produce anxiety, worry,fear, and negativity.  Strategies: Identify, challenge, and replace anxious/fearful/negative with positive, hopeful, and empoweriing self-talk.  Diagnosis:   ICD-10-CM   1. Generalized anxiety disorder  F41.1      Plan:   Patient in session today actively participating and showing good motivation focusing on her anxiety and depression, along with some improving stress management particularly in relationships and stressful situations both in and outside of work.  Patient notices progress that she is making and she needs to continue working with goal-directed behaviors in order to keep moving and a forward direction which contributes to her personal life and also her work life.  Encouraged patient to be practicing more positive/self affirming behaviors  as noted in session including: Staying in the present focusing on what she can control versus cannot, refrain from jumping ahead and assuming worst-case scenarios, practicing more patience with herself and others, continue working on adjusting more easily when things do not go as planned or are out of her control, refrain from self negating, intentionally  looking for more positives within herself, spending time with her dog which is very therapeutic for her, believing more in herself and the changes she can make to move forward, practice forgiveness of herself and others, positive self talk, and realize the strength she shows working with goal-directed behaviors to move in a direction that supports her improved emotional health and wellbeing.  Goal review and progress/challenges noted with patient.  Next appointment within 2 to 3 weeks.  Mathis Fare, LCSW

## 2023-03-05 ENCOUNTER — Ambulatory Visit (INDEPENDENT_AMBULATORY_CARE_PROVIDER_SITE_OTHER): Payer: BC Managed Care – PPO | Admitting: Psychiatry

## 2023-03-05 DIAGNOSIS — F411 Generalized anxiety disorder: Secondary | ICD-10-CM

## 2023-03-05 NOTE — Progress Notes (Signed)
[facility] Counselor/Therapist Progress Note  Patient ID: Mariah Young, MRN: [id number],    Date: 03/05/2023  Time Spent: 53 minutes   Treatment Type: Individual Therapy  Reported Symptoms: anxiety, depression  Mental Status Exam:  Appearance:   Neat     Behavior:  Appropriate, Sharing, and Motivated  Motor:  Normal  Speech/Language:   Clear and Coherent  Affect:  Depressed and anxious  Mood:  anxious and depressed  Thought process:  goal directed  Thought content:    Some obsessive thoughts  Sensory/Perceptual disturbances:    WNL  Orientation:  oriented to person, place, time/date, situation, day of week, month of year, year, and stated date of Aug. 14, 2024  Attention:  Good  Concentration:  Good  Memory:  WNL  Fund of knowledge:   Good  Insight:    Good  Judgment:   Good  Impulse Control:  Good   Risk Assessment: Danger to Self:  No Self-injurious Behavior: No Danger to Others: No Duty to Warn:no Physical Aggression / Violence:No  Access to Firearms a concern: No  Gang Involvement:No   Subjective:  Patient motivated and working in session today on her anxiety and depression mostly related to work and personal life. The uncertainty and overwhelmedness at work continues and patient shared more on this today and how it impacts her. Looking at some decisions she is considering that are very confidential as she discussed today. Tearfully processed her concerns and choices which she is considering. Some more sensitive information shared re: her relationship with BF. (Not all details included in this note due to patient privacy needs.) Encouraged patient to look at her priorities and not make too sudden of a decision, giving herself time to really think through her options, and weighing each option as to it's potential benefit for her; while also considering a "back-up plan" if original plan does not work out. Encouraged more positive, self-caring, self-talk and to  continue making more well thought-through decisions.  Interventions: Cognitive Behavioral Therapy and Ego-Supportive  Long term goal:  1. Stabilize anxiety level while increasing ability to function on a daily basis.  2. Patient will eventually progress to where she rates her anxiety as a "3" or less on "1-10 Anxiety scale" for at least 2 months. Short term goal: Increase understanding of beliefs and messages that produce anxiety, worry,fear, and negativity.  Strategies: Identify, challenge, and replace anxious/fearful/negative with positive, hopeful, and empoweriing self-talk.   Diagnosis:   ICD-10-CM   1. Generalized anxiety disorder  F41.1      Plan:  Patient today in session showing good motivation as she continues to work on her anxiety, depression, and stress management and relationships and situations in and outside of work.  She is making progress and needs to continue her work with goal-directed behaviors in order to keep moving in a forward direction. Encouraged patient in her practicing more positive and self-affirming behaviors as noted in session including: Practicing more patience with herself and others, continue working on adjusting more easily when things do not go as planned or are out of her control, stay in the present focusing on what she can control versus cannot, refrain from jumping ahead and assuming worst-case scenarios, spending time with her dog and BF which is very therapeutic for her, believing more in herself and the changes that she can continue to make and move forward, use of positive self talk, and recognize the strength she shows working with goal-directed behaviors to move  in a direction that supports her improved emotional health and outlook into the future.  Goal review and progress/challenges noted with patient.  Next appointment within 3 weeks.   Mathis Fare, LCSW

## 2023-03-26 ENCOUNTER — Ambulatory Visit (INDEPENDENT_AMBULATORY_CARE_PROVIDER_SITE_OTHER): Payer: BC Managed Care – PPO | Admitting: Psychiatry

## 2023-03-26 ENCOUNTER — Encounter: Payer: Self-pay | Admitting: Adult Health

## 2023-03-26 ENCOUNTER — Ambulatory Visit (INDEPENDENT_AMBULATORY_CARE_PROVIDER_SITE_OTHER): Payer: BC Managed Care – PPO | Admitting: Adult Health

## 2023-03-26 DIAGNOSIS — F422 Mixed obsessional thoughts and acts: Secondary | ICD-10-CM | POA: Diagnosis not present

## 2023-03-26 DIAGNOSIS — F410 Panic disorder [episodic paroxysmal anxiety] without agoraphobia: Secondary | ICD-10-CM

## 2023-03-26 DIAGNOSIS — F411 Generalized anxiety disorder: Secondary | ICD-10-CM | POA: Diagnosis not present

## 2023-03-26 DIAGNOSIS — F902 Attention-deficit hyperactivity disorder, combined type: Secondary | ICD-10-CM | POA: Diagnosis not present

## 2023-03-26 MED ORDER — AMPHETAMINE-DEXTROAMPHET ER 25 MG PO CP24: 50.0000 mg | ORAL_CAPSULE | Freq: Every day | ORAL | 0 refills | Status: AC

## 2023-03-26 MED ORDER — DESVENLAFAXINE SUCCINATE ER 100 MG PO TB24: 100.0000 mg | ORAL_TABLET | Freq: Every day | ORAL | 1 refills | Status: AC

## 2023-03-26 NOTE — Progress Notes (Signed)
[facility] Counselor/Therapist Progress Note  Patient ID: Mariah Young, MRN: [id number],    Date: 03/26/2023  Time Spent: 55 minutes   Treatment Type: Individual Therapy  Reported Symptoms: anxiety, depression  Mental Status Exam:  Appearance:   Neat     Behavior:  Appropriate, Sharing, and Motivated  Motor:  Normal  Speech/Language:   Clear and Coherent  Affect:  Depressed and anxious  Mood:  anxious and depressed  Thought process:  goal directed  Thought content:    WNL  Sensory/Perceptual disturbances:    WNL  Orientation:  oriented to person, place, time/date, situation, day of week, month of year, year, and stated date of Sept. 4, 2024  Attention:  Good  Concentration:  Good  Memory:  WNL  Fund of knowledge:   Good  Insight:    Good and Fair  Judgment:   Good  Impulse Control:  Good   Risk Assessment: Danger to Self:  No Self-injurious Behavior: No Danger to Others: No Duty to Warn:no Physical Aggression / Violence:No  Access to Firearms a concern: No  Gang Involvement:No   Subjective:  Patient today actively participating and showing good motivation in session working further on her anxiety and depression related to personal life and her job environment, leading to frequent feelings of uncertainty and overwhelmedness. Job issues discussed and patient has applied for another position. [name] has stated they may do a trial and let her work the position and see how it goes, but she'll need to apply for the position "and there's a long process involved."  "Trust issues at work" are an issue and patient needed more time today in session to process her mixed feelings about the situation and is still anxious to get a position closer to BF as they plan to eventually marry. Patient encouraged to keep priorities in mind as she evaluates and considers various options before trying to make decisions. To focus on better self-care, positive self-talk, and making good  decisions.   Interventions: Cognitive Behavioral Therapy and Ego-Supportive  Long term goal:  1. Stabilize anxiety level while increasing ability to function on a daily basis.  2. Patient will eventually progress to where she rates her anxiety as a "3" or less on "1-10 Anxiety scale" for at least 2 months. Short term goal: Increase understanding of beliefs and messages that produce anxiety, worry,fear, and negativity.  Strategies: Identify, challenge, and replace anxious/fearful/negative with positive, hopeful, and empoweriing self-talk.  Diagnosis:   ICD-10-CM   1. 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 Patient is making progress and needs to continue working with goal-directed behaviors so as to keep moving in a positive direction. Reminded and encouraged patient in her practice of more positive and self affirming behaviors as noted in sessions including: Practicing more patience with herself and others, continue working on adjusting more easily when things do not go as planned or are out of her control, remain in the present focusing on what she can control, refrain from jumping ahead and assuming worst-case scenarios, spending time with her dog and BF which is very therapeutic for her, believing more in herself and the changes that she can continue to make and move forward, use of positive self talk, and realize the strength she shows working  with goal-directed behaviors to move in a direction that supports her improved emotional health and overall wellbeing.  Goal review and progress/challenges noted with patient.  Next appointment within 3 weeks.   Mathis Fare, LCSW

## 2023-03-26 NOTE — Progress Notes (Signed)
Mariah Young [id number] 1995/09/06 27 y.o.  Subjective:   Patient ID:  Mariah Young is a 27 y.o. (DOB 1996-03-18) female.  Chief Complaint: No chief complaint on file.   HPI Mariah Young presents to the office today for follow-up of ADHD, panic disorder, GAD, and obsessional thoughts.   Describes mood today as "ok". Pleasant. Decreased tearfulness. Mood symptoms - reports some depression and anxiety. Denies irritability. Denies panic attacks. Reports some worry, rumination, and over thinking. Mood is consistent. Stating "overall, I feel like I'm doing ok". Feels like medications are working well. She and partner doing well. Stable interest and motivation. Working with therapist - Rockne Menghini. Taking medications as prescribed. Energy levels stable. Active, has been exercising more. Enjoys some usual interests and activities. In a relationship. Lives alone with dog - "[name]". Family in [place]. Appetite adequate. Weight stable. Sleep is consistent. Averages 5 to 6 hours. Focus and concentration stable. Completing tasks. Managing aspects of household. Working full time - 55 to 60 hours. Denies SI or HI.  Denies AH or VH. Denies self harm. Denies substance use.  Previous medication trials: Prozac    GAD-7    Flowsheet Row Office Visit from 11/14/2020 in [facility] at [facility]  Total GAD-7 Score 11      PHQ2-9    Flowsheet Row Office Visit from 11/14/2020 in [facility] Primary [facility] Medicine at [facility]  PHQ-2 Total Score 3  PHQ-9 Total Score 14      Flowsheet Row ED from 04/22/2022 in [facility] Emergency Department at [facility] ED from 03/08/2021 in [facility] at [facility]) ED from 02/22/2021 in [facility] at [facility]  C-SSRS RISK CATEGORY No Risk No Risk No Risk        Review of Systems:  Review of Systems  Musculoskeletal:   Negative for gait problem.  Neurological:  Negative for tremors.  Psychiatric/Behavioral:         Please refer to HPI    Medications: I have reviewed the patient's current medications.  Current Outpatient Medications  Medication Sig Dispense Refill   ALPRAZolam (XANAX) 1 MG tablet Take 1 tablet (1 mg total) by mouth 2 (two) times daily as needed for anxiety. 180 tablet 0   amphetamine-dextroamphetamine (ADDERALL XR) 25 MG 24 hr capsule Take 2 capsules by mouth daily after breakfast. 180 capsule 0   desvenlafaxine (PRISTIQ) 100 MG 24 hr tablet Take 1 tablet (100 mg total) by mouth daily. 90 tablet 1   Multiple Vitamin (ONE-DAILY MULTI-VITAMIN) TABS Take 1 tablet by mouth daily.     No current facility-administered medications for this visit.    Medication Side Effects: None  Allergies:  Allergies  Allergen Reactions   Clarithromycin Other (See Comments) and Rash    Vasculitis Vasculitis  Other reaction(s): Unknown   Penicillins Other (See Comments)    Other reaction(s): Intolerance vasculitis vasculitis    Penicillin G     Other reaction(s): Unknown    Past Medical History:  Diagnosis Date   Acute non-recurrent maxillary sinusitis 07/12/2017   ADHD (attention deficit hyperactivity disorder)    Anxiety    Cough 07/12/2017   Diaphoresis    Dysmenorrhea    IUD (intrauterine device) in place    Obesity (BMI 30.0-34.9)    Obsessive compulsive disorder     Past Medical History, Surgical history, Social history, and Family history were reviewed and updated as appropriate.  Please see review of systems for further details on the patient's review from today.   Objective:   Physical Exam:  There were no vitals taken for this visit.  Physical Exam Constitutional:      General: She is not in acute distress. Musculoskeletal:        General: No deformity.  Neurological:     Mental Status: She is alert and oriented to person, place, and time.     Coordination:  Coordination normal.  Psychiatric:        Attention and Perception: Attention and perception normal. She does not perceive auditory or visual hallucinations.        Mood and Affect: Mood normal. Mood is not anxious or depressed. Affect is not labile, blunt, angry or inappropriate.        Speech: Speech normal.        Behavior: Behavior normal.        Thought Content: Thought content normal. Thought content is not paranoid or delusional. Thought content does not include homicidal or suicidal ideation. Thought content does not include homicidal or suicidal plan.        Cognition and Memory: Cognition and memory normal.        Judgment: Judgment normal.     Comments: Insight intact     Lab Review:     Component Value Date/Time   NA 140 05/14/2021 1049   K 4.6 05/14/2021 1049   CL 101 05/14/2021 1049   CO2 22 05/14/2021 1049   GLUCOSE 90 05/14/2021 1049   GLUCOSE 95 11/14/2020 1007   BUN 12 05/14/2021 1049   CREATININE 0.70 05/14/2021 1049   CALCIUM 10.0 05/14/2021 1049   PROT 7.7 05/14/2021 1049   ALBUMIN 5.1 (H) 05/14/2021 1049   AST 17 05/14/2021 1049   ALT 13 05/14/2021 1049   ALKPHOS 76 05/14/2021 1049   BILITOT 0.2 05/14/2021 1049   GFRNONAA >60 11/14/2020 1007       Component Value Date/Time   WBC 7.5 05/14/2021 1049   WBC 10.5 11/02/2020 0828   RBC 4.85 05/14/2021 1049   RBC 4.28 11/02/2020 0828   HGB 15.1 05/14/2021 1049   HCT 44.1 05/14/2021 1049   PLT 256 11/02/2020 0828   MCV 91 05/14/2021 1049   MCH 31.1 05/14/2021 1049   MCH 30.4 11/02/2020 0828   MCHC 34.2 05/14/2021 1049   MCHC 34.1 11/02/2020 0828   RDW 12.6 05/14/2021 1049   LYMPHSABS 2.3 05/14/2021 1049   MONOABS 0.6 11/02/2020 0828   EOSABS 0.0 05/14/2021 1049   BASOSABS 0.0 05/14/2021 1049    No results found for: "POCLITH", "LITHIUM"   No results found for: "PHENYTOIN", "PHENOBARB", "VALPROATE", "CBMZ"   .res Assessment: Plan:    Plan:  PDMP reviewed  Continue:  Pristiq 100mg  every  morning Adderall XR 25mg  capsule - 2 daily  Adderall 20mg  daily - no longer taking Xanax 1mg  BID - no longer taking  125/87/89 - will monitor between visits  RTC 3 months  Patient advised to contact office with any questions, adverse effects, or acute worsening in signs and symptoms.  Discussed potential benefits, risk, and side effects of benzodiazepines to include potential risk of tolerance and dependence, as well as possible drowsiness.  Advised patient not to drive if experiencing drowsiness and to take lowest possible effective dose to minimize risk of dependence and tolerance.  Discussed potential benefits, risks, and side effects of stimulants with patient to include increased heart rate, palpitations, insomnia, increased anxiety, increased  irritability, or decreased appetite.  Instructed patient to contact office if experiencing any significant tolerability issues.  Diagnoses and all orders for this visit:  Attention deficit hyperactivity disorder (ADHD), combined type, moderate -     amphetamine-dextroamphetamine (ADDERALL XR) 25 MG 24 hr capsule; Take 2 capsules by mouth daily after breakfast.  Generalized anxiety disorder -     desvenlafaxine (PRISTIQ) 100 MG 24 hr tablet; Take 1 tablet (100 mg total) by mouth daily.  Mixed obsessional thoughts and acts -     desvenlafaxine (PRISTIQ) 100 MG 24 hr tablet; Take 1 tablet (100 mg total) by mouth daily.     Please see After Visit Summary for patient specific instructions.  Future Appointments  Date Time Provider Department Center  03/26/2023  5:00 PM Mathis Fare, LCSW [facility] None  04/16/2023  5:00 PM Mathis Fare, LCSW [facility] None  05/07/2023  5:00 PM Mathis Fare, LCSW [facility] None  06/04/2023  5:00 PM Mathis Fare, LCSW [facility] None  07/02/2023  4:40 PM Song Garris, Thereasa Solo, NP [facility] None  07/02/2023  5:00 PM Mathis Fare, LCSW [facility] None  08/05/2023  5:00 PM Mathis Fare, LCSW [facility] None  09/03/2023  5:00 PM Mathis Fare, LCSW [facility] None    No orders of the defined types were placed in this encounter.   -------------------------------

## 2023-04-02 ENCOUNTER — Ambulatory Visit: Payer: BC Managed Care – PPO | Admitting: Adult Health

## 2023-04-16 ENCOUNTER — Ambulatory Visit: Payer: BC Managed Care – PPO | Admitting: Psychiatry

## 2023-04-16 DIAGNOSIS — F411 Generalized anxiety disorder: Secondary | ICD-10-CM | POA: Diagnosis not present

## 2023-04-16 NOTE — Progress Notes (Signed)
[facility] Counselor/Therapist Progress Note  Patient ID: Mariah Young, MRN: [id number],    Date: 04/16/2023  Time Spent: 58 minutes   Treatment Type: Individual Therapy  Reported Symptoms: anxiety   Mental Status Exam:  Appearance:   Casual     Behavior:  Appropriate, Sharing, and Motivated  Motor:  Normal  Speech/Language:   Clear and Coherent  Affect:  anxious  Mood:  anxious  Thought process:  goal directed  Thought content:    WNL  Sensory/Perceptual disturbances:    WNL  Orientation:  oriented to person, place, time/date, situation, day of week, month of year, year, and stated date of Sept. 25, 2024  Attention:  Good  Concentration:  Good  Memory:  WNL  Fund of knowledge:   Good  Insight:    Good  Judgment:   Good  Impulse Control:  Good   Risk Assessment: Danger to Self:  No Self-injurious Behavior: No Danger to Others: No Duty to Warn:no Physical Aggression / Violence:No  Access to Firearms a concern: No  Gang Involvement:No   Subjective:   Patient showing good motivation in session today as she continues working on her anxiety, job related issues, personal life issues, managing feelings of uncertainty and overwhelmingness, stress management, and better acceptance of herself. Lots happening in work environment which has created more stress and indecision for patient. Shared and processed her concerns re: work changes, potential opportunities, and some decision-making patient is considering.  Discussed some coping strategies for managing additional stressors and really thinking through some of the options she is considering. Processes her anxieties in more detail and supported her in her thoroughness, and in continuing to look at her situation from different angles, rather than jumping to conclusions. Trust issuesConcerned and wanting to make best decision. Also worked on some strategies to help "calm" her feelings of being overwhelmed, and healthy  decision-making. Was more calm and motivated by end of session. To give more attention to positive self-care and encouraging self-talk as noted in session today.   Interventions: Cognitive Behavioral Therapy and Ego-Supportive  Long term goal:  1. Stabilize anxiety level while increasing ability to function on a daily basis.  2. Patient will eventually progress to where she rates her anxiety as a "3" or less on "1-10 Anxiety scale" for at least 2 months. Short term goal: Increase understanding of beliefs and messages that produce anxiety, worry,fear, and negativity.  Strategies: Identify, challenge, and replace anxious/fearful/negative with positive, hopeful, and empoweriing self-talk.  Diagnosis:   ICD-10-CM   1. 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  She is continuing to make progress and needs to proceed further in her work with goal-directed behaviors so as to keep moving and a healthier and positive direction. Reminded and encouraged patient to be practicing more positive and self affirming behaviors as noted in sessions including: Practicing more patience with herself and with others, continue working on adjusting more easily when things do not go as planned or are out of her control, remain in the present focusing on what she can control, refrain from jumping ahead and assuming worst-case scenarios, spending time with her dog and BF which is very therapeutic for her, believing more in herself and the changes that she can continue to make and move  forward, use of positive self talk, and recognize the strength she shows working with goal-directed behaviors to move in a direction that supports her improved emotional health and outlook into the future.  Goal review and  progress/challenges noted with patient.  Next appointment within 3 weeks.   Mathis Fare, LCSW

## 2023-05-07 ENCOUNTER — Ambulatory Visit: Payer: BC Managed Care – PPO | Admitting: Psychiatry

## 2023-05-07 DIAGNOSIS — F411 Generalized anxiety disorder: Secondary | ICD-10-CM

## 2023-05-07 NOTE — Progress Notes (Signed)
[facility] Counselor/Therapist Progress Note  Patient ID: Mariah Young, MRN: [id number],    Date: 05/07/2023  Time Spent: 58 minutes   Treatment Type: Individual Therapy  Reported Symptoms: frustration, "feeling I may be slipping more into deeper depression", No SI  Mental Status Exam:  Appearance:   Casual     Behavior:  Appropriate, Sharing, and Motivated  Motor:  Normal  Speech/Language:   Clear and Coherent  Affect:  anxious  Mood:  anxious  Thought process:  goal directed  Thought content:    WNL  Sensory/Perceptual disturbances:    WNL  Orientation:  oriented to person, place, time/date, situation, day of week, month of year, year, and stated date of Oct. 16, 2024  Attention:  Good  Concentration:  Good  Memory:  WNL  Fund of knowledge:   Good  Insight:    Good  Judgment:   Good  Impulse Control:  Good   Risk Assessment: Danger to Self:  No Self-injurious Behavior: No Danger to Others: No Duty to Warn:no Physical Aggression / Violence:No  Access to Firearms a concern: No  Gang Involvement:No   Subjective: Patient in today with news of a change she is making. Has resigned previous job and accepted a position with another competitor that seems like a really good opportunity for patient, and will also put her in same city as her BF and they have been planning to eventually marry. Patient very happy about new job and being able to move to where BF is living. Currently upbeat and excited about her new job and moving, but also admits some nervousness right now. Processed all the changes she has gone through most recently, in addition to her work and living changes coming up soon.  Also processed her stress of the upcoming transfer. Good motivation. Anxious but looking forward to new job. Discussed her following up in therapy once she gets relocated and connected with a therapist in the area where she will be living and working.   Interventions: Cognitive  Behavioral Therapy and Ego-Supportive  Long term goal:  1. Stabilize anxiety level while increasing ability to function on a daily basis.  2. Patient will eventually progress to where she rates her anxiety as a "3" or less on "1-10 Anxiety scale" for at least 2 months. Short term goal: Increase understanding of beliefs and messages that produce anxiety, worry,fear, and negativity.  Strategies: Identify, challenge, and replace anxious/fearful/negative with positive, hopeful, and empoweriing self-talk.  Diagnosis:   ICD-10-CM   1. Generalized anxiety disorder  F41.1      Plan: Patient continues to work hard on her treatment goals and is progressing.  She will be relocating out of town for new job and plans to get settled there and then decide what is needed as far as additional therapy, and I plan to send her information on some therapist in the area which she will be living.  Is actually doing quite well currently has maintained some of the progress she has made.  Is a very stressful time for her due to the moving a lot of things involved in her personal situation but is feeling confident and motivated for this next chapter in her life.  She has made progress and I have encouraged her to continue as needed in her new location as she continues to practice some of the gains she has made with goal-directed behaviors so as to keep moving in a healthier and positive direction.  Goal review  and progress/challenges noted with patient.  No further appointment being scheduled here due to the fact she is relocating as she got a new job that we will start soon.   Mathis Fare, LCSW

## 2023-05-12 ENCOUNTER — Telehealth: Payer: Self-pay | Admitting: Adult Health

## 2023-05-12 NOTE — Telephone Encounter (Signed)
Per [facility]- Insurance requires a PA for 2 per day-  Max Limit of 90 capsules within 75 days at a time. Needs a PA for 180/90 - Phone #[phone]  , ID #:[id number],RX [phone] with [employer].

## 2023-05-16 NOTE — Telephone Encounter (Signed)
Not sure which medication is requested but I will check.

## 2023-06-04 ENCOUNTER — Ambulatory Visit: Payer: BC Managed Care – PPO | Admitting: Psychiatry

## 2023-07-02 ENCOUNTER — Ambulatory Visit: Payer: BC Managed Care – PPO | Admitting: Psychiatry

## 2023-07-02 ENCOUNTER — Ambulatory Visit: Payer: BC Managed Care – PPO | Admitting: Adult Health

## 2023-08-05 ENCOUNTER — Ambulatory Visit: Payer: BC Managed Care – PPO | Admitting: Psychiatry

## 2023-09-03 ENCOUNTER — Ambulatory Visit: Payer: BC Managed Care – PPO | Admitting: Psychiatry

## 2023-10-01 ENCOUNTER — Ambulatory Visit: Payer: BC Managed Care – PPO | Admitting: Psychiatry

## 2023-11-01 ENCOUNTER — Other Ambulatory Visit: Payer: Self-pay | Admitting: Adult Health

## 2023-11-01 DIAGNOSIS — F422 Mixed obsessional thoughts and acts: Secondary | ICD-10-CM

## 2023-11-01 DIAGNOSIS — F411 Generalized anxiety disorder: Secondary | ICD-10-CM
